# Patient Record
Sex: Male | Born: 1990 | Race: White | Hispanic: No | Marital: Single | Smoking: Current some day smoker
Health system: Southern US, Community
[De-identification: ages and names within clinical notes are randomized; demographics above are authoritative.]

## PROBLEM LIST (undated history)

## (undated) DIAGNOSIS — F1910 Other psychoactive substance abuse, uncomplicated: Secondary | ICD-10-CM

---
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
## 2022-01-08 ENCOUNTER — Emergency Department (HOSPITAL_COMMUNITY): Payer: Medicaid Other

## 2022-01-08 ENCOUNTER — Inpatient Hospital Stay (HOSPITAL_COMMUNITY)
Admission: EM | Admit: 2022-01-08 | Discharge: 2022-01-23 | DRG: 853 | Disposition: A | Discharge status: Home Health | Payer: Medicaid Other | Attending: Internal Medicine | Admitting: Internal Medicine

## 2022-01-08 ENCOUNTER — Inpatient Hospital Stay (HOSPITAL_COMMUNITY): Payer: Medicaid Other | Admitting: Certified Registered Nurse Anesthetist

## 2022-01-08 ENCOUNTER — Inpatient Hospital Stay (HOSPITAL_COMMUNITY): Payer: Medicaid Other

## 2022-01-08 ENCOUNTER — Other Ambulatory Visit: Payer: Self-pay

## 2022-01-08 ENCOUNTER — Encounter (HOSPITAL_COMMUNITY): Payer: Self-pay

## 2022-01-08 ENCOUNTER — Encounter (HOSPITAL_COMMUNITY)
Admission: EM | Disposition: A | Discharge status: Home Health | Payer: Self-pay | Source: Home / Self Care | Attending: Internal Medicine

## 2022-01-08 DIAGNOSIS — I2489 Other forms of acute ischemic heart disease: Secondary | ICD-10-CM | POA: Diagnosis present

## 2022-01-08 DIAGNOSIS — R6510 Systemic inflammatory response syndrome (SIRS) of non-infectious origin without acute organ dysfunction: Secondary | ICD-10-CM | POA: Diagnosis present

## 2022-01-08 DIAGNOSIS — M79A11 Nontraumatic compartment syndrome of right upper extremity: Secondary | ICD-10-CM | POA: Diagnosis not present

## 2022-01-08 DIAGNOSIS — M6282 Rhabdomyolysis: Secondary | ICD-10-CM | POA: Diagnosis not present

## 2022-01-08 DIAGNOSIS — R778 Other specified abnormalities of plasma proteins: Secondary | ICD-10-CM | POA: Diagnosis present

## 2022-01-08 DIAGNOSIS — E875 Hyperkalemia: Secondary | ICD-10-CM | POA: Diagnosis not present

## 2022-01-08 DIAGNOSIS — R7401 Elevation of levels of liver transaminase levels: Secondary | ICD-10-CM | POA: Diagnosis not present

## 2022-01-08 DIAGNOSIS — G928 Other toxic encephalopathy: Secondary | ICD-10-CM | POA: Diagnosis present

## 2022-01-08 DIAGNOSIS — G9341 Metabolic encephalopathy: Secondary | ICD-10-CM | POA: Diagnosis present

## 2022-01-08 DIAGNOSIS — E8809 Other disorders of plasma-protein metabolism, not elsewhere classified: Secondary | ICD-10-CM | POA: Diagnosis present

## 2022-01-08 DIAGNOSIS — N179 Acute kidney failure, unspecified: Secondary | ICD-10-CM | POA: Diagnosis not present

## 2022-01-08 DIAGNOSIS — F1910 Other psychoactive substance abuse, uncomplicated: Secondary | ICD-10-CM | POA: Diagnosis not present

## 2022-01-08 DIAGNOSIS — A419 Sepsis, unspecified organism: Principal | ICD-10-CM | POA: Diagnosis present

## 2022-01-08 DIAGNOSIS — N5089 Other specified disorders of the male genital organs: Secondary | ICD-10-CM | POA: Diagnosis present

## 2022-01-08 DIAGNOSIS — E872 Acidosis, unspecified: Secondary | ICD-10-CM | POA: Diagnosis not present

## 2022-01-08 DIAGNOSIS — L03119 Cellulitis of unspecified part of limb: Secondary | ICD-10-CM

## 2022-01-08 DIAGNOSIS — Y838 Other surgical procedures as the cause of abnormal reaction of the patient, or of later complication, without mention of misadventure at the time of the procedure: Secondary | ICD-10-CM | POA: Diagnosis not present

## 2022-01-08 DIAGNOSIS — J690 Pneumonitis due to inhalation of food and vomit: Secondary | ICD-10-CM | POA: Diagnosis present

## 2022-01-08 DIAGNOSIS — T40411A Poisoning by fentanyl or fentanyl analogs, accidental (unintentional), initial encounter: Secondary | ICD-10-CM | POA: Diagnosis present

## 2022-01-08 DIAGNOSIS — E8770 Fluid overload, unspecified: Secondary | ICD-10-CM | POA: Diagnosis present

## 2022-01-08 DIAGNOSIS — F17210 Nicotine dependence, cigarettes, uncomplicated: Secondary | ICD-10-CM

## 2022-01-08 DIAGNOSIS — N289 Disorder of kidney and ureter, unspecified: Secondary | ICD-10-CM

## 2022-01-08 DIAGNOSIS — E876 Hypokalemia: Secondary | ICD-10-CM | POA: Diagnosis present

## 2022-01-08 DIAGNOSIS — T79A11A Traumatic compartment syndrome of right upper extremity, initial encounter: Secondary | ICD-10-CM

## 2022-01-08 DIAGNOSIS — E871 Hypo-osmolality and hyponatremia: Secondary | ICD-10-CM | POA: Diagnosis present

## 2022-01-08 DIAGNOSIS — J40 Bronchitis, not specified as acute or chronic: Secondary | ICD-10-CM | POA: Diagnosis present

## 2022-01-08 DIAGNOSIS — N170 Acute kidney failure with tubular necrosis: Secondary | ICD-10-CM | POA: Diagnosis present

## 2022-01-08 DIAGNOSIS — D649 Anemia, unspecified: Secondary | ICD-10-CM | POA: Diagnosis present

## 2022-01-08 DIAGNOSIS — Y92009 Unspecified place in unspecified non-institutional (private) residence as the place of occurrence of the external cause: Secondary | ICD-10-CM

## 2022-01-08 DIAGNOSIS — R030 Elevated blood-pressure reading, without diagnosis of hypertension: Secondary | ICD-10-CM | POA: Diagnosis not present

## 2022-01-08 DIAGNOSIS — B1920 Unspecified viral hepatitis C without hepatic coma: Secondary | ICD-10-CM | POA: Diagnosis present

## 2022-01-08 DIAGNOSIS — I9752 Accidental puncture and laceration of a circulatory system organ or structure during other procedure: Secondary | ICD-10-CM | POA: Diagnosis not present

## 2022-01-08 DIAGNOSIS — R509 Fever, unspecified: Secondary | ICD-10-CM | POA: Diagnosis not present

## 2022-01-08 DIAGNOSIS — R7989 Other specified abnormal findings of blood chemistry: Secondary | ICD-10-CM | POA: Diagnosis present

## 2022-01-08 DIAGNOSIS — M79604 Pain in right leg: Secondary | ICD-10-CM | POA: Diagnosis not present

## 2022-01-08 DIAGNOSIS — Z1152 Encounter for screening for COVID-19: Secondary | ICD-10-CM

## 2022-01-08 DIAGNOSIS — L03115 Cellulitis of right lower limb: Secondary | ICD-10-CM | POA: Diagnosis present

## 2022-01-08 DIAGNOSIS — I248 Other forms of acute ischemic heart disease: Secondary | ICD-10-CM | POA: Diagnosis not present

## 2022-01-08 DIAGNOSIS — S41101A Unspecified open wound of right upper arm, initial encounter: Secondary | ICD-10-CM | POA: Diagnosis not present

## 2022-01-08 DIAGNOSIS — T79A0XA Compartment syndrome, unspecified, initial encounter: Secondary | ICD-10-CM | POA: Diagnosis present

## 2022-01-08 DIAGNOSIS — F1990 Other psychoactive substance use, unspecified, uncomplicated: Secondary | ICD-10-CM

## 2022-01-08 DIAGNOSIS — R29898 Other symptoms and signs involving the musculoskeletal system: Secondary | ICD-10-CM | POA: Diagnosis not present

## 2022-01-08 DIAGNOSIS — S60428A Blister (nonthermal) of other finger, initial encounter: Secondary | ICD-10-CM | POA: Diagnosis present

## 2022-01-08 HISTORY — DX: Other psychoactive substance abuse, uncomplicated: F19.10

## 2022-01-08 HISTORY — PX: FASCIECTOMY: SHX6525

## 2022-01-08 LAB — CBC WITH DIFFERENTIAL/PLATELET
Abs Immature Granulocytes: 0.62 10*3/uL — ABNORMAL HIGH (ref 0.00–0.07)
Basophils Absolute: 0.1 10*3/uL (ref 0.0–0.1)
Basophils Relative: 0 %
Eosinophils Absolute: 0 10*3/uL (ref 0.0–0.5)
Eosinophils Relative: 0 %
HCT: 54.9 % — ABNORMAL HIGH (ref 39.0–52.0)
Hemoglobin: 19 g/dL — ABNORMAL HIGH (ref 13.0–17.0)
Immature Granulocytes: 2 %
Lymphocytes Relative: 2 %
Lymphs Abs: 0.6 10*3/uL — ABNORMAL LOW (ref 0.7–4.0)
MCH: 30.9 pg (ref 26.0–34.0)
MCHC: 34.6 g/dL (ref 30.0–36.0)
MCV: 89.4 fL (ref 80.0–100.0)
Monocytes Absolute: 1.3 10*3/uL — ABNORMAL HIGH (ref 0.1–1.0)
Monocytes Relative: 5 %
Neutro Abs: 23.6 10*3/uL — ABNORMAL HIGH (ref 1.7–7.7)
Neutrophils Relative %: 91 %
Platelets: 290 10*3/uL (ref 150–400)
RBC: 6.14 MIL/uL — ABNORMAL HIGH (ref 4.22–5.81)
RDW: 13.1 % (ref 11.5–15.5)
WBC Morphology: INCREASED
WBC: 26.2 10*3/uL — ABNORMAL HIGH (ref 4.0–10.5)
nRBC: 0 % (ref 0.0–0.2)

## 2022-01-08 LAB — COMPREHENSIVE METABOLIC PANEL
ALT: 318 U/L — ABNORMAL HIGH (ref 0–44)
AST: 1191 U/L — ABNORMAL HIGH (ref 15–41)
Albumin: 3.4 g/dL — ABNORMAL LOW (ref 3.5–5.0)
Alkaline Phosphatase: 153 U/L — ABNORMAL HIGH (ref 38–126)
Anion gap: 12 (ref 5–15)
BUN: 42 mg/dL — ABNORMAL HIGH (ref 6–20)
CO2: 16 mmol/L — ABNORMAL LOW (ref 22–32)
Calcium: 7.9 mg/dL — ABNORMAL LOW (ref 8.9–10.3)
Chloride: 106 mmol/L (ref 98–111)
Creatinine, Ser: 1.83 mg/dL — ABNORMAL HIGH (ref 0.61–1.24)
GFR, Estimated: 51 mL/min — ABNORMAL LOW (ref >60)
Glucose, Bld: 147 mg/dL — ABNORMAL HIGH (ref 70–99)
Potassium: 6.1 mmol/L — ABNORMAL HIGH (ref 3.5–5.1)
Sodium: 134 mmol/L — ABNORMAL LOW (ref 135–145)
Total Bilirubin: 0.7 mg/dL (ref 0.3–1.2)
Total Protein: 7.8 g/dL (ref 6.5–8.1)

## 2022-01-08 LAB — POCT I-STAT 7, (LYTES, BLD GAS, ICA,H+H)
Acid-base deficit: 6 mmol/L — ABNORMAL HIGH (ref 0.0–2.0)
Bicarbonate: 20.1 mmol/L (ref 20.0–28.0)
Calcium, Ion: 1.06 mmol/L — ABNORMAL LOW (ref 1.15–1.40)
HCT: 43 % (ref 39.0–52.0)
Hemoglobin: 14.6 g/dL (ref 13.0–17.0)
O2 Saturation: 99 %
Potassium: 5.1 mmol/L (ref 3.5–5.1)
Sodium: 135 mmol/L (ref 135–145)
TCO2: 21 mmol/L — ABNORMAL LOW (ref 22–32)
pCO2 arterial: 41.6 mmHg (ref 32–48)
pH, Arterial: 7.292 — ABNORMAL LOW (ref 7.35–7.45)
pO2, Arterial: 158 mmHg — ABNORMAL HIGH (ref 83–108)

## 2022-01-08 LAB — URINALYSIS, ROUTINE W REFLEX MICROSCOPIC
Bilirubin Urine: NEGATIVE
Glucose, UA: NEGATIVE mg/dL
Ketones, ur: NEGATIVE mg/dL
Leukocytes,Ua: NEGATIVE
Nitrite: NEGATIVE
Protein, ur: 100 mg/dL — AB
Specific Gravity, Urine: 1.018 (ref 1.005–1.030)
pH: 6 (ref 5.0–8.0)

## 2022-01-08 LAB — GLUCOSE, CAPILLARY: Glucose-Capillary: 144 mg/dL — ABNORMAL HIGH (ref 70–99)

## 2022-01-08 LAB — APTT: aPTT: 27 seconds (ref 24–36)

## 2022-01-08 LAB — RAPID URINE DRUG SCREEN, HOSP PERFORMED
Amphetamines: NOT DETECTED
Barbiturates: NOT DETECTED
Benzodiazepines: NOT DETECTED
Cocaine: NOT DETECTED
Opiates: NOT DETECTED
Tetrahydrocannabinol: NOT DETECTED

## 2022-01-08 LAB — ETHANOL: Alcohol, Ethyl (B): <10 mg/dL (ref <10)

## 2022-01-08 LAB — BLOOD GAS, VENOUS
Acid-base deficit: 6.8 mmol/L — ABNORMAL HIGH (ref 0.0–2.0)
Bicarbonate: 16.9 mmol/L — ABNORMAL LOW (ref 20.0–28.0)
O2 Saturation: 94.8 %
Patient temperature: 37
pCO2, Ven: 30 mmHg — ABNORMAL LOW (ref 44–60)
pH, Ven: 7.36 (ref 7.25–7.43)
pO2, Ven: 68 mmHg — ABNORMAL HIGH (ref 32–45)

## 2022-01-08 LAB — LACTIC ACID, PLASMA
Lactic Acid, Venous: 4.1 mmol/L (ref 0.5–1.9)
Lactic Acid, Venous: 2.9 mmol/L (ref 0.5–1.9)

## 2022-01-08 LAB — RESP PANEL BY RT-PCR (FLU A&B, COVID) ARPGX2
Influenza A by PCR: NEGATIVE
Influenza B by PCR: NEGATIVE
SARS Coronavirus 2 by RT PCR: NEGATIVE

## 2022-01-08 LAB — TROPONIN I (HIGH SENSITIVITY)
Troponin I (High Sensitivity): 6918 ng/L (ref <18)
Troponin I (High Sensitivity): 9935 ng/L (ref <18)

## 2022-01-08 LAB — CBG MONITORING, ED: Glucose-Capillary: 119 mg/dL — ABNORMAL HIGH (ref 70–99)

## 2022-01-08 LAB — PROTIME-INR
INR: 1.2 (ref 0.8–1.2)
Prothrombin Time: 15.4 seconds — ABNORMAL HIGH (ref 11.4–15.2)

## 2022-01-08 LAB — CK: Total CK: >50000 U/L — ABNORMAL HIGH (ref 49–397)

## 2022-01-08 LAB — MAGNESIUM: Magnesium: 2 mg/dL (ref 1.7–2.4)

## 2022-01-08 LAB — LIPASE, BLOOD: Lipase: 35 U/L (ref 11–51)

## 2022-01-08 SURGERY — FASCIECTOMY, PALM
Anesthesia: Monitor Anesthesia Care | Site: Arm Lower | Laterality: Right

## 2022-01-08 SURGERY — Surgical Case
Anesthesia: *Unknown

## 2022-01-08 MED ORDER — LACTATED RINGERS IV SOLN: INTRAVENOUS | Status: DC

## 2022-01-08 MED ORDER — SODIUM CHLORIDE (PF) 0.9 % IJ SOLN
INTRAMUSCULAR | Status: AC
  Filled 2022-01-08: qty 50

## 2022-01-08 MED ORDER — SODIUM CHLORIDE 0.9 % IV SOLN
2.0000 g | Freq: Once | INTRAVENOUS | Status: AC
  Administered 2022-01-08: 2 g via INTRAVENOUS
  Filled 2022-01-08: qty 12.5

## 2022-01-08 MED ORDER — DEXTROSE 50 % IV SOLN
INTRAVENOUS | Status: AC
  Filled 2022-01-08: qty 50

## 2022-01-08 MED ORDER — INSULIN ASPART 100 UNIT/ML IJ SOLN
5.0000 [IU] | Freq: Once | INTRAMUSCULAR | Status: AC
  Administered 2022-01-08: 5 [IU] via INTRAVENOUS
  Filled 2022-01-08: qty 0.05

## 2022-01-08 MED ORDER — SODIUM ZIRCONIUM CYCLOSILICATE 10 G PO PACK
10.0000 g | PACK | Freq: Once | ORAL | Status: AC
  Administered 2022-01-08: 10 g via ORAL
  Filled 2022-01-08: qty 1

## 2022-01-08 MED ORDER — PHENYLEPHRINE HCL-NACL 20-0.9 MG/250ML-% IV SOLN
INTRAVENOUS | Status: AC
  Filled 2022-01-08: qty 250

## 2022-01-08 MED ORDER — OXYCODONE HCL 5 MG PO TABS: 5.0000 mg | ORAL_TABLET | Freq: Once | ORAL | Status: DC | PRN

## 2022-01-08 MED ORDER — LACTATED RINGERS IV BOLUS
1000.0000 mL | Freq: Once | INTRAVENOUS | Status: AC
  Administered 2022-01-08: 1000 mL via INTRAVENOUS

## 2022-01-08 MED ORDER — SODIUM CHLORIDE 0.9 % IV SOLN: Freq: Once | INTRAVENOUS | Status: AC

## 2022-01-08 MED ORDER — PROPOFOL 500 MG/50ML IV EMUL
INTRAVENOUS | Status: DC | PRN
  Administered 2022-01-08: 50 ug/kg/min via INTRAVENOUS

## 2022-01-08 MED ORDER — METRONIDAZOLE 500 MG/100ML IV SOLN
500.0000 mg | Freq: Once | INTRAVENOUS | Status: AC
  Administered 2022-01-08: 500 mg via INTRAVENOUS
  Filled 2022-01-08: qty 100

## 2022-01-08 MED ORDER — ACETAMINOPHEN 325 MG PO TABS
650.0000 mg | ORAL_TABLET | Freq: Four times a day (QID) | ORAL | Status: DC | PRN
  Administered 2022-01-10 – 2022-01-21 (×10): 650 mg via ORAL
  Filled 2022-01-08 (×11): qty 2

## 2022-01-08 MED ORDER — DEXMEDETOMIDINE HCL IN NACL 200 MCG/50ML IV SOLN
0.4000 ug/kg/h | INTRAVENOUS | Status: DC
  Administered 2022-01-08: .6 ug/kg/h via INTRAVENOUS
  Filled 2022-01-08: qty 50

## 2022-01-08 MED ORDER — INSULIN ASPART 100 UNIT/ML IJ SOLN
INTRAMUSCULAR | Status: AC
  Filled 2022-01-08: qty 1

## 2022-01-08 MED ORDER — HYDROMORPHONE HCL 1 MG/ML IJ SOLN: 0.2500 mg | INTRAMUSCULAR | Status: DC | PRN

## 2022-01-08 MED ORDER — OXYCODONE HCL 5 MG/5ML PO SOLN: 5.0000 mg | Freq: Once | ORAL | Status: DC | PRN

## 2022-01-08 MED ORDER — LACTATED RINGERS IV BOLUS (SEPSIS)
2500.0000 mL | Freq: Once | INTRAVENOUS | Status: AC
  Administered 2022-01-08: 2500 mL via INTRAVENOUS

## 2022-01-08 MED ORDER — NALOXONE HCL 0.4 MG/ML IJ SOLN: 0.4000 mg | INTRAMUSCULAR | Status: DC | PRN

## 2022-01-08 MED ORDER — ONDANSETRON HCL 4 MG/2ML IJ SOLN: 4.0000 mg | Freq: Four times a day (QID) | INTRAMUSCULAR | Status: DC | PRN

## 2022-01-08 MED ORDER — LIDOCAINE-EPINEPHRINE (PF) 1.5 %-1:200000 IJ SOLN
INTRAMUSCULAR | Status: DC | PRN
  Administered 2022-01-08: 20 mL via PERINEURAL

## 2022-01-08 MED ORDER — SODIUM CHLORIDE 0.9 % IR SOLN
Status: DC | PRN
  Administered 2022-01-08: 1000 mL

## 2022-01-08 MED ORDER — ACETAMINOPHEN 650 MG RE SUPP: 650.0000 mg | Freq: Four times a day (QID) | RECTAL | Status: DC | PRN

## 2022-01-08 MED ORDER — KETAMINE HCL 10 MG/ML IJ SOLN
INTRAMUSCULAR | Status: DC | PRN
  Administered 2022-01-08: 30 mg via INTRAVENOUS

## 2022-01-08 MED ORDER — PROPOFOL 500 MG/50ML IV EMUL
INTRAVENOUS | Status: AC
  Filled 2022-01-08: qty 50

## 2022-01-08 MED ORDER — IOHEXOL 350 MG/ML SOLN
75.0000 mL | Freq: Once | INTRAVENOUS | Status: AC | PRN
  Administered 2022-01-08: 75 mL via INTRAVENOUS

## 2022-01-08 MED ORDER — HYDROMORPHONE HCL 1 MG/ML IJ SOLN
0.5000 mg | INTRAMUSCULAR | Status: DC | PRN
  Administered 2022-01-09 (×2): 0.5 mg via INTRAVENOUS
  Filled 2022-01-08 (×3): qty 1

## 2022-01-08 MED ORDER — FENTANYL CITRATE PF 50 MCG/ML IJ SOSY
PREFILLED_SYRINGE | INTRAMUSCULAR | Status: AC
  Filled 2022-01-08: qty 2

## 2022-01-08 MED ORDER — PROPOFOL 10 MG/ML IV BOLUS
INTRAVENOUS | Status: AC
  Filled 2022-01-08: qty 20

## 2022-01-08 MED ORDER — DEXTROSE 50 % IV SOLN
2.0000 | Freq: Once | INTRAVENOUS | Status: AC
  Administered 2022-01-08: 100 mL via INTRAVENOUS
  Filled 2022-01-08: qty 100

## 2022-01-08 MED ORDER — VANCOMYCIN HCL IN DEXTROSE 1-5 GM/200ML-% IV SOLN
1000.0000 mg | Freq: Once | INTRAVENOUS | Status: AC
  Administered 2022-01-08: 1000 mg via INTRAVENOUS
  Filled 2022-01-08: qty 200

## 2022-01-08 MED ORDER — DEXMEDETOMIDINE HCL IN NACL 80 MCG/20ML IV SOLN
INTRAVENOUS | Status: DC | PRN
  Administered 2022-01-08 (×4): 8 ug via BUCCAL

## 2022-01-08 MED ORDER — AMISULPRIDE (ANTIEMETIC) 5 MG/2ML IV SOLN: 10.0000 mg | Freq: Once | INTRAVENOUS | Status: DC | PRN

## 2022-01-08 MED ORDER — KETAMINE HCL 10 MG/ML IJ SOLN
INTRAMUSCULAR | Status: AC
  Filled 2022-01-08: qty 1

## 2022-01-08 MED ORDER — SODIUM CHLORIDE 0.9 % IV BOLUS
1000.0000 mL | Freq: Once | INTRAVENOUS | Status: AC
  Administered 2022-01-08: 1000 mL via INTRAVENOUS

## 2022-01-08 MED ORDER — ONDANSETRON HCL 4 MG/2ML IJ SOLN: 4.0000 mg | Freq: Once | INTRAMUSCULAR | Status: DC | PRN

## 2022-01-08 MED ORDER — FENTANYL CITRATE (PF) 100 MCG/2ML IJ SOLN
INTRAMUSCULAR | Status: DC | PRN
  Administered 2022-01-08 (×2): 50 ug via INTRAVENOUS

## 2022-01-08 SURGICAL SUPPLY — 38 items
BLADE SURG 15 STRL LF DISP TIS (BLADE) ×1 IMPLANT
BLADE SURG 15 STRL SS (BLADE) ×1
BNDG ELASTIC 4X5.8 VLCR STR LF (GAUZE/BANDAGES/DRESSINGS) IMPLANT
BNDG ESMARK 4X9 LF (GAUZE/BANDAGES/DRESSINGS) ×1 IMPLANT
BNDG GAUZE DERMACEA FLUFF 4 (GAUZE/BANDAGES/DRESSINGS) ×1 IMPLANT
BNDG PLASTER X FAST 3X3 WHT LF (CAST SUPPLIES) IMPLANT
CHLORAPREP W/TINT 26 (MISCELLANEOUS) ×1 IMPLANT
CORD BIPOLAR FORCEPS 12FT (ELECTRODE) ×1 IMPLANT
COVER BACK TABLE 60X90IN (DRAPES) ×1 IMPLANT
COVER MAYO STAND STRL (DRAPES) ×1 IMPLANT
CUFF TOURN SGL QUICK 18X4 (TOURNIQUET CUFF) IMPLANT
DRAPE EXTREMITY T 121X128X90 (DISPOSABLE) ×1 IMPLANT
DRAPE SURG 17X23 STRL (DRAPES) ×1 IMPLANT
DRSG ADAPTIC 3X8 NADH LF (GAUZE/BANDAGES/DRESSINGS) IMPLANT
DRSG EMULSION OIL 3X16 NADH (GAUZE/BANDAGES/DRESSINGS) IMPLANT
DRSG VAC GRANUFOAM LG (GAUZE/BANDAGES/DRESSINGS) IMPLANT
GAUZE SPONGE 4X4 12PLY STRL (GAUZE/BANDAGES/DRESSINGS) IMPLANT
GLOVE BIO SURGEON STRL SZ7 (GLOVE) ×1 IMPLANT
GLOVE BIOGEL PI IND STRL 7.0 (GLOVE) ×1 IMPLANT
GOWN STRL REUS W/ TWL XL LVL3 (GOWN DISPOSABLE) ×1 IMPLANT
GOWN STRL REUS W/TWL XL LVL3 (GOWN DISPOSABLE) ×3
NDL HYPO 25X1 1.5 SAFETY (NEEDLE) IMPLANT
NEEDLE HYPO 25X1 1.5 SAFETY (NEEDLE) IMPLANT
PACK BASIN DAY SURGERY FS (CUSTOM PROCEDURE TRAY) ×1 IMPLANT
PAD CAST 3X4 CTTN HI CHSV (CAST SUPPLIES) IMPLANT
PAD CAST 4YDX4 CTTN HI CHSV (CAST SUPPLIES) IMPLANT
PADDING CAST COTTON 3X4 STRL (CAST SUPPLIES)
PADDING CAST COTTON 4X4 STRL (CAST SUPPLIES) ×1
SLEEVE SCD COMPRESS KNEE MED (STOCKING) IMPLANT
SPLINT FIBERGLASS 3X35 (CAST SUPPLIES) ×1 IMPLANT
SUT ETHILON 2 0 PSLX (SUTURE) IMPLANT
SUT ETHILON 4 0 PS 2 18 (SUTURE) ×1 IMPLANT
SUT MNCRL AB 3-0 PS2 18 (SUTURE) IMPLANT
SUT VICRYL 4-0 PS2 18IN ABS (SUTURE) IMPLANT
SYR BULB EAR ULCER 3OZ GRN STR (SYRINGE) ×1 IMPLANT
SYR CONTROL 10ML LL (SYRINGE) IMPLANT
TOWEL GREEN STERILE FF (TOWEL DISPOSABLE) ×2 IMPLANT
UNDERPAD 30X36 HEAVY ABSORB (UNDERPADS AND DIAPERS) ×1 IMPLANT

## 2022-01-08 NOTE — Anesthesia Postprocedure Evaluation (Signed)
Anesthesia Post Note  Patient: Filbert Craze  Procedure(s) Performed: FASCIECTOMY OF FOREARM AND HAND (Right: Arm Lower)     Patient location during evaluation: PACU Anesthesia Type: MAC and Regional Level of consciousness: awake and alert Pain management: pain level controlled Vital Signs Assessment: post-procedure vital signs reviewed and stable Respiratory status: spontaneous breathing, nonlabored ventilation and respiratory function stable Cardiovascular status: blood pressure returned to baseline and stable Postop Assessment: no apparent nausea or vomiting Anesthetic complications: no   No notable events documented.  Last Vitals:  Vitals:   01/08/22 2145 01/08/22 2345  BP: (!) 161/95 115/78  Pulse: (!) 108 82  Resp: (!) 24 17  Temp:  36.8 C  SpO2: 97% 99%    Last Pain:  Vitals:   01/08/22 1656  TempSrc: Oral  PainSc:                  Pervis Hocking

## 2022-01-08 NOTE — Transfer of Care (Signed)
Immediate Anesthesia Transfer of Care Note  Patient: Chris Parker  Procedure(s) Performed: FASCIECTOMY OF FOREARM AND HAND (Right: Arm Lower)  Patient Location: PACU  Anesthesia Type:MAC combined with regional for post-op pain  Level of Consciousness: awake, drowsy and patient cooperative  Airway & Oxygen Therapy: Patient Spontanous Breathing and Patient connected to face mask oxygen  Post-op Assessment: Report given to RN and Post -op Vital signs reviewed and stable  Post vital signs: Reviewed and stable  Last Vitals:  Vitals Value Taken Time  BP 115/78 01/08/22 2341  Temp    Pulse 85 01/08/22 2343  Resp 19 01/08/22 2343  SpO2 99 % 01/08/22 2343  Vitals shown include unvalidated device data.  Last Pain:  Vitals:   01/08/22 1656  TempSrc: Oral  PainSc:          Complications: No notable events documented.

## 2022-01-08 NOTE — Anesthesia Procedure Notes (Signed)
Anesthesia Regional Block: Supraclavicular block   Pre-Anesthetic Checklist: , timeout performed,  Correct Patient, Correct Site, Correct Laterality,  Correct Procedure, Correct Position, site marked,  Risks and benefits discussed,  Surgical consent,  Pre-op evaluation,  At surgeon's request and post-op pain management  Laterality: Right  Prep: Maximum Sterile Barrier Precautions used, chloraprep       Needles:  Injection technique: Single-shot  Needle Type: Echogenic Stimulator Needle     Needle Length: 9cm  Needle Gauge: 22     Additional Needles:   Procedures:,,,, ultrasound used (permanent image in chart),,    Narrative:  Start time: 01/08/2022 9:51 PM End time: 01/08/2022 9:56 PM Injection made incrementally with aspirations every 5 mL.  Performed by: Personally  Anesthesiologist: Pervis Hocking, DO  Additional Notes: Monitors applied. No increased pain on injection. No increased resistance to injection. Injection made in 5cc increments. Good needle visualization. Patient tolerated procedure well.

## 2022-01-08 NOTE — Anesthesia Procedure Notes (Signed)
Arterial Line Insertion Start/End9/22/2023 10:00 PM, 01/08/2022 10:05 PM Performed by: Pervis Hocking, DO, anesthesiologist  Patient location: OR. Preanesthetic checklist: patient identified, IV checked, site marked, risks and benefits discussed, surgical consent, monitors and equipment checked, pre-op evaluation, timeout performed and anesthesia consent Lidocaine 1% used for infiltration Left, radial was placed Catheter size: 20 G Hand hygiene performed  and maximum sterile barriers used   Attempts: 1 Procedure performed without using ultrasound guided technique. Following insertion, dressing applied. Post procedure assessment: normal and unchanged  Patient tolerated the procedure well with no immediate complications.

## 2022-01-08 NOTE — Sepsis Progress Note (Signed)
Notified bedside nurse of need to draw repeat lactic acid. 

## 2022-01-08 NOTE — Interval H&P Note (Signed)
History and Physical Interval Note:  01/08/2022 9:58 PM  Chris Parker  has presented today for surgery, with the diagnosis of COMPARTMENT SYNDROME  RIGHT ARM.  The various methods of treatment have been discussed with the patient and family. After consideration of risks, benefits and other options for treatment, the patient has consented to  Procedure(s) with comments: FASCIECTOMY (Right) - RIGHT HAND AND FOREARM as a surgical intervention.  The patient's history has been reviewed, patient examined, no change in status, stable for surgery.  I have reviewed the patient's chart and labs.  Questions were answered to the patient's satisfaction.     Amauri Keefe Tyden Kann
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
## 2022-01-08 NOTE — H&P (View-Only) (Signed)
 HAND SURGERY CONSULTATION  REQUESTING PHYSICIAN: Schlossman, Erin, MD   Chief Complaint: Unable to obtain from patient  HPI: Chris Parker is a 31 y.o. male who presents with pain, swelling, and erythema involving the right forearm and hand.  Patient was reportedly found down by his family and brought to the [facility] ER by EMS.  By report, it sounds like he may have overdosed and was lying on the right side of his body for an unknown period of time. His mother saw him at 11PM on Thursday evening and found him down around 2 PM this afternoon. He is significantly altered in the ER and does not follow commands or provide any history.  Hand dominance: Unknown Occupation: Unknown  History reviewed. No pertinent past medical history. History reviewed. No pertinent surgical history. Social History   Socioeconomic History   Marital status: Single    Spouse name: Not on file   Number of children: Not on file   Years of education: Not on file   Highest education level: Not on file  Occupational History   Not on file  Tobacco Use   Smoking status: Not on file   Smokeless tobacco: Not on file  Substance and Sexual Activity   Alcohol use: Not on file   Drug use: Not on file   Sexual activity: Not on file  Other Topics Concern   Not on file  Social History Narrative   Not on file   Social Determinants of Health   Financial Resource Strain: Not on file  Food Insecurity: Not on file  Transportation Needs: Not on file  Physical Activity: Not on file  Stress: Not on file  Social Connections: Not on file   History reviewed. No pertinent family history. - negative except otherwise stated in the family history section No Known Allergies Prior to Admission medications   Not on File   DG Hand Complete Left  Result Date: 01/08/2022 CLINICAL DATA:  Hand swelling EXAM: LEFT HAND - COMPLETE 3+ VIEW COMPARISON:  None Available. FINDINGS: There is no evidence of fracture or dislocation. There is  no evidence of arthropathy or other focal bone abnormality. Soft tissues are unremarkable. IMPRESSION: Negative. Electronically Signed   By: Nicholas  Plundo D.O.   On: 01/08/2022 18:28   DG Hand Complete Right  Result Date: 01/08/2022 CLINICAL DATA:  Right hand swelling with cutaneous blisters EXAM: RIGHT HAND - COMPLETE 3+ VIEW COMPARISON:  None Available. FINDINGS: There is no evidence of fracture or dislocation. No erosion or periosteal elevation. There is no evidence of arthropathy or other focal bone abnormality. Generalized soft tissue swelling. No soft tissue gas. IMPRESSION: 1. No acute osseous abnormality. 2. Generalized soft tissue swelling. Electronically Signed   By: Nicholas  Plundo D.O.   On: 01/08/2022 18:28   DG Chest Port 1 View  Result Date: 01/08/2022 CLINICAL DATA:  Possible sepsis.  Overdose EXAM: PORTABLE CHEST 1 VIEW COMPARISON:  None Available. FINDINGS: The heart size and mediastinal contours are within normal limits. Diffuse interstitial and alveolar opacities throughout the right lung. Relatively mild interstitial prominence within the left lung. No pleural effusion or pneumothorax. The visualized skeletal structures are unremarkable. IMPRESSION: Diffuse airspace opacities throughout the right lung with mild interstitial prominence within the left lung. Findings may represent multifocal pneumonia or asymmetric edema. Electronically Signed   By: Nicholas  Plundo D.O.   On: 01/08/2022 18:27   - Positive ROS: All other systems have been reviewed and were otherwise negative with the exception   of those mentioned in the HPI and as above.  Physical Exam: General: Agitated, lying on stretcher, not following commands or providing history Cardiovascular: BUE warm and well perfused, tachycardic Respiratory: Normal WOB on RA Skin: Warm and dry Neurologic: Unable to obtain   Right Upper Extremity Erythema involving ulnar aspect of forearm Blistering around dorsal and volar wrist,  patient was reportedly wearing a watch when he came in  Volar forearm is very tense Fingers held in claw position extended at MCP and flexed at PIP and DIP joints Will not actively move his fingers or wrist Unable to obtain sensory exam Hand warm and well perfused w/ BCR and bounding radial pulse    Assessment: 31 yo M w/ presumed right forearm and hand compartment after unknown event.  Might have been down for prolonged period secondary to overdose.   Plan: Will plan for emergent fasciotomies of the right forearm and hand as soon as possible.  Patient currently being worked up for potentially life threatening conditions.  Will discuss case with OR and send for patient as soon as he is stable.   Thank you for the consult and the opportunity to see Mr. Chris Parker, M.D. [facility] 7:29 PM

## 2022-01-08 NOTE — Sepsis Progress Note (Signed)
Confirmed via secure chat with bedside RN that blood culture was drawn before the antibiotic was given.
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
## 2022-01-08 NOTE — Progress Notes (Signed)
A consult was received from an ED physician for vancomycin and cefepime per pharmacy dosing.  The patient's profile has been reviewed for ht/wt/allergies/indication/available labs.   A one time order has been placed for vancomycin 1g and cefepime 2g.  Further antibiotics/pharmacy consults should be ordered by admitting physician if indicated.                       Thank you, Peggyann Juba, PharmD, BCPS 01/08/2022  5:17 PM
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
## 2022-01-08 NOTE — Brief Op Note (Signed)
01/08/2022  11:32 PM  PATIENT:  Chris Parker  31 y.o. male  PRE-OPERATIVE DIAGNOSIS:  COMPARTMENT SYNDROME  RIGHT ARM  POST-OPERATIVE DIAGNOSIS:  COMPARTMENT SYNDROME  RIGHT ARM  PROCEDURE:  Procedure(s) with comments: FASCIECTOMY OF FOREARM AND HAND (Right) - RIGHT HAND AND FOREARM  SURGEON:  Surgeon(s) and Role:    * Sherilyn Cooter, MD - Primary  PHYSICIAN ASSISTANT:   ASSISTANTS: none   ANESTHESIA:   regional and MAC  EBL:  10 mL   BLOOD ADMINISTERED:none  DRAINS:  Wound Vac, R forearm    LOCAL MEDICATIONS USED:  None   SPECIMEN:  No Specimen  DISPOSITION OF SPECIMEN:  N/A  COUNTS:  YES  TOURNIQUET:   Total Tourniquet Time Documented: Upper Arm (Right) - 56 minutes Total: Upper Arm (Right) - 56 minutes   DICTATION: .Viviann Spare Dictation  PLAN OF CARE: Discharge to home after PACU  PATIENT DISPOSITION:  PACU - hemodynamically stable.   Delay start of Pharmacological VTE agent (>24hrs) due to surgical blood loss or risk of bleeding: not applicable  Continue WVAC at 125 mmHg NWB RUE Will likely return to OR on Sunday or Monday for repeat I&D w/ possible closure versus repeat [facility]  Sherilyn Cooter, M.D. [facility]

## 2022-01-08 NOTE — Consult Note (Signed)
HAND SURGERY CONSULTATION  REQUESTING PHYSICIAN: Gareth Morgan, MD   Chief Complaint: Unable to obtain from patient  HPI: Chris Parker is a 31 y.o. male who presents with pain, swelling, and erythema involving the right forearm and hand.  Patient was reportedly found down by his family and brought to the [facility] ER by EMS.  By report, it sounds like he may have overdosed and was lying on the right side of his body for an unknown period of time. His mother saw him at 11PM on Thursday evening and found him down around 2 PM this afternoon. He is significantly altered in the ER and does not follow commands or provide any history.  Hand dominance: Unknown Occupation: Unknown  History reviewed. No pertinent past medical history. History reviewed. No pertinent surgical history. Social History   Socioeconomic History   Marital status: Single    Spouse name: Not on file   Number of children: Not on file   Years of education: Not on file   Highest education level: Not on file  Occupational History   Not on file  Tobacco Use   Smoking status: Not on file   Smokeless tobacco: Not on file  Substance and Sexual Activity   Alcohol use: Not on file   Drug use: Not on file   Sexual activity: Not on file  Other Topics Concern   Not on file  Social History Narrative   Not on file   Social Determinants of Health   Financial Resource Strain: Not on file  Food Insecurity: Not on file  Transportation Needs: Not on file  Physical Activity: Not on file  Stress: Not on file  Social Connections: Not on file   History reviewed. No pertinent family history. - negative except otherwise stated in the family history section No Known Allergies Prior to Admission medications   Not on File   DG Hand Complete Left  Result Date: 01/08/2022 CLINICAL DATA:  Hand swelling EXAM: LEFT HAND - COMPLETE 3+ VIEW COMPARISON:  None Available. FINDINGS: There is no evidence of fracture or dislocation. There is  no evidence of arthropathy or other focal bone abnormality. Soft tissues are unremarkable. IMPRESSION: Negative. Electronically Signed   By: Davina Poke D.O.   On: 01/08/2022 18:28   DG Hand Complete Right  Result Date: 01/08/2022 CLINICAL DATA:  Right hand swelling with cutaneous blisters EXAM: RIGHT HAND - COMPLETE 3+ VIEW COMPARISON:  None Available. FINDINGS: There is no evidence of fracture or dislocation. No erosion or periosteal elevation. There is no evidence of arthropathy or other focal bone abnormality. Generalized soft tissue swelling. No soft tissue gas. IMPRESSION: 1. No acute osseous abnormality. 2. Generalized soft tissue swelling. Electronically Signed   By: Davina Poke D.O.   On: 01/08/2022 18:28   DG Chest Port 1 View  Result Date: 01/08/2022 CLINICAL DATA:  Possible sepsis.  Overdose EXAM: PORTABLE CHEST 1 VIEW COMPARISON:  None Available. FINDINGS: The heart size and mediastinal contours are within normal limits. Diffuse interstitial and alveolar opacities throughout the right lung. Relatively mild interstitial prominence within the left lung. No pleural effusion or pneumothorax. The visualized skeletal structures are unremarkable. IMPRESSION: Diffuse airspace opacities throughout the right lung with mild interstitial prominence within the left lung. Findings may represent multifocal pneumonia or asymmetric edema. Electronically Signed   By: Davina Poke D.O.   On: 01/08/2022 18:27   - Positive ROS: All other systems have been reviewed and were otherwise negative with the exception  of those mentioned in the HPI and as above.  Physical Exam: General: Agitated, lying on stretcher, not following commands or providing history Cardiovascular: BUE warm and well perfused, tachycardic Respiratory: Normal WOB on RA Skin: Warm and dry Neurologic: Unable to obtain   Right Upper Extremity Erythema involving ulnar aspect of forearm Blistering around dorsal and volar wrist,  patient was reportedly wearing a watch when he came in  Volar forearm is very tense Fingers held in claw position extended at MCP and flexed at PIP and DIP joints Will not actively move his fingers or wrist Unable to obtain sensory exam Hand warm and well perfused w/ BCR and bounding radial pulse    Assessment: 31 yo M w/ presumed right forearm and hand compartment after unknown event.  Might have been down for prolonged period secondary to overdose.   Plan: Will plan for emergent fasciotomies of the right forearm and hand as soon as possible.  Patient currently being worked up for potentially life threatening conditions.  Will discuss case with OR and send for patient as soon as he is stable.   Thank you for the consult and the opportunity to see Mr. Chris Parker, M.D. [facility] 7:29 PM

## 2022-01-08 NOTE — ED Notes (Signed)
Family at bedside. 

## 2022-01-08 NOTE — H&P (View-Only) (Signed)
HAND SURGERY CONSULTATION  REQUESTING PHYSICIAN: Gareth Morgan, MD   Chief Complaint: Unable to obtain from patient  HPI: Chris Parker is a 31 y.o. male who presents with pain, swelling, and erythema involving the right forearm and hand.  Patient was reportedly found agitated and altered by his family and brought to the [facility] ER by EMS.  By report, it sounds like he may have overdosed and was lying on the right side of his body for an unknown period of time.  He is significantly altered in the ER and does not follow commands or provide any history.  Hand dominance: Unknown Occupation: Unknown  History reviewed. No pertinent past medical history. History reviewed. No pertinent surgical history. Social History   Socioeconomic History   Marital status: Single    Spouse name: Not on file   Number of children: Not on file   Years of education: Not on file   Highest education level: Not on file  Occupational History   Not on file  Tobacco Use   Smoking status: Not on file   Smokeless tobacco: Not on file  Substance and Sexual Activity   Alcohol use: Not on file   Drug use: Not on file   Sexual activity: Not on file  Other Topics Concern   Not on file  Social History Narrative   Not on file   Social Determinants of Health   Financial Resource Strain: Not on file  Food Insecurity: Not on file  Transportation Needs: Not on file  Physical Activity: Not on file  Stress: Not on file  Social Connections: Not on file   History reviewed. No pertinent family history. - negative except otherwise stated in the family history section No Known Allergies Prior to Admission medications   Not on File   DG Hand Complete Left  Result Date: 01/08/2022 CLINICAL DATA:  Hand swelling EXAM: LEFT HAND - COMPLETE 3+ VIEW COMPARISON:  None Available. FINDINGS: There is no evidence of fracture or dislocation. There is no evidence of arthropathy or other focal bone abnormality. Soft tissues are  unremarkable. IMPRESSION: Negative. Electronically Signed   By: Davina Poke D.O.   On: 01/08/2022 18:28   DG Hand Complete Right  Result Date: 01/08/2022 CLINICAL DATA:  Right hand swelling with cutaneous blisters EXAM: RIGHT HAND - COMPLETE 3+ VIEW COMPARISON:  None Available. FINDINGS: There is no evidence of fracture or dislocation. No erosion or periosteal elevation. There is no evidence of arthropathy or other focal bone abnormality. Generalized soft tissue swelling. No soft tissue gas. IMPRESSION: 1. No acute osseous abnormality. 2. Generalized soft tissue swelling. Electronically Signed   By: Davina Poke D.O.   On: 01/08/2022 18:28   DG Chest Port 1 View  Result Date: 01/08/2022 CLINICAL DATA:  Possible sepsis.  Overdose EXAM: PORTABLE CHEST 1 VIEW COMPARISON:  None Available. FINDINGS: The heart size and mediastinal contours are within normal limits. Diffuse interstitial and alveolar opacities throughout the right lung. Relatively mild interstitial prominence within the left lung. No pleural effusion or pneumothorax. The visualized skeletal structures are unremarkable. IMPRESSION: Diffuse airspace opacities throughout the right lung with mild interstitial prominence within the left lung. Findings may represent multifocal pneumonia or asymmetric edema. Electronically Signed   By: Davina Poke D.O.   On: 01/08/2022 18:27   - Positive ROS: All other systems have been reviewed and were otherwise negative with the exception of those mentioned in the HPI and as above.  Physical Exam: General: Agitated, lying  on stretcher, not following commands or providing history Cardiovascular: BUE warm and well perfused, tachycardic Respiratory: Normal WOB on RA Skin: Warm and dry Neurologic: Unable to obtain   Right Upper Extremity Erythema involving ulnar aspect of forearm Blistering around dorsal and volar wrist, patient was reportedly wearing a watch when he came in  Volar forearm is  very tense Fingers held in claw position extended at MCP and flexed at PIP and DIP joints Will not actively move his fingers or wrist Unable to obtain sensory exam Hand warm and well perfused w/ BCR and bounding radial pulse    Assessment: 31 yo M w/ presumed right forearm and hand compartment after unknown event.  Might have been down for prolonged period secondary to overdose.   Plan: Will plan for emergent fasciotomies of the right forearm and hand as soon as possible.  Patient currently being worked up for potentially life threatening conditions.  Will discuss case with OR and send for patient as soon as he is stable.   Thank you for the consult and the opportunity to see Chris Parker, M.D. [facility] 7:29 PM

## 2022-01-08 NOTE — H&P (Signed)
History and Physical    PLEASE NOTE THAT DRAGON DICTATION SOFTWARE WAS USED IN THE CONSTRUCTION OF THIS NOTE.   Dat Chris VHQ:[id number] DOB: 12/19/1990 DOA: 01/08/2022  PCP: Patient, No Pcp Per *** Patient coming from: home ***  I have personally briefly reviewed patient's old medical records in [facility]  Chief Complaint: ***  HPI: Chris Parker is a 31 y.o. male with medical history significant for *** who is admitted to [facility] on 01/08/2022 with *** after presenting from home*** to [facility] ED complaining of ***.    ***    ***SOB: Denies any associated orthopnea, PND, or new onset peripheral edema. No recent chest pain, diaphoresis, palpitations, N/Parker, pre-syncope, or syncope. Not associated with any recent cough, wheezing, hemoptysis, new lower extremity erythema, or calf tenderness. Denies any recent trauma, travel, surgical procedures, or periods of prolonged diminished ambulatory status. No recent melena or hematochezia.   Denies any associated subjective fever, chills, rigors, or generalized myalgias. No recent headache, neck stiffness, rhinitis, rhinorrhea, sore throat, abdominal pain, diarrhea, or rash. No known recent COVID-19 exposures. Denies dysuria, gross hematuria, or change in urinary urgency/frequency.  ***   ***misc/infectious: Denies any subjective fever, chills, rigors, or generalized myalgias. Denies any recent headache, neck stiffness, rhinitis, rhinorrhea, sore throat, sob, wheezing, cough, nausea, vomiting, abdominal pain, diarrhea, or rash. No recent traveling or known COVID-19 exposures. Denies dysuria, gross hematuria, or change in urinary urgency/frequency.  Denies any recent chest pain, diaphoresis, or palpitations. ***    ED Course:  Vital signs in the ED were notable for the following: ***  Labs were notable for the following: ***  Imaging and additional notable ED work-up: ***  While in the ED, the following were administered:  ***  Subsequently, the patient was admitted  ***  ***red   Review of Systems: As per HPI otherwise 10 point review of systems negative.   History reviewed. No pertinent past medical history.  History reviewed. No pertinent surgical history.  Social History:  has no history on file for tobacco use, alcohol use, and drug use.   No Known Allergies  History reviewed. No pertinent family history.  Family history reviewed and not pertinent ***   Prior to Admission medications   Not on File     Objective    Physical Exam: Vitals:   01/08/22 1656 01/08/22 1715 01/08/22 1836 01/08/22 1842  BP: (!) 135/93 (!) 152/101  138/88  Pulse:  (!) 133  (!) 122  Resp: 20 (!) 23  17  Temp: 100.1 F (37.8 C)     TempSrc: Oral     SpO2: 100% 100%  100%  Weight:   76.2 kg   Height:   5\' 9"  (1.753 m)     General: appears to be stated age; alert, oriented Skin: warm, dry, no rash Head:  AT/[place] Mouth:  Oral mucosa membranes appear moist, normal dentition Neck: supple; trachea midline Heart:  RRR; did not appreciate any M/R/G Lungs: CTAB, did not appreciate any wheezes, rales, or rhonchi Abdomen: + BS; soft, ND, NT Vascular: 2+ pedal pulses b/l; 2+ radial pulses b/l Extremities: no peripheral edema, no muscle wasting Neuro: strength and sensation intact in upper and lower extremities b/l    *** Neuro: 5/5 strength of the proximal and distal flexors and extensors of the upper and lower extremities bilaterally; sensation intact in upper and lower extremities b/l; cranial nerves II through XII grossly intact; no pronator drift; no evidence suggestive of slurred speech,  dysarthria, or facial droop; Normal muscle tone. No tremors. *** Neuro: In the setting of the patient's current mental status and associated inability to follow instructions, unable to perform full neurologic exam at this time.  As such, assessment of strength, sensation, and cranial nerves is limited at this time.  Patient noted to spontaneously move all 4 extremities. No tremors.  ***    Labs on Admission: I have personally reviewed following labs and imaging studies  CBC: Recent Labs  Lab 01/08/22 1739  WBC 26.2*  NEUTROABS 23.6*  HGB 19.0*  HCT 54.9*  MCV 89.4  PLT 290   Basic Metabolic Panel: Recent Labs  Lab 01/08/22 1739  NA 134*  K 6.1*  CL 106  CO2 16*  GLUCOSE 147*  BUN 42*  CREATININE 1.83*  CALCIUM 7.9*   GFR: Estimated Creatinine Clearance: 60.1 mL/min (A) (by C-G formula based on SCr of 1.83 mg/dL (H)). Liver Function Tests: Recent Labs  Lab 01/08/22 1739  AST 1,191*  ALT 318*  ALKPHOS 153*  BILITOT 0.7  PROT 7.8  ALBUMIN 3.4*   Recent Labs  Lab 01/08/22 1739  LIPASE 35   No results for input(s): "AMMONIA" in the last 168 hours. Coagulation Profile: Recent Labs  Lab 01/08/22 1739  INR 1.2   Cardiac Enzymes: Recent Labs  Lab 01/08/22 1739  CKTOTAL >50,000*   BNP (last 3 results) No results for input(s): "PROBNP" in the last 8760 hours. HbA1C: No results for input(s): "HGBA1C" in the last 72 hours. CBG: Recent Labs  Lab 01/08/22 1959  GLUCAP 119*   Lipid Profile: No results for input(s): "CHOL", "HDL", "LDLCALC", "TRIG", "CHOLHDL", "LDLDIRECT" in the last 72 hours. Thyroid Function Tests: No results for input(s): "TSH", "T4TOTAL", "FREET4", "T3FREE", "THYROIDAB" in the last 72 hours. Anemia Panel: No results for input(s): "VITAMINB12", "FOLATE", "FERRITIN", "TIBC", "IRON", "RETICCTPCT" in the last 72 hours. Urine analysis:    Component Value Date/Time   COLORURINE AMBER (A) 01/08/2022 1709   APPEARANCEUR HAZY (A) 01/08/2022 1709   LABSPEC 1.018 01/08/2022 1709   PHURINE 6.0 01/08/2022 1709   GLUCOSEU NEGATIVE 01/08/2022 1709   HGBUR LARGE (A) 01/08/2022 1709   BILIRUBINUR NEGATIVE 01/08/2022 1709   KETONESUR NEGATIVE 01/08/2022 1709   PROTEINUR 100 (A) 01/08/2022 1709   NITRITE NEGATIVE 01/08/2022 1709   LEUKOCYTESUR NEGATIVE  01/08/2022 1709    Radiological Exams on Admission: DG Hand Complete Left  Result Date: 01/08/2022 CLINICAL DATA:  Hand swelling EXAM: LEFT HAND - COMPLETE 3+ VIEW COMPARISON:  None Available. FINDINGS: There is no evidence of fracture or dislocation. There is no evidence of arthropathy or other focal bone abnormality. Soft tissues are unremarkable. IMPRESSION: Negative. Electronically Signed   By: Duanne Guess D.O.   On: 01/08/2022 18:28   DG Hand Complete Right  Result Date: 01/08/2022 CLINICAL DATA:  Right hand swelling with cutaneous blisters EXAM: RIGHT HAND - COMPLETE 3+ VIEW COMPARISON:  None Available. FINDINGS: There is no evidence of fracture or dislocation. No erosion or periosteal elevation. There is no evidence of arthropathy or other focal bone abnormality. Generalized soft tissue swelling. No soft tissue gas. IMPRESSION: 1. No acute osseous abnormality. 2. Generalized soft tissue swelling. Electronically Signed   By: Duanne Guess D.O.   On: 01/08/2022 18:28   DG Chest Port 1 View  Result Date: 01/08/2022 CLINICAL DATA:  Possible sepsis.  Overdose EXAM: PORTABLE CHEST 1 VIEW COMPARISON:  None Available. FINDINGS: The heart size and mediastinal contours are within normal limits. Diffuse interstitial  and alveolar opacities throughout the right lung. Relatively mild interstitial prominence within the left lung. No pleural effusion or pneumothorax. The visualized skeletal structures are unremarkable. IMPRESSION: Diffuse airspace opacities throughout the right lung with mild interstitial prominence within the left lung. Findings may represent multifocal pneumonia or asymmetric edema. Electronically Signed   By: Davina Poke D.O.   On: 01/08/2022 18:27     EKG: Independently reviewed, with result as described above. ***   Assessment/Plan    Principal Problem:    Rhabdomyolysis  ***      ***          ***           ***          ***          ***          ***          ***          ***     ***  DVT prophylaxis: SCD's ***  Code Status: Full code*** Family Communication: none*** Disposition Plan: Per Rounding Team Consults called: none***;  Admission status: ***   PLEASE NOTE THAT DRAGON DICTATION SOFTWARE WAS USED IN THE CONSTRUCTION OF THIS NOTE.   [place] DO [facility] [place]   01/08/2022, 8:41 PM   ***

## 2022-01-08 NOTE — Sepsis Progress Note (Signed)
eLink is following this Code Sepsis. °

## 2022-01-08 NOTE — Anesthesia Preprocedure Evaluation (Addendum)
Anesthesia Evaluation  Patient identified by MRN, date of birth, ID band Patient awake    Reviewed: Allergy & Precautions, NPO status , Patient's Chart, lab work & pertinent test resultsPreop documentation limited or incomplete due to emergent nature of procedure.  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   Endo/Other  negative endocrine ROS  Renal/GU Renal diseaseCr 1.83  negative genitourinary   Musculoskeletal negative musculoskeletal ROS (+)   Abdominal   Peds negative pediatric ROS (+)  Hematology Hb 19   Anesthesia Other Findings   Reproductive/Obstetrics negative OB ROS                           Anesthesia Physical Anesthesia Plan  ASA: 4 and emergent  Anesthesia Plan: MAC and Regional   Post-op Pain Management: Regional block* and Precedex   Induction:   PONV Risk Score and Plan: 2 and Propofol infusion and TIVA  Airway Management Planned: Natural Airway and Simple Face Mask  Additional Equipment: None  Intra-op Plan:   Post-operative Plan:   Informed Consent: I have reviewed the patients History and Physical, chart, labs and discussed the procedure including the risks, benefits and alternatives for the proposed anesthesia with the patient or authorized representative who has indicated his/her  understanding and acceptance.     Dental advisory given  Plan Discussed with: CRNA  Anesthesia Plan Comments: (Called by surgeon regarding emergency case after having already discussed with cardiology and ED about patient being very high risk for arrhythmias and other complications under anesthesia. Will attempt to sedate pt for supraclavicular nerve block to allow Dr. Tempie Donning to perform fasciotomies; ideally would like to avoid GA.  GA/RSI with rocuronium as backup plan  )       Anesthesia Quick Evaluation

## 2022-01-09 ENCOUNTER — Inpatient Hospital Stay (HOSPITAL_COMMUNITY): Payer: Medicaid Other

## 2022-01-09 ENCOUNTER — Inpatient Hospital Stay (HOSPITAL_COMMUNITY): Payer: Medicaid Other | Admitting: Certified Registered Nurse Anesthetist

## 2022-01-09 ENCOUNTER — Encounter (HOSPITAL_COMMUNITY): Payer: Self-pay | Admitting: Orthopedic Surgery

## 2022-01-09 ENCOUNTER — Encounter (HOSPITAL_COMMUNITY)
Admission: EM | Disposition: A | Discharge status: Home Health | Payer: Self-pay | Source: Home / Self Care | Attending: Internal Medicine

## 2022-01-09 DIAGNOSIS — T79A11A Traumatic compartment syndrome of right upper extremity, initial encounter: Secondary | ICD-10-CM

## 2022-01-09 DIAGNOSIS — I248 Other forms of acute ischemic heart disease: Secondary | ICD-10-CM | POA: Diagnosis not present

## 2022-01-09 DIAGNOSIS — R6510 Systemic inflammatory response syndrome (SIRS) of non-infectious origin without acute organ dysfunction: Secondary | ICD-10-CM | POA: Diagnosis present

## 2022-01-09 DIAGNOSIS — F17210 Nicotine dependence, cigarettes, uncomplicated: Secondary | ICD-10-CM

## 2022-01-09 DIAGNOSIS — R778 Other specified abnormalities of plasma proteins: Secondary | ICD-10-CM | POA: Diagnosis not present

## 2022-01-09 DIAGNOSIS — N179 Acute kidney failure, unspecified: Secondary | ICD-10-CM | POA: Diagnosis present

## 2022-01-09 DIAGNOSIS — N289 Disorder of kidney and ureter, unspecified: Secondary | ICD-10-CM

## 2022-01-09 DIAGNOSIS — E872 Acidosis, unspecified: Secondary | ICD-10-CM | POA: Diagnosis present

## 2022-01-09 DIAGNOSIS — F1910 Other psychoactive substance abuse, uncomplicated: Secondary | ICD-10-CM | POA: Diagnosis present

## 2022-01-09 DIAGNOSIS — M6282 Rhabdomyolysis: Secondary | ICD-10-CM | POA: Diagnosis not present

## 2022-01-09 DIAGNOSIS — R7401 Elevation of levels of liver transaminase levels: Secondary | ICD-10-CM | POA: Diagnosis present

## 2022-01-09 DIAGNOSIS — T79A0XA Compartment syndrome, unspecified, initial encounter: Secondary | ICD-10-CM | POA: Diagnosis present

## 2022-01-09 DIAGNOSIS — E875 Hyperkalemia: Secondary | ICD-10-CM | POA: Diagnosis present

## 2022-01-09 HISTORY — PX: FASCIOTOMY: SHX132

## 2022-01-09 LAB — COMPREHENSIVE METABOLIC PANEL
ALT: 293 U/L — ABNORMAL HIGH (ref 0–44)
AST: 1085 U/L — ABNORMAL HIGH (ref 15–41)
Albumin: 2.4 g/dL — ABNORMAL LOW (ref 3.5–5.0)
Alkaline Phosphatase: 97 U/L (ref 38–126)
Anion gap: 5 (ref 5–15)
BUN: 42 mg/dL — ABNORMAL HIGH (ref 6–20)
CO2: 18 mmol/L — ABNORMAL LOW (ref 22–32)
Calcium: 6.6 mg/dL — ABNORMAL LOW (ref 8.9–10.3)
Chloride: 109 mmol/L (ref 98–111)
Creatinine, Ser: 1.6 mg/dL — ABNORMAL HIGH (ref 0.61–1.24)
GFR, Estimated: 60 mL/min — ABNORMAL LOW (ref >60)
Glucose, Bld: 150 mg/dL — ABNORMAL HIGH (ref 70–99)
Potassium: 5.7 mmol/L — ABNORMAL HIGH (ref 3.5–5.1)
Sodium: 132 mmol/L — ABNORMAL LOW (ref 135–145)
Total Bilirubin: 0.7 mg/dL (ref 0.3–1.2)
Total Protein: 5.4 g/dL — ABNORMAL LOW (ref 6.5–8.1)

## 2022-01-09 LAB — CBC WITH DIFFERENTIAL/PLATELET
Abs Immature Granulocytes: 0.14 10*3/uL — ABNORMAL HIGH (ref 0.00–0.07)
Basophils Absolute: 0 10*3/uL (ref 0.0–0.1)
Basophils Relative: 0 %
Eosinophils Absolute: 0.1 10*3/uL (ref 0.0–0.5)
Eosinophils Relative: 0 %
HCT: 44.7 % (ref 39.0–52.0)
Hemoglobin: 15.1 g/dL (ref 13.0–17.0)
Immature Granulocytes: 1 %
Lymphocytes Relative: 5 %
Lymphs Abs: 0.9 10*3/uL (ref 0.7–4.0)
MCH: 30.5 pg (ref 26.0–34.0)
MCHC: 33.8 g/dL (ref 30.0–36.0)
MCV: 90.3 fL (ref 80.0–100.0)
Monocytes Absolute: 1.1 10*3/uL — ABNORMAL HIGH (ref 0.1–1.0)
Monocytes Relative: 5 %
Neutro Abs: 17.6 10*3/uL — ABNORMAL HIGH (ref 1.7–7.7)
Neutrophils Relative %: 89 %
Platelets: 204 10*3/uL (ref 150–400)
RBC: 4.95 MIL/uL (ref 4.22–5.81)
RDW: 13.2 % (ref 11.5–15.5)
WBC: 19.8 10*3/uL — ABNORMAL HIGH (ref 4.0–10.5)
nRBC: 0 % (ref 0.0–0.2)

## 2022-01-09 LAB — ECHOCARDIOGRAM COMPLETE
AR max vel: 2.74 cm2
AV Area VTI: 2.51 cm2
AV Area mean vel: 2.55 cm2
AV Mean grad: 5 mmHg
AV Peak grad: 8.9 mmHg
Ao pk vel: 1.49 m/s
Area-P 1/2: 4.21 cm2
Calc EF: 56.5 %
Height: 69 in
S' Lateral: 3.4 cm
Single Plane A2C EF: 57.2 %
Single Plane A4C EF: 55.8 %
Weight: 2768.98 oz

## 2022-01-09 LAB — LACTIC ACID, PLASMA: Lactic Acid, Venous: 1.3 mmol/L (ref 0.5–1.9)

## 2022-01-09 LAB — BASIC METABOLIC PANEL
Anion gap: 6 (ref 5–15)
Anion gap: 7 (ref 5–15)
BUN: 47 mg/dL — ABNORMAL HIGH (ref 6–20)
BUN: 44 mg/dL — ABNORMAL HIGH (ref 6–20)
CO2: 19 mmol/L — ABNORMAL LOW (ref 22–32)
CO2: 19 mmol/L — ABNORMAL LOW (ref 22–32)
Calcium: 7 mg/dL — ABNORMAL LOW (ref 8.9–10.3)
Calcium: 6.6 mg/dL — ABNORMAL LOW (ref 8.9–10.3)
Chloride: 106 mmol/L (ref 98–111)
Chloride: 104 mmol/L (ref 98–111)
Creatinine, Ser: 1.8 mg/dL — ABNORMAL HIGH (ref 0.61–1.24)
Creatinine, Ser: 1.88 mg/dL — ABNORMAL HIGH (ref 0.61–1.24)
GFR, Estimated: 52 mL/min — ABNORMAL LOW (ref >60)
GFR, Estimated: 49 mL/min — ABNORMAL LOW (ref >60)
Glucose, Bld: 132 mg/dL — ABNORMAL HIGH (ref 70–99)
Glucose, Bld: 199 mg/dL — ABNORMAL HIGH (ref 70–99)
Potassium: 4.9 mmol/L (ref 3.5–5.1)
Potassium: 4.6 mmol/L (ref 3.5–5.1)
Sodium: 131 mmol/L — ABNORMAL LOW (ref 135–145)
Sodium: 130 mmol/L — ABNORMAL LOW (ref 135–145)

## 2022-01-09 LAB — PROCALCITONIN: Procalcitonin: 49.43 ng/mL

## 2022-01-09 LAB — CK
Total CK: >50000 U/L — ABNORMAL HIGH (ref 49–397)
Total CK: 20938 U/L — ABNORMAL HIGH (ref 49–397)

## 2022-01-09 LAB — CREATININE, URINE, RANDOM: Creatinine, Urine: 64 mg/dL

## 2022-01-09 LAB — ACETAMINOPHEN LEVEL: Acetaminophen (Tylenol), Serum: <10 ug/mL — ABNORMAL LOW (ref 10–30)

## 2022-01-09 LAB — PROTIME-INR
INR: 1.5 — ABNORMAL HIGH (ref 0.8–1.2)
Prothrombin Time: 17.6 seconds — ABNORMAL HIGH (ref 11.4–15.2)

## 2022-01-09 LAB — HIV ANTIBODY (ROUTINE TESTING W REFLEX): HIV Screen 4th Generation wRfx: NONREACTIVE

## 2022-01-09 LAB — SALICYLATE LEVEL: Salicylate Lvl: <7 mg/dL — ABNORMAL LOW (ref 7.0–30.0)

## 2022-01-09 LAB — MRSA NEXT GEN BY PCR, NASAL: MRSA by PCR Next Gen: NOT DETECTED

## 2022-01-09 LAB — SEDIMENTATION RATE: Sed Rate: 6 mm/hr (ref 0–16)

## 2022-01-09 LAB — SODIUM, URINE, RANDOM: Sodium, Ur: 33 mmol/L

## 2022-01-09 LAB — PHOSPHORUS: Phosphorus: 2.7 mg/dL (ref 2.5–4.6)

## 2022-01-09 LAB — C-REACTIVE PROTEIN: CRP: 9.6 mg/dL — ABNORMAL HIGH (ref <1.0)

## 2022-01-09 LAB — BRAIN NATRIURETIC PEPTIDE: B Natriuretic Peptide: 168.2 pg/mL — ABNORMAL HIGH (ref 0.0–100.0)

## 2022-01-09 LAB — MAGNESIUM: Magnesium: 2.1 mg/dL (ref 1.7–2.4)

## 2022-01-09 LAB — TROPONIN I (HIGH SENSITIVITY): Troponin I (High Sensitivity): 5231 ng/L (ref <18)

## 2022-01-09 SURGERY — FASCIOTOMY, UPPER EXTREMITY
Anesthesia: General | Laterality: Right

## 2022-01-09 MED ORDER — PROMETHAZINE HCL 25 MG/ML IJ SOLN: 6.2500 mg | INTRAMUSCULAR | Status: DC | PRN

## 2022-01-09 MED ORDER — ONDANSETRON HCL 4 MG/2ML IJ SOLN
INTRAMUSCULAR | Status: DC | PRN
  Administered 2022-01-09: 4 mg via INTRAVENOUS

## 2022-01-09 MED ORDER — OXYCODONE HCL 5 MG PO TABS
5.0000 mg | ORAL_TABLET | Freq: Four times a day (QID) | ORAL | Status: DC | PRN
  Administered 2022-01-09 – 2022-01-16 (×16): 5 mg via ORAL
  Filled 2022-01-09 (×17): qty 1

## 2022-01-09 MED ORDER — VANCOMYCIN HCL 1500 MG/300ML IV SOLN
1500.0000 mg | INTRAVENOUS | Status: DC
  Administered 2022-01-09: 1500 mg via INTRAVENOUS
  Filled 2022-01-09 (×2): qty 300

## 2022-01-09 MED ORDER — SODIUM ZIRCONIUM CYCLOSILICATE 10 G PO PACK
10.0000 g | PACK | Freq: Two times a day (BID) | ORAL | Status: DC
  Filled 2022-01-09: qty 1

## 2022-01-09 MED ORDER — SODIUM CHLORIDE 0.9 % IV SOLN
2.0000 g | Freq: Three times a day (TID) | INTRAVENOUS | Status: DC
  Administered 2022-01-09 – 2022-01-11 (×8): 2 g via INTRAVENOUS
  Filled 2022-01-09 (×8): qty 12.5

## 2022-01-09 MED ORDER — LIDOCAINE HCL (PF) 1 % IJ SOLN
INTRAMUSCULAR | Status: AC
  Filled 2022-01-09: qty 30

## 2022-01-09 MED ORDER — ORAL CARE MOUTH RINSE: 15.0000 mL | OROMUCOSAL | Status: DC | PRN

## 2022-01-09 MED ORDER — PROPOFOL 10 MG/ML IV BOLUS
INTRAVENOUS | Status: AC
  Filled 2022-01-09: qty 20

## 2022-01-09 MED ORDER — LIDOCAINE 2% (20 MG/ML) 5 ML SYRINGE
INTRAMUSCULAR | Status: DC | PRN
  Administered 2022-01-09: 80 mg via INTRAVENOUS

## 2022-01-09 MED ORDER — OXYCODONE HCL 5 MG/5ML PO SOLN: 5.0000 mg | Freq: Once | ORAL | Status: DC | PRN

## 2022-01-09 MED ORDER — OXYCODONE HCL 5 MG PO TABS
5.0000 mg | ORAL_TABLET | Freq: Once | ORAL | Status: AC
  Administered 2022-01-09: 5 mg via ORAL
  Filled 2022-01-09: qty 1

## 2022-01-09 MED ORDER — HYDROMORPHONE HCL 1 MG/ML IJ SOLN: 0.5000 mg | Freq: Once | INTRAMUSCULAR | Status: DC

## 2022-01-09 MED ORDER — FUROSEMIDE 10 MG/ML IJ SOLN
80.0000 mg | Freq: Two times a day (BID) | INTRAMUSCULAR | Status: DC
  Administered 2022-01-09 (×2): 80 mg via INTRAVENOUS
  Filled 2022-01-09 (×3): qty 8

## 2022-01-09 MED ORDER — MIDAZOLAM HCL 2 MG/2ML IJ SOLN
INTRAMUSCULAR | Status: AC
  Filled 2022-01-09: qty 2

## 2022-01-09 MED ORDER — PROPOFOL 10 MG/ML IV BOLUS
INTRAVENOUS | Status: DC | PRN
  Administered 2022-01-09: 200 mg via INTRAVENOUS

## 2022-01-09 MED ORDER — HYDROMORPHONE HCL 1 MG/ML IJ SOLN
INTRAMUSCULAR | Status: DC | PRN
  Administered 2022-01-09 (×2): 1 mg via INTRAVENOUS

## 2022-01-09 MED ORDER — 0.9 % SODIUM CHLORIDE (POUR BTL) OPTIME
TOPICAL | Status: DC | PRN
  Administered 2022-01-09: 1000 mL

## 2022-01-09 MED ORDER — HYDROMORPHONE HCL 1 MG/ML IJ SOLN
1.0000 mg | INTRAMUSCULAR | Status: DC | PRN
  Administered 2022-01-09 – 2022-01-17 (×65): 1 mg via INTRAVENOUS
  Filled 2022-01-09 (×66): qty 1

## 2022-01-09 MED ORDER — HYDRALAZINE HCL 20 MG/ML IJ SOLN: 5.0000 mg | INTRAMUSCULAR | Status: DC | PRN

## 2022-01-09 MED ORDER — OXYCODONE HCL 5 MG PO TABS: 5.0000 mg | ORAL_TABLET | Freq: Once | ORAL | Status: DC | PRN

## 2022-01-09 MED ORDER — DEXAMETHASONE SODIUM PHOSPHATE 4 MG/ML IJ SOLN
INTRAMUSCULAR | Status: DC | PRN
  Administered 2022-01-09: 10 mg via INTRAVENOUS

## 2022-01-09 MED ORDER — FENTANYL CITRATE PF 50 MCG/ML IJ SOSY
25.0000 ug | PREFILLED_SYRINGE | Freq: Once | INTRAMUSCULAR | Status: AC
  Administered 2022-01-09: 25 ug via INTRAVENOUS
  Filled 2022-01-09: qty 1

## 2022-01-09 MED ORDER — BUPIVACAINE HCL 0.25 % IJ SOLN
INTRAMUSCULAR | Status: AC
  Filled 2022-01-09: qty 1

## 2022-01-09 MED ORDER — CHLORHEXIDINE GLUCONATE CLOTH 2 % EX PADS
6.0000 | MEDICATED_PAD | Freq: Every day | CUTANEOUS | Status: DC
  Administered 2022-01-09 – 2022-01-16 (×6): 6 via TOPICAL

## 2022-01-09 MED ORDER — SODIUM CHLORIDE 0.9 % IV SOLN: INTRAVENOUS | Status: DC

## 2022-01-09 MED ORDER — HYDROMORPHONE HCL 1 MG/ML IJ SOLN
0.5000 mg | Freq: Once | INTRAMUSCULAR | Status: AC
  Administered 2022-01-09: 0.5 mg via INTRAVENOUS

## 2022-01-09 MED ORDER — FENTANYL CITRATE (PF) 100 MCG/2ML IJ SOLN
INTRAMUSCULAR | Status: DC | PRN
  Administered 2022-01-09 (×2): 50 ug via INTRAVENOUS

## 2022-01-09 MED ORDER — ONDANSETRON HCL 4 MG/2ML IJ SOLN
INTRAMUSCULAR | Status: AC
  Filled 2022-01-09: qty 2

## 2022-01-09 MED ORDER — HYDROMORPHONE HCL 1 MG/ML IJ SOLN
0.5000 mg | Freq: Once | INTRAMUSCULAR | Status: AC
  Administered 2022-01-09: 0.5 mg via INTRAVENOUS
  Filled 2022-01-09: qty 1

## 2022-01-09 MED ORDER — HYDROMORPHONE HCL 2 MG/ML IJ SOLN
INTRAMUSCULAR | Status: AC
  Filled 2022-01-09: qty 1

## 2022-01-09 MED ORDER — FENTANYL CITRATE (PF) 100 MCG/2ML IJ SOLN
INTRAMUSCULAR | Status: AC
  Filled 2022-01-09: qty 2

## 2022-01-09 MED ORDER — HYDROMORPHONE HCL 1 MG/ML IJ SOLN: 0.2500 mg | INTRAMUSCULAR | Status: DC | PRN

## 2022-01-09 MED ORDER — MIDAZOLAM HCL 2 MG/2ML IJ SOLN
INTRAMUSCULAR | Status: DC | PRN
  Administered 2022-01-09: 2 mg via INTRAVENOUS

## 2022-01-09 MED ORDER — DEXAMETHASONE SODIUM PHOSPHATE 10 MG/ML IJ SOLN
INTRAMUSCULAR | Status: AC
  Filled 2022-01-09: qty 1

## 2022-01-09 SURGICAL SUPPLY — 44 items
BLADE SURG 15 STRL LF DISP TIS (BLADE) ×1 IMPLANT
BLADE SURG 15 STRL SS (BLADE) ×1
BNDG ELASTIC 3X5.8 VLCR STR LF (GAUZE/BANDAGES/DRESSINGS) ×1 IMPLANT
BNDG ELASTIC 4X5.8 VLCR STR LF (GAUZE/BANDAGES/DRESSINGS) IMPLANT
BNDG ESMARK 4X9 LF (GAUZE/BANDAGES/DRESSINGS) ×1 IMPLANT
BNDG GAUZE DERMACEA FLUFF 4 (GAUZE/BANDAGES/DRESSINGS) ×1 IMPLANT
BNDG PLASTER X FAST 3X3 WHT LF (CAST SUPPLIES) IMPLANT
CANISTER WOUNDNEG PRESSURE 500 (CANNISTER) IMPLANT
CHLORAPREP W/TINT 26 (MISCELLANEOUS) ×1 IMPLANT
CORD BIPOLAR FORCEPS 12FT (ELECTRODE) ×1 IMPLANT
COVER BACK TABLE 60X90IN (DRAPES) ×1 IMPLANT
COVER MAYO STAND STRL (DRAPES) ×1 IMPLANT
CUFF TOURN SGL QUICK 18X4 (TOURNIQUET CUFF) IMPLANT
CUFF TOURN SGL QUICK 24 (TOURNIQUET CUFF)
CUFF TRNQT CYL 24X4X16.5-23 (TOURNIQUET CUFF) IMPLANT
DRAPE EXTREMITY T 121X128X90 (DISPOSABLE) ×1 IMPLANT
DRAPE SURG 17X23 STRL (DRAPES) ×1 IMPLANT
DRSG EMULSION OIL 3X16 NADH (GAUZE/BANDAGES/DRESSINGS) IMPLANT
DRSG VAC GRANUFOAM LG (GAUZE/BANDAGES/DRESSINGS) IMPLANT
GAUZE XEROFORM 1X8 LF (GAUZE/BANDAGES/DRESSINGS) ×1 IMPLANT
GLOVE BIO SURGEON STRL SZ7 (GLOVE) ×1 IMPLANT
GLOVE BIOGEL PI IND STRL 7.0 (GLOVE) ×1 IMPLANT
GOWN STRL REUS W/ TWL LRG LVL3 (GOWN DISPOSABLE) ×1 IMPLANT
GOWN STRL REUS W/ TWL XL LVL3 (GOWN DISPOSABLE) ×1 IMPLANT
GOWN STRL REUS W/TWL LRG LVL3 (GOWN DISPOSABLE) ×1
GOWN STRL REUS W/TWL XL LVL3 (GOWN DISPOSABLE) ×1
KIT BASIN OR (CUSTOM PROCEDURE TRAY) ×1 IMPLANT
NDL HYPO 25X1 1.5 SAFETY (NEEDLE) IMPLANT
NEEDLE HYPO 25X1 1.5 SAFETY (NEEDLE) IMPLANT
NS IRRIG 1000ML POUR BTL (IV SOLUTION) ×1 IMPLANT
PAD CAST 3X4 CTTN HI CHSV (CAST SUPPLIES) IMPLANT
PADDING CAST ABS COTTON 4X4 ST (CAST SUPPLIES) IMPLANT
PADDING CAST COTTON 3X4 STRL (CAST SUPPLIES)
SLEEVE SCD COMPRESS KNEE MED (STOCKING) IMPLANT
SPLINT FIBERGLASS 3X35 (CAST SUPPLIES) ×1 IMPLANT
STAPLER VISISTAT 35W (STAPLE) IMPLANT
SUT ETHILON 3 0 PS 1 (SUTURE) IMPLANT
SUT ETHILON 4 0 PS 2 18 (SUTURE) ×1 IMPLANT
SUT MNCRL AB 3-0 PS2 18 (SUTURE) IMPLANT
SUT VICRYL 4-0 PS2 18IN ABS (SUTURE) IMPLANT
SYR BULB EAR ULCER 3OZ GRN STR (SYRINGE) ×1 IMPLANT
SYR CONTROL 10ML LL (SYRINGE) IMPLANT
TOWEL GREEN STERILE FF (TOWEL DISPOSABLE) ×2 IMPLANT
UNDERPAD 30X36 HEAVY ABSORB (UNDERPADS AND DIAPERS) ×1 IMPLANT

## 2022-01-09 NOTE — Transfer of Care (Signed)
Immediate Anesthesia Transfer of Care Note  Patient: Chris Parker  Procedure(s) Performed: RIGHT FOREARM FASCIOTOMY (Right)  Patient Location: PACU  Anesthesia Type:General  Level of Consciousness: drowsy  Airway & Oxygen Therapy: Patient Spontanous Breathing and Patient connected to face mask  Post-op Assessment: Report given to RN and Post -op Vital signs reviewed and stable  Post vital signs: Reviewed and stable  Last Vitals:  Vitals Value Taken Time  BP 142/100 01/09/22 1502  Temp    Pulse 109 01/09/22 1504  Resp 12 01/09/22 1506  SpO2 100 % 01/09/22 1504  Vitals shown include unvalidated device data.  Last Pain:  Vitals:   01/09/22 1300  TempSrc:   PainSc: 10-Worst pain ever      Patients Stated Pain Goal: 3 (91/69/45 0388)  Complications: No notable events documented.

## 2022-01-09 NOTE — H&P (View-Only) (Signed)
   Subjective:  No acute events since surgery.  Resting in bed with arm elevated.  Pt much more alert and conversant.  Reports minimal sensation in RUE.   Objective:   VITALS:   Vitals:   01/09/22 0800 01/09/22 0900 01/09/22 1000 01/09/22 1100  BP: (!) 155/91 (!) 161/90 (!) 160/93 (!) 160/98  Pulse: 86 85 90 93  Resp: 18 15 18 15  Temp: 98.9 F (37.2 C)     TempSrc: Oral     SpO2: 96% 97% 96% 99%  Weight:      Height:        Gen: Resting in bed.  Pain in forearm moderately controlled.  Pulm: Normal WOB on RA CV: RUE warm and well perfused, normal rate RUE: Dressing clean and dry.  Dressing seems tight this morning after continued swelling in arm.  Ace wrap and cast padding removed and replaced with very loose ace wrap.  Much more swollen and tense dorsally than intraoperatively last night.  Minimal sensation in ulnar nerve distribution and no sensation in median distribution.  Finger in much more natural resting posture this morning versus preoperative claw posture.  No active motor function in hand or wrist.  Fingers warm and well perfused w/ BCR.     Lab Results  Component Value Date   WBC 19.8 (H) 01/09/2022   HGB 15.1 01/09/2022   HCT 44.7 01/09/2022   MCV 90.3 01/09/2022   PLT 204 01/09/2022     Assessment/Plan:  30 yo RHD M POD 1 s/p right forearm fasciotomy and hand fasciotomy for compartment syndrome after being found down for unknown period of time.  Family found him down on his right side.  Was previously found earlier in the day with a tourniquet and syringe.   - WVAC to right forearm as unable to close primarily due to significant volar compartment swelling. Intact w/ good seal.  - Dressings seemed tighter this morning with continued swelling of the arm.  Cast padding and ace wrap removed and replaced with very loose ace wrap. -- Had thorough discussion with patient and family regarding the diagnosis of compartment syndrome and ultimate prognosis for his hand  function. - Unfortunately, he was down for an unknown period of time so it is unclear how long the compartment syndrome persisted.  The volar forearm muscle all appeared beefy red and viable intraoperatively last night but there were some patchy areas of more pale appearing muscle particularly within the deep compartment. Intraoperatively, after complete release of the volar forearm, the dorsal compartment was very soft and compressible. The dorsal compartment seems much more tense and tight this morning than last night in the OR.  - Will take to OR asap for release of the dorsal compartment for impending or developing dorsal forearm compartment syndrome.   Chris Bowlby Quinley Nesler, MD 01/09/2022, 12:04 PM [phone]  

## 2022-01-09 NOTE — Anesthesia Postprocedure Evaluation (Signed)
Anesthesia Post Note  Patient: Chris Parker  Procedure(s) Performed: RIGHT FOREARM FASCIOTOMY; APPLICATION OF WOUND VAC (Right)     Patient location during evaluation: PACU Anesthesia Type: General Level of consciousness: awake and alert Pain management: pain level controlled Vital Signs Assessment: post-procedure vital signs reviewed and stable Respiratory status: spontaneous breathing, nonlabored ventilation, respiratory function stable and patient connected to nasal cannula oxygen Cardiovascular status: blood pressure returned to baseline and stable Postop Assessment: no apparent nausea or vomiting Anesthetic complications: no   No notable events documented.  Last Vitals:  Vitals:   01/09/22 1530 01/09/22 1600  BP: (!) 161/92 (!) 159/106  Pulse: 93 99  Resp: (!) 21 18  Temp:    SpO2: 94% 98%    Last Pain:  Vitals:   01/09/22 1528  TempSrc:   PainSc: 0-No pain                 Audry Pili

## 2022-01-09 NOTE — Consult Note (Signed)
Renal Service Consult Note [facility]  Keith Felten 01/09/2022 Maree Krabbe, MD Requesting Physician: Dr. Jonathon Bellows  Reason for Consult: Renal failure, acute rhabdomyolysis HPI: The patient is a 31 y.o. year-old w/ hx of IV drug abuse who was was brought to ED per EMS after family found pt acting erratic w/ swelling of the R hand and face. In ED pt couldn't move his R leg and R hand was swollen and painful. Pt had been last seen at 11pm the night before, then family found him lying on the floor at 2 pm yest afternoon, also a tourniquet and needle was in his arm.  Pt was seen by orthopedics who recommended emergent fasciotomies of the R forearm and hand for compartment syndrome. While in ED pt rec'd 2 amps D50, novolog 5u IV, lokelma 10 gm po, IV cefepime and vanc, IV flagyl. LR bolus 3.5L, NS x 1L f/b NS IVFs at 100 cc/hr. CPK was > 50,000.  K+ was 6.1, Bun 42, creat 1.83.  Troponin was 6900, cardiology said was more likely type II supply/demand and said hold off on IV heparin drip. Overnight UOP was 360 and today UOP 2075 so far. Creat 1.8 > 1.6 at MN last night and 1.8 this am. K down to 4.9. We are asked to see for renal failure.   Pt seen in room, family at bedside. No hx renal failure, no nsaid abuse. No sig hx beside above.   ROS - denies CP, no joint pain, no HA, no blurry vision, no rash, no diarrhea, no nausea/ vomiting   Past Medical History  Past Medical History:  Diagnosis Date   IV drug abuse [facility])    Past Surgical History  Past Surgical History:  Procedure Laterality Date   FASCIECTOMY Right 01/08/2022   Procedure: FASCIECTOMY OF FOREARM AND HAND;  Surgeon: Marlyne Beards, MD;  Location: [facility] ORS;  Service: Orthopedics;  Laterality: Right;  RIGHT HAND AND FOREARM   Family History History reviewed. No pertinent family history. Social History  reports that he has been smoking cigarettes. He does not have any smokeless tobacco history on file. He reports that he  does not currently use alcohol. He reports current drug use. Allergies No Known Allergies Home medications Prior to Admission medications   Not on File     Vitals:   01/09/22 1000 01/09/22 1100 01/09/22 1200 01/09/22 1300  BP: (!) 160/93 (!) 160/98 (!) 163/99 (!) 158/104  Pulse: 90 93 99 93  Resp: 18 15 20 12   Temp:   98.7 F (37.1 C)   TempSrc:   Oral   SpO2: 96% 99% 97% 99%  Weight:      Height:       Exam Gen alert, no distress, nasal O2 No rash, cyanosis or gangrene Sclera anicteric, throat clear  No jvd or bruits Chest clear bilat to bases, no rales/ wheezing RRR no MRG Abd soft ntnd no mass or ascites +bs GU normal male MS no joint effusions or deformity Ext R arm wrapped, bilat LE's w/o edema, no other edema Neuro is alert, Ox 3 , nf   Home meds include - none    UA 9/22 - negative   UNa 33,  UCr 64    +IV contrast 75 cc w/ CT scan 9 pm last night     BP 160/ 95,  HR 95-100, RR 12-18  , temp 101.9 max > 98.7 this am     2L [place] 97%  CXR 9/22 6pm - bilat R > L IS infiltrates, edema vs other     CT abd 9/22 - Urinary tract- kidneys appear normal, without renal calculi, focal lesion, or hydronephrosis. Bladder is unremarkable.  Assessment/ Plan: AKI - no old labs. Creat here 1.8 yest upon admission, down to 1.6 late last night, and back up to 1.8 this am, in setting of acute rhabdomyolysis related to drug OD and compression muscle injury. CT abd shows normal kidneys w/o obstruction. UA is normal, urine lytes c/w ATN. Pt was admitted and given 4.5 L bolus in ED + LR at 150 cc/hr overnight. He went to OR then for compartment syndrome. He is making urine in good amounts today, creat stable at 1.8. Will lower IVFs from 200 > 100 cc/hr for now while awaiting repeat CXR (see below). Get bid K+, rhabdomyolysis-related hyperkalemia can be difficult to control. Low threshold to placing foley. Will follow.   Acute rhabdomyolysis - due to drug OD and compression injury. CPK >  50K. Check daily. Usually will resolve in about 3-5 days.  Hyperkalemia - would cont lokelma to keep K+ under 5.2, have ordered bid 10 gm for now, hold if K < 4.8.  Check K bid for next 48 hrs. Use renal diet if/ when eating.  Abnormal CXR - 6 pm film from ED was at same time of largest bolus, CXR looks like edema but also could just be aspiration pneumonitis given his presentation. Will repeat CXR today. Lowered IVF's to 100 cc/hr for now. Gave lasix 80mg  x 1 IV.    Rob Lydell Moga  MD 01/09/2022, 2:32 PM Recent Labs  Lab 01/08/22 1739 01/08/22 2208 01/09/22 0038 01/09/22 0058 01/09/22 0654  HGB 19.0* 14.6 15.1  --   --   ALBUMIN 3.4*  --   --  2.4*  --   CALCIUM 7.9*  --   --  6.6* 7.0*  PHOS  --   --   --  2.7  --   CREATININE 1.83*  --   --  1.60* 1.80*  K 6.1* 5.1  --  5.7* 4.9   Inpatient medications:  [MAR Hold] Chlorhexidine Gluconate Cloth  6 each Topical Daily   [MAR Hold] furosemide  80 mg Intravenous BID   [MAR Hold] sodium zirconium cyclosilicate  10 g Oral BID    sodium chloride 100 mL/hr at 01/09/22 1334   [MAR Hold] ceFEPime (MAXIPIME) IV Stopped (01/09/22 0956)   [MAR Hold] dexmedetomidine (PRECEDEX) IV infusion Stopped (01/08/22 2322)   [MAR Hold] vancomycin     0.9 % irrigation (POUR BTL), [MAR Hold] acetaminophen **OR** [MAR Hold] acetaminophen, [MAR Hold] hydrALAZINE, [MAR Hold]  HYDROmorphone (DILAUDID) injection, [MAR Hold] naLOXone (NARCAN)  injection, [MAR Hold] ondansetron (ZOFRAN) IV, [MAR Hold] mouth rinse, [MAR Hold] oxyCODONE

## 2022-01-09 NOTE — Progress Notes (Signed)
  Echocardiogram 2D Echocardiogram has been performed.  [name] 01/09/2022, 12:44 PM

## 2022-01-09 NOTE — Op Note (Signed)
Date of Surgery: 01/09/2022  INDICATIONS: Patient is a 31 y.o.-year-old male with right forearm and hand compartment syndrome after being found down for unknown amoutn of time.  He underwent volar forearm and hand fasciotomy last night.   The dorsal compartment was very soft and easily compressible intraoperatively last night after volar compartment release. Over the course of the morning, he has developed worsening pain and tension in the dorsal forearm compartment concerning for impending dorsal forearm compartment syndrome.  He had an Ace wrap on his arm that was placed in the operating room last night.  Given the increased swelling of the forearm and hand overnight, the Ace wrap was somewhat tight.  This tight Ace wrap was removed and replaced with a very loose Ace wrap hoping that this would improve his symptoms.  They did not.  After discussion with the family, we have decided to proceed with dorsal compartment release and reapplication of wound vac to try to maximize functional recovery and prevent potentially reversible muscle damage.  Risks, benefits, and alternatives to surgery were again discussed with the patient in the preoperative area. The patient wishes to proceed with surgery.  Informed consent was signed after our discussion.   PREOPERATIVE DIAGNOSIS:  Right dorsal forearm compartment syndrome  POSTOPERATIVE DIAGNOSIS: Same.  PROCEDURE:  Right dorsal forearm compartment release   SURGEON: Audria Nine, M.D.  ASSIST:   ANESTHESIA:  general  IV FLUIDS AND URINE: See anesthesia.  ESTIMATED BLOOD LOSS: 10 mL.  IMPLANTS: * No implants in log *   DRAINS: [place] volar forearm  COMPLICATIONS: None  DESCRIPTION OF PROCEDURE: The patient was met in the preoperative holding area where the surgical site was marked and the consent form was verified.  The patient was then taken to the operating room and transferred to the operating table.  All bony prominences were well padded.   A tourniquet was applied to the right upper arm.  General endotracheal anesthesia was induced.  The operative extremity was prepped and draped in the usual and sterile fashion.  A formal time-out was performed to confirm that this was the correct patient, surgery, side, and site.   Following formal timeout, the volar forearm wound was reexplored.  2 of the proximal sutures were removed.  The muscles were quite swollen.  They were darker in color compared to intraoperatively last night.  There is minimal contraction with Bovie stimulation of the superficial or deep compartments.  There was no evidence of unreleased fascia.  I then turned my attention to the dorsal aspect of the forearm.  The dorsal forearm was somewhat softer than it felt in the patient's room.  A longitudinal incision was made at the dorsal aspect of the midforearm.  The skin was incised.  The fascia was identified.  A fascial release was performed using a curved Metzenbaum scissors from the proximal forearm to proximal to the extensor retinaculum.  There was mildly bulging muscle from the dorsal compartment.  The muscle was beefy red and viable appearing.  The dorsal wound was then thoroughly irrigated with copious sterile saline.  It was easily reapproximated with several sutures with no tension on the wound.  The left forearm was then thoroughly irrigated.  The wound VAC was reapplied.  The VAC sponge was connected to the vacuum unit and a good seal was obtained.  Soft dressings were applied over the carpal tunnel, thenar, hyperthenar, and dorsal interossei incisions.  The fingers remained warm, pink, and well-perfused at the end of  the procedure.  The arm was dressed with a very loose Ace wrap.  Patient was reversed of anesthesia and extubated uneventfully.  He was transferred to the postoperative bed.  All counts were correct at the end the procedure.  The patient was into the PACU in stable condition.  POSTOPERATIVE PLAN: He will be  returned to his room on the floor.  He is still undergoing resuscitation and further management of his rhabdomyolysis with concomitant electrolyte derangement.  We will plan for repeat I&D on Monday with likely application of the wound VAC.  Audria Nine, MD 2:55 PM

## 2022-01-09 NOTE — Progress Notes (Signed)
Patient back from surgery and urinating small amounts. Post void residual showed 600 cc. Foley catheter placed per Dr. Sue Lush recommendations to place foley if the patient has any more issues with retention. Foley placed with close to 1000 cc's immediately returned.

## 2022-01-09 NOTE — Anesthesia Preprocedure Evaluation (Addendum)
Anesthesia Evaluation  Patient identified by MRN, date of birth, ID band Patient awake    Reviewed: Allergy & Precautions, NPO status , Patient's Chart, lab work & pertinent test results  Airway Mallampati: III  TM Distance: >3 FB Neck ROM: Full    Dental  (+) Dental Advisory Given, Poor Dentition   Pulmonary Current Smoker and Patient abstained from smoking.,    Pulmonary exam normal        Cardiovascular negative cardio ROS Normal cardiovascular exam     Neuro/Psych negative neurological ROS  negative psych ROS   GI/Hepatic negative GI ROS, (+)     substance abuse  alcohol use and IV drug use,  AST 1085 ALT 293    Endo/Other   Na 131 K 4.9 Ca 7   Renal/GU Renal InsufficiencyRenal disease     Musculoskeletal negative musculoskeletal ROS (+) narcotic dependent  Abdominal   Peds  Hematology Hb 19   Anesthesia Other Findings   Reproductive/Obstetrics                            Anesthesia Physical  Anesthesia Plan  ASA: 3  Anesthesia Plan: General   Post-op Pain Management: Minimal or no pain anticipated   Induction: Intravenous  PONV Risk Score and Plan: 1 and Ondansetron, Dexamethasone, Midazolam and Treatment may vary due to age or medical condition  Airway Management Planned: LMA  Additional Equipment: None  Intra-op Plan:   Post-operative Plan: Extubation in OR  Informed Consent: I have reviewed the patients History and Physical, chart, labs and discussed the procedure including the risks, benefits and alternatives for the proposed anesthesia with the patient or authorized representative who has indicated his/her understanding and acceptance.     Dental advisory given  Plan Discussed with: CRNA, Anesthesiologist and Surgeon  Anesthesia Plan Comments: (Patient still remains with reduced sensation in right arm, will not re-block at this time)        Anesthesia Quick Evaluation

## 2022-01-09 NOTE — Progress Notes (Signed)
Lab called critical troponin value. Finding is consistent and lower than previous readings.

## 2022-01-09 NOTE — Progress Notes (Addendum)
   Subjective:  No acute events since surgery.  Resting in bed with arm elevated.  Pt much more alert and conversant.  Reports minimal sensation in RUE.   Objective:   VITALS:   Vitals:   01/09/22 0800 01/09/22 0900 01/09/22 1000 01/09/22 1100  BP: (!) 155/91 (!) 161/90 (!) 160/93 (!) 160/98  Pulse: 86 85 90 93  Resp: 18 15 18 15   Temp: 98.9 F (37.2 C)     TempSrc: Oral     SpO2: 96% 97% 96% 99%  Weight:      Height:        Gen: Resting in bed.  Pain in forearm moderately controlled.  Pulm: Normal WOB on RA CV: RUE warm and well perfused, normal rate RUE: Dressing clean and dry.  Dressing seems tight this morning after continued swelling in arm.  Ace wrap and cast padding removed and replaced with very loose ace wrap.  Much more swollen and tense dorsally than intraoperatively last night.  Minimal sensation in ulnar nerve distribution and no sensation in median distribution.  Finger in much more natural resting posture this morning versus preoperative claw posture.  No active motor function in hand or wrist.  Fingers warm and well perfused w/ BCR.     Lab Results  Component Value Date   WBC 19.8 (H) 01/09/2022   HGB 15.1 01/09/2022   HCT 44.7 01/09/2022   MCV 90.3 01/09/2022   PLT 204 01/09/2022     Assessment/Plan:  31 yo RHD M POD 1 s/p right forearm fasciotomy and hand fasciotomy for compartment syndrome after being found down for unknown period of time.  Family found him down on his right side.  Was previously found earlier in the day with a tourniquet and syringe.   [facility] to right forearm as unable to close primarily due to significant volar compartment swelling. Intact w/ good seal.  - Dressings seemed tighter this morning with continued swelling of the arm.  Cast padding and ace wrap removed and replaced with very loose ace wrap. -- Had thorough discussion with patient and family regarding the diagnosis of compartment syndrome and ultimate prognosis for his hand  function. - Unfortunately, he was down for an unknown period of time so it is unclear how long the compartment syndrome persisted.  The volar forearm muscle all appeared beefy red and viable intraoperatively last night but there were some patchy areas of more pale appearing muscle particularly within the deep compartment. Intraoperatively, after complete release of the volar forearm, the dorsal compartment was very soft and compressible. The dorsal compartment seems much more tense and tight this morning than last night in the OR.  - Will take to OR asap for release of the dorsal compartment for impending or developing dorsal forearm compartment syndrome.   Sherilyn Cooter, MD 01/09/2022, 12:04 PM [phone]

## 2022-01-09 NOTE — Progress Notes (Signed)
PROGRESS NOTE Chris Parker  FHL:[id number] DOB: 1990/09/14 DOA: 01/08/2022 PCP: Patient, No Pcp Per   Brief Narrative/Hospital Course: 31-yom w/ history of IVDA, found down on the ground on his right side for unclear duration of time,reportedly with needle sticking out of right arm. In ED- in severe rhabdomyolysis w/ compartment syndrome  Rt UE  s/p OR by Dr. Frazier Butt- emergent fasciotomy RUE, placed on aggressive IVF,has SIRS criteria,but without overt underlying infectious source, cxr-right-sided airspace opacities ?asymmetric edema given that the patient was found on his right sidem procal and CTA ordere  placed onemperic broad-spectrum IV antibiotics with IV Vanco and cefepime. Also with hyperkalemia, s/p IV insulin and Lokelma, AKI creat 1.8, metabolci acidosis, transaminitis, w/ elevated troponin discussed w/ Dr. Rennis Golden, who felt that this elevation was less likely represent acute MI, but more likely on the basis of type II supply/demand mismatch- no heparin added but troponin trended and echocardiogram. ABG w/ 7.29/41/158/21. CT abd.pelvis, CT T L spine and Head done as below:1.Inflammatory fat stranding along the anterior and lateral aspect of the lower right chest wall, consistent with the patient's history of recent trauma. Mild patchy right middle lobe and anterior right lower lobe airspace disease, which may represent ill-defined areas of pulmonary contusion. An underlying infectious versus inflammatory process cannot be excluded. Patchy opacities on rt lung-consistent w/ pneumonia No osseus findings on L/T spine.  Subjective: Seen and examined. He is alert awake oriented x3, complains of pain on right arm, with decreased sensation, Unable to move right arm, states he has no sensation on right upper arm as well Reports he has sensation on right lower extremity but diminished strength/barely able to move Admitted doing fentanyl after a long time on Friday  9/23: Tmax overnight 101.9  Labs  k5.7, ast.alt high but trending down. Creat 1.6 wbc 26> 19   Assessment and Plan: Principal Problem:   Rhabdomyolysis Active Problems:   Compartment syndrome (HCC)   SIRS (systemic inflammatory response syndrome) (HCC)   Elevated troponin   Hyperkalemia   AKI (acute kidney injury) (HCC)   Lactic acidosis   Transaminitis   IV drug abuse (HCC)   Severe Rhabdomyolysis secondary to Prolonged laying on right side Compartment syndrome RUE 2/2 laying on rt side/drug use: Status post emergent fasciectomy of forearm and hand Dr. Frazier Butt overnight.  Continue dressing changes/pain control with p.o./IV medication. ortho following.  Continue aggressive IV hydration and trend CK, BMP, monitor urine output.  Rt upper arm and lower extremity weakness also decreased sensation in the right arm Prolonged laying on the right side: CT head no acute findings CT L and T-spine no acute finding.  Most likely from rhabdomyolysis from laying on the right side, if does not improve will need further work-up/neuro evaluation.  Right sided pneumonia likely aspiration pneumonia/Sepsis POA: Pro-Cal elevated 49, wbc I n16k- imaging w/ rt sided pneumonia, also with compartment syndrome right upper extremity. W/ IVDA Hx-blood culture sent- cont w/ current empiric antibiotics.Currently on room air, WBC downtrending, BP high. Recent Labs  Lab 01/08/22 1739 01/08/22 1909 01/09/22 0038 01/09/22 0058  WBC 26.2*  --  19.8*  --   LATICACIDVEN 4.1* 2.9*  --  1.3  PROCALCITON  --   --   --  49.43    Hyponatremia: Continue IV fluid hydration Metabolic acidosis w/ lactic acidosis: Lactic acid resolved.  Monitor bicarb.  AKI due to rhabdomyolysis: Creatinine downtrending, monitor urine output Hyperkalemia: Status post temporizing measures insulin overnight, recheck lab and address accordingly with q12bmp,ck.  We will consult nephrology Recent Labs  Lab 01/08/22 1739 01/08/22 2208 01/09/22 0058 01/09/22 0654  K 6.1*  5.1 5.7* 4.9  CALCIUM 7.9*  --  6.6* 7.0*  MG 2.0  --  2.1  --   PHOS  --   --  2.7  --    Transaminitis w/ very high AST/ALT/ALP, w/ normal TB: Suspect from rhabdomyolysis.  Check acute hepatitis panel/right upper quadrant ultrasound, trend LFTs as below  Recent Labs  Lab 01/08/22 1739 01/09/22 0058  AST 1,191* 1,085*  ALT 318* 293*  ALKPHOS 153* 97  BILITOT 0.7 0.7  PROT 7.8 5.4*  ALBUMIN 3.4* 2.4*  INR 1.2 1.5*   Elevated troponin due to demand ischemia from rhabdomyolysis/compartment syndrome, trops [id number]: Dr. Debara Pickett was discussed- echo pending,no chest pain  IVDA admits using fentanyl, urine drug screen negative  High blood pressure no prior history of hypertension likely elevated due to pain.  Continue to address pain, prn antihypertensives if needed  I discussed specifically not to leave [place], as it may lead to incomplete treatment causing significant disability and even death, patient verbalized, nursing at the bedside.  DVT prophylaxis: SCDs Start: 01/08/22 2020 Code Status:   Code Status: Full Code Family Communication: plan of care discussed with patient at bedside. Patient status is: Inpatient because of severe rhabdomyolysis, compartment syndrome Level of care: Stepdown   Dispo: The patient is from: home            Anticipated disposition: TBD  Mobility Assessment (last 72 hours)     Mobility Assessment   No documentation.            Objective: Vitals last 24 hrs: Vitals:   01/09/22 0554 01/09/22 0600 01/09/22 0700 01/09/22 0800  BP:  (!) 167/98 (!) 189/92 (!) 155/91  Pulse: (!) 105 92 (!) 105 86  Resp: 17 14 18 18   Temp:    98.9 F (37.2 C)  TempSrc:    Oral  SpO2: 98% 97% 99% 96%  Weight:      Height:       Weight change:   Physical Examination: General exam: alert awake oriented x3,older than stated age, weak appearing. HEENT:Oral mucosa moist, Ear/Nose WNL grossly, dentition normal. Respiratory system:  bilaterally clear BS, no use of accessory muscle Cardiovascular system: S1 & S2 +, No JVD. Gastrointestinal system: Abdomen soft,NT,ND, BS+ Nervous System:Alert, awake, unable to move right upper extremity, mild movement in right lower extremity, able to feel sensation right lower extremity, left upper extremity and lower extremity intact sensation and motor Extremities: LE edema NEG,distal peripheral pulses palpable.RUE splint and dressing in place   Skin: No rashes,no icterus. MSK: Normal muscle bulk,tone, power  Medications reviewed:  Scheduled Meds:  Chlorhexidine Gluconate Cloth  6 each Topical Daily   Continuous Infusions:  sodium chloride 200 mL/hr at 01/09/22 0800   ceFEPime (MAXIPIME) IV Stopped (01/09/22 [id number])   dexmedetomidine (PRECEDEX) IV infusion Stopped (01/08/22 2322)   vancomycin        Diet Order             Diet NPO time specified Except for: Sips with Meds  Diet effective now                  Intake/Output Summary (Last 24 hours) at 01/09/2022 1013 Last data filed at 01/09/2022 0800 Gross per 24 hour  Intake 2046.64 ml  Output 1110 ml  Net 936.64 ml   Net IO Since Admission: 936.64 mL [01/09/22  1013]  Wt Readings from Last 3 Encounters:  01/09/22 78.5 kg   Unresulted Labs (From admission, onward)     Start     Ordered   01/10/22 0500  Comprehensive metabolic panel  Daily at 5am,   R     Question:  Specimen collection method  Answer:  Unit=Unit collect   01/09/22 0808   01/10/22 0500  CBC  Daily at 5am,   R     Question:  Specimen collection method  Answer:  Unit=Unit collect   01/09/22 0808   01/10/22 0500  Phosphorus  Tomorrow morning,   R       Question:  Specimen collection method  Answer:  Unit=Unit collect   01/09/22 0808   01/10/22 0500  Magnesium  Tomorrow morning,   R       Question:  Specimen collection method  Answer:  Unit=Unit collect   01/09/22 0808   01/10/22 0500  CK  Daily at 5am,   R     Question:  Specimen collection method   Answer:  Unit=Unit collect   01/09/22 1011   01/09/22 1900  Basic metabolic panel  Once-Timed,   TIMED       Question:  Specimen collection method  Answer:  Unit=Unit collect   01/09/22 1011   01/09/22 1900  CK  Once-Timed,   TIMED       Question:  Specimen collection method  Answer:  Unit=Unit collect   01/09/22 1011   01/09/22 0808  Hepatitis panel, acute  Add-on,   AD       Question:  Specimen collection method  Answer:  Unit=Unit collect   01/09/22 0808   01/09/22 0807  Magnesium  Add-on,   AD       Question:  Specimen collection method  Answer:  Unit=Unit collect   01/09/22 0808   01/09/22 0807  Phosphorus  Add-on,   AD       Question:  Specimen collection method  Answer:  Unit=Unit collect   01/09/22 0808   01/09/22 0500  CBC with Differential/Platelet  Tomorrow morning,   R        01/08/22 2021   01/08/22 2021  HIV Antibody (routine testing w rflx)  (HIV Antibody (Routine testing w reflex) panel)  Add-on,   AD        01/08/22 2020   01/08/22 1709  Urine Culture  (Septic presentation on arrival (screening labs, nursing and treatment orders for obvious sepsis))  ONCE - URGENT,   URGENT       Question:  Indication  Answer:  Sepsis   01/08/22 1713          Data Reviewed: I have personally reviewed following labs and imaging studies CBC: Recent Labs  Lab 01/08/22 1739 01/08/22 2208 01/09/22 0038  WBC 26.2*  --  19.8*  NEUTROABS 23.6*  --  17.6*  HGB 19.0* 14.6 15.1  HCT 54.9* 43.0 44.7  MCV 89.4  --  90.3  PLT 290  --  204   Basic Metabolic Panel: Recent Labs  Lab 01/08/22 1739 01/08/22 2208 01/09/22 0058 01/09/22 0654  NA 134* 135 132* 131*  K 6.1* 5.1 5.7* 4.9  CL 106  --  109 106  CO2 16*  --  18* 19*  GLUCOSE 147*  --  150* 132*  BUN 42*  --  42* 47*  CREATININE 1.83*  --  1.60* 1.80*  CALCIUM 7.9*  --  6.6* 7.0*  MG 2.0  --  2.1  --   PHOS  --   --  2.7  --    GFR: Estimated Creatinine Clearance: 61.1 mL/min (A) (by C-G formula based on SCr of 1.8  mg/dL (H)). Liver Function Tests: Recent Labs  Lab 01/08/22 1739 01/09/22 0058  AST 1,191* 1,085*  ALT 318* 293*  ALKPHOS 153* 97  BILITOT 0.7 0.7  PROT 7.8 5.4*  ALBUMIN 3.4* 2.4*   Recent Labs  Lab 01/08/22 1739  LIPASE 35   No results for input(s): "AMMONIA" in the last 168 hours. Coagulation Profile: Recent Labs  Lab 01/08/22 1739 01/09/22 0058  INR 1.2 1.5*   BNP (last 3 results) No results for input(s): "PROBNP" in the last 8760 hours. HbA1C: No results for input(s): "HGBA1C" in the last 72 hours. CBG: Recent Labs  Lab 01/08/22 1959 01/08/22 2347  GLUCAP 119* 144*   Lipid Profile: No results for input(s): "CHOL", "HDL", "LDLCALC", "TRIG", "CHOLHDL", "LDLDIRECT" in the last 72 hours. Thyroid Function Tests: No results for input(s): "TSH", "T4TOTAL", "FREET4", "T3FREE", "THYROIDAB" in the last 72 hours. Sepsis Labs: Recent Labs  Lab 01/08/22 1739 01/08/22 1909 01/09/22 0058  PROCALCITON  --   --  49.43  LATICACIDVEN 4.1* 2.9* 1.3    Recent Results (from the past 240 hour(s))  Blood Culture (routine x 2)     Status: None (Preliminary result)   Collection Time: 01/08/22  5:39 PM   Specimen: BLOOD  Result Value Ref Range Status   Specimen Description   Final    BLOOD RIGHT ANTECUBITAL Performed at [facility], [place] [address], [place], [place] [address]    Special Requests   Final    BOTTLES DRAWN AEROBIC AND ANAEROBIC Blood Culture adequate volume Performed at [facility] [address] [place], [place] [address]    Culture   Final    NO GROWTH < 12 HOURS Performed at [facility] [address] [place], [place] [address]    Report Status PENDING  Incomplete  Resp Panel by RT-PCR (Flu A&B, Covid) Anterior Nasal Swab     Status: None   Collection Time: 01/08/22  5:42 PM   Specimen: Anterior Nasal Swab  Result Value Ref Range Status   SARS Coronavirus 2 by RT PCR NEGATIVE NEGATIVE Final     Comment: (NOTE) SARS-CoV-2 target nucleic acids are NOT DETECTED.  The SARS-CoV-2 RNA is generally detectable in upper respiratory specimens during the acute phase of infection. The lowest concentration of SARS-CoV-2 viral copies this assay can detect is 138 copies/mL. A negative result does not preclude SARS-Cov-2 infection and should not be used as the sole basis for treatment or other patient management decisions. A negative result may occur with  improper specimen collection/handling, submission of specimen other than nasopharyngeal swab, presence of viral mutation(s) within the areas targeted by this assay, and inadequate number of viral copies(<138 copies/mL). A negative result must be combined with clinical observations, patient history, and epidemiological information. The expected result is Negative.  Fact Sheet for Patients:  EntrepreneurPulse.com.au  Fact Sheet for Healthcare Providers:  IncredibleEmployment.be  This test is no t yet approved or cleared by the [place] FDA and  has been authorized for detection and/or diagnosis of SARS-CoV-2 by FDA under an Emergency Use Authorization (EUA). This EUA will remain  in effect (meaning this test can be used) for the duration of the COVID-19 declaration under Section 564(b)(1) of the Act, 21 U.S.C.section 360bbb-3(b)(1), unless the authorization is terminated  or revoked sooner.       Influenza A by PCR NEGATIVE NEGATIVE Final   Influenza B by PCR NEGATIVE NEGATIVE Final    Comment: (NOTE) The Xpert Xpress SARS-CoV-2/FLU/RSV plus assay is intended as an aid in the diagnosis of influenza from Nasopharyngeal swab specimens and should not be used as a sole basis for treatment. Nasal washings and aspirates are unacceptable for Xpert Xpress SARS-CoV-2/FLU/RSV testing.  Fact Sheet for Patients: EntrepreneurPulse.com.au  Fact Sheet for Healthcare  Providers: IncredibleEmployment.be  This test is not yet approved or cleared by the [place] FDA and has been authorized for detection and/or diagnosis of SARS-CoV-2 by FDA under an Emergency Use Authorization (EUA). This EUA will remain in effect (meaning this test can be used) for the duration of the COVID-19 declaration under Section 564(b)(1) of the Act, 21 U.S.C. section 360bbb-3(b)(1), unless the authorization is terminated or revoked.  Performed at [facility], [place] [address], [place], [place] [address]   Blood Culture (routine x 2)     Status: None (Preliminary result)   Collection Time: 01/08/22  6:00 PM   Specimen: BLOOD  Result Value Ref Range Status   Specimen Description   Final    BLOOD LEFT ANTECUBITAL Performed at [facility] [address], [place], [place] [address]    Special Requests   Final    BOTTLES DRAWN AEROBIC AND ANAEROBIC Blood Culture adequate volume Performed at [facility] [address] [place], [place] [address]    Culture   Final    NO GROWTH < 12 HOURS Performed at [facility] [address] [place], [place] [address]    Report Status PENDING  Incomplete  MRSA Next Gen by PCR, Nasal     Status: None   Collection Time: 01/09/22  2:53 AM   Specimen: Nasal Mucosa; Nasal Swab  Result Value Ref Range Status   MRSA by PCR Next Gen NOT DETECTED NOT DETECTED Final    Comment: (NOTE) The GeneXpert MRSA Assay (FDA approved for NASAL specimens only), is one component of a comprehensive MRSA colonization surveillance program. It is not intended to diagnose MRSA infection nor to guide or monitor treatment for MRSA infections. Test performance is not FDA approved in patients less than 95 years old. Performed at [facility], [place] [address], [place], [place] [address]     Antimicrobials: Anti-infectives (From admission, onward)    Start      Dose/Rate Route Frequency Ordered Stop   01/09/22 1600  vancomycin (VANCOREADY) IVPB 1500 mg/300 mL        1,500 mg 150 mL/hr over 120 Minutes Intravenous Every 24 hours 01/09/22 0043     01/09/22 0200  ceFEPIme (MAXIPIME) 2 g in sodium chloride 0.9 % 100 mL IVPB        2 g 200 mL/hr over 30 Minutes Intravenous Every 8 hours 01/09/22 0043     01/08/22 1715  ceFEPIme (MAXIPIME) 2 g in sodium chloride 0.9 % 100 mL IVPB        2 g 200 mL/hr over 30 Minutes Intravenous  Once 01/08/22 1713 01/08/22 1819   01/08/22 1715  metroNIDAZOLE (FLAGYL) IVPB 500 mg        500 mg 100 mL/hr over 60 Minutes Intravenous  Once 01/08/22 1713 01/08/22 1833   01/08/22 1715  vancomycin (VANCOCIN) IVPB 1000 mg/200 mL premix        1,000 mg 200 mL/hr over 60 Minutes  Intravenous  Once 01/08/22 1713 01/08/22 2019      Culture/Microbiology    Component Value Date/Time   SDES  01/08/2022 1800    BLOOD LEFT ANTECUBITAL Performed at [facility], [place] [address], [place], [place] [address]    SPECREQUEST  01/08/2022 1800    BOTTLES DRAWN AEROBIC AND ANAEROBIC Blood Culture adequate volume Performed at [facility] [address] [place], [place] [address]    CULT  01/08/2022 1800    NO GROWTH < 12 HOURS Performed at [facility] [address], [place], [place] [address]    REPTSTATUS PENDING 01/08/2022 1800    Other culture-see note  Radiology Studies: CT L-SPINE NO CHARGE  Result Date: 01/08/2022 CLINICAL DATA:  Altered mental status, trauma EXAM: CT LUMBAR SPINE WITHOUT CONTRAST TECHNIQUE: Multidetector CT imaging of the lumbar spine was performed without intravenous contrast administration. Multiplanar CT image reconstructions were also generated. RADIATION DOSE REDUCTION: This exam was performed according to the departmental dose-optimization program which includes automated exposure control, adjustment of the mA and/or kV according to patient size and/or  use of iterative reconstruction technique. COMPARISON:  None Available. FINDINGS: Segmentation: 5 lumbar type vertebrae. Alignment: Straightening of the lumbar spine. Vertebrae: No acute fracture or focal pathologic process. Small Schmorl node in the superior endplate of L2 vertebral body. Paraspinal and other soft tissues: Negative. Disc levels: Disc heights are maintained. Significant disc bulge, spinal canal or neural foraminal stenosis. Mild multilevel facet joint arthropathy. IMPRESSION: 1.  No acute fracture or traumatic subluxation. 2.  Mild multiple facet joint arthropathy Electronically Signed   By: Keane Police D.O.   On: 01/08/2022 22:07   CT T-SPINE NO CHARGE  Result Date: 01/08/2022 CLINICAL DATA:  Initial evaluation for you attic behavior, fentanyl use, swelling to right hand and face. EXAM: CT THORACIC SPINE WITHOUT CONTRAST TECHNIQUE: Multidetector CT images of the thoracic were obtained using the standard protocol without intravenous contrast. RADIATION DOSE REDUCTION: This exam was performed according to the departmental dose-optimization program which includes automated exposure control, adjustment of the mA and/or kV according to patient size and/or use of iterative reconstruction technique. COMPARISON:  None Available. FINDINGS: Alignment: Physiologic with preservation of the normal thoracic kyphosis. No listhesis. Vertebrae: Vertebral body height maintained without acute or chronic fracture. Visualized ribs intact. No worrisome osseous lesions. No evidence for osteomyelitis discitis or septic arthritis by CT. Paraspinal and other soft tissues: Paraspinous soft tissues within normal limits. Patchy opacities within the visualized right lung, consistent with pneumonia. Disc levels: Unremarkable. IMPRESSION: 1. No acute osseous abnormality within the thoracic spine. 2. Patchy opacities within the visualized right lung, consistent with pneumonia. Electronically Signed   By: Jeannine Boga  M.D.   On: 01/08/2022 22:03   CT ABDOMEN PELVIS W CONTRAST  Result Date: 01/08/2022 CLINICAL DATA:  Status post trauma. EXAM: CT ABDOMEN AND PELVIS WITH CONTRAST TECHNIQUE: Multidetector CT imaging of the abdomen and pelvis was performed using the standard protocol following bolus administration of intravenous contrast. RADIATION DOSE REDUCTION: This exam was performed according to the departmental dose-optimization program which includes automated exposure control, adjustment of the mA and/or kV according to patient size and/or use of iterative reconstruction technique. CONTRAST:  30mL OMNIPAQUE IOHEXOL 350 MG/ML SOLN COMPARISON:  None Available. FINDINGS: Lower chest: Mild patchy right middle lobe and anterior right lower lobe airspace disease is noted. Hepatobiliary: No focal liver abnormality is seen. No gallstones, gallbladder wall thickening, or biliary dilatation. Pancreas: Unremarkable. No  pancreatic ductal dilatation or surrounding inflammatory changes. Spleen: Normal in size without focal abnormality. Adrenals/Urinary Tract: Adrenal glands are unremarkable. Kidneys are normal, without renal calculi, focal lesion, or hydronephrosis. Bladder is unremarkable. Stomach/Bowel: Stomach is within normal limits. Appendix appears normal. No evidence of bowel wall thickening, distention, or inflammatory changes. Vascular/Lymphatic: No significant vascular findings are present. No enlarged abdominal or pelvic lymph nodes. Reproductive: Prostate is unremarkable. Other: No abdominal wall hernia.  No abdominopelvic ascites. Musculoskeletal: Mild to moderate severity subcutaneous and para muscular inflammatory fat stranding is seen along the anterior and lateral aspect of the lower right chest wall. No acute osseous abnormalities are identified. IMPRESSION: 1. Inflammatory fat stranding along the anterior and lateral aspect of the lower right chest wall, consistent with the patient's history of recent trauma. 2. Mild  patchy right middle lobe and anterior right lower lobe airspace disease, which may represent ill-defined areas of pulmonary contusion. An underlying infectious versus inflammatory process cannot be excluded. Electronically Signed   By: Virgina Norfolk M.D.   On: 01/08/2022 21:46   CT Angio Chest PE W and/or Wo Contrast  Result Date: 01/08/2022 CLINICAL DATA:  Sepsis. IV drug use. Right upper extremity and facial swelling. Altered mental status. High probability for pulmonary embolism. EXAM: CT ANGIOGRAPHY CHEST WITH CONTRAST TECHNIQUE: Multidetector CT imaging of the chest was performed using the standard protocol during bolus administration of intravenous contrast. Multiplanar CT image reconstructions and MIPs were obtained to evaluate the vascular anatomy. RADIATION DOSE REDUCTION: This exam was performed according to the departmental dose-optimization program which includes automated exposure control, adjustment of the mA and/or kV according to patient size and/or use of iterative reconstruction technique. CONTRAST:  40mL OMNIPAQUE IOHEXOL 350 MG/ML SOLN COMPARISON:  None Available. FINDINGS: Cardiovascular: Satisfactory opacification of pulmonary arteries noted, and no pulmonary emboli identified. No evidence of thoracic aortic dissection or aneurysm. Mediastinum/Nodes: No masses or pathologically enlarged lymph nodes identified. Lungs/Pleura: Right lung airspace opacity is seen, greatest in the right upper lobe, consistent with pneumonia. No evidence of mass or pleural effusion. Upper abdomen: No acute findings. Musculoskeletal: No suspicious bone lesions identified. Swelling and edema is seen involving the right lateral chest wall muscles and subcutaneous tissues. Review of the MIP images confirms the above findings. IMPRESSION: No evidence of pulmonary embolism. Right lung airspace opacity, greatest in right upper lobe, consistent with pneumonia. Swelling and edema involving right lateral chest wall  muscles and subcutaneous tissues. Electronically Signed   By: Marlaine Hind M.D.   On: 01/08/2022 21:41   CT Cervical Spine Wo Contrast  Result Date: 01/08/2022 CLINICAL DATA:  Patient acting erratic with swollen right hand and right face EXAM: CT HEAD WITHOUT CONTRAST CT CERVICAL SPINE WITHOUT CONTRAST TECHNIQUE: Multidetector CT imaging of the head and cervical spine was performed following the standard protocol without intravenous contrast. Multiplanar CT image reconstructions of the cervical spine were also generated. RADIATION DOSE REDUCTION: This exam was performed according to the departmental dose-optimization program which includes automated exposure control, adjustment of the mA and/or kV according to patient size and/or use of iterative reconstruction technique. COMPARISON:  None Available. FINDINGS: CT HEAD FINDINGS Exam is mildly compromised by motion artifact. Brain: No intracranial hemorrhage, mass effect, or evidence of acute infarct. No hydrocephalus. No extra-axial fluid collection. Vascular: No hyperdense vessel or unexpected calcification. Skull: No fracture or focal lesion. Sinuses/Orbits: No acute finding. Paranasal sinuses and mastoid air cells are well aerated. Other: None. CT CERVICAL SPINE FINDINGS Exam is mildly compromised by  motion artifact. Alignment: Normal. Skull base and vertebrae: No definite acute fracture. Mild anterior wedging of C6 is presumed chronic though technically age indeterminate. Irregularity through the C4 vertebral body corresponds to area of motion and is artifactual. No primary bone lesion or focal pathologic process. Soft tissues and spinal canal: No prevertebral fluid or swelling. No visible canal hematoma. Disc levels: No significant spondylosis or disc space height loss. No high-grade spinal canal or neural foraminal narrowing. Upper chest: Negative. Other: None. IMPRESSION: 1. No acute intracranial abnormality or calvarial fracture. 2. No definite acute  fracture in the cervical spine. Age indeterminate wedging of C6 is presumed chronic. Electronically Signed   By: Placido Sou M.D.   On: 01/08/2022 21:39   CT HEAD WO CONTRAST (5MM)  Result Date: 01/08/2022 CLINICAL DATA:  Patient acting erratic with swollen right hand and right face EXAM: CT HEAD WITHOUT CONTRAST CT CERVICAL SPINE WITHOUT CONTRAST TECHNIQUE: Multidetector CT imaging of the head and cervical spine was performed following the standard protocol without intravenous contrast. Multiplanar CT image reconstructions of the cervical spine were also generated. RADIATION DOSE REDUCTION: This exam was performed according to the departmental dose-optimization program which includes automated exposure control, adjustment of the mA and/or kV according to patient size and/or use of iterative reconstruction technique. COMPARISON:  None Available. FINDINGS: CT HEAD FINDINGS Exam is mildly compromised by motion artifact. Brain: No intracranial hemorrhage, mass effect, or evidence of acute infarct. No hydrocephalus. No extra-axial fluid collection. Vascular: No hyperdense vessel or unexpected calcification. Skull: No fracture or focal lesion. Sinuses/Orbits: No acute finding. Paranasal sinuses and mastoid air cells are well aerated. Other: None. CT CERVICAL SPINE FINDINGS Exam is mildly compromised by motion artifact. Alignment: Normal. Skull base and vertebrae: No definite acute fracture. Mild anterior wedging of C6 is presumed chronic though technically age indeterminate. Irregularity through the C4 vertebral body corresponds to area of motion and is artifactual. No primary bone lesion or focal pathologic process. Soft tissues and spinal canal: No prevertebral fluid or swelling. No visible canal hematoma. Disc levels: No significant spondylosis or disc space height loss. No high-grade spinal canal or neural foraminal narrowing. Upper chest: Negative. Other: None. IMPRESSION: 1. No acute intracranial  abnormality or calvarial fracture. 2. No definite acute fracture in the cervical spine. Age indeterminate wedging of C6 is presumed chronic. Electronically Signed   By: Placido Sou M.D.   On: 01/08/2022 21:39   DG Hand Complete Left  Result Date: 01/08/2022 CLINICAL DATA:  Hand swelling EXAM: LEFT HAND - COMPLETE 3+ VIEW COMPARISON:  None Available. FINDINGS: There is no evidence of fracture or dislocation. There is no evidence of arthropathy or other focal bone abnormality. Soft tissues are unremarkable. IMPRESSION: Negative. Electronically Signed   By: Davina Poke D.O.   On: 01/08/2022 18:28   DG Hand Complete Right  Result Date: 01/08/2022 CLINICAL DATA:  Right hand swelling with cutaneous blisters EXAM: RIGHT HAND - COMPLETE 3+ VIEW COMPARISON:  None Available. FINDINGS: There is no evidence of fracture or dislocation. No erosion or periosteal elevation. There is no evidence of arthropathy or other focal bone abnormality. Generalized soft tissue swelling. No soft tissue gas. IMPRESSION: 1. No acute osseous abnormality. 2. Generalized soft tissue swelling. Electronically Signed   By: Davina Poke D.O.   On: 01/08/2022 18:28   DG Chest Port 1 View  Result Date: 01/08/2022 CLINICAL DATA:  Possible sepsis.  Overdose EXAM: PORTABLE CHEST 1 VIEW COMPARISON:  None Available. FINDINGS: The heart size  and mediastinal contours are within normal limits. Diffuse interstitial and alveolar opacities throughout the right lung. Relatively mild interstitial prominence within the left lung. No pleural effusion or pneumothorax. The visualized skeletal structures are unremarkable. IMPRESSION: Diffuse airspace opacities throughout the right lung with mild interstitial prominence within the left lung. Findings may represent multifocal pneumonia or asymmetric edema. Electronically Signed   By: Davina Poke D.O.   On: 01/08/2022 18:27     LOS: 1 day   Antonieta Pert, MD [facility]  01/09/2022,  10:13 AM

## 2022-01-09 NOTE — Interval H&P Note (Signed)
History and Physical Interval Note:  01/09/2022 1:26 PM  Chris Parker  has presented today for surgery, with the diagnosis of COMPARTMENT SYNDROME.  The various methods of treatment have been discussed with the patient and family. After consideration of risks, benefits and other options for treatment, the patient has consented to  Procedure(s): RIGHT FOREARM FASCIOTOMY (Right) as a surgical intervention.  The patient's history has been reviewed, patient examined, no change in status, stable for surgery.  I have reviewed the patient's chart and labs.  Questions were answered to the patient's satisfaction.     [name] [name]

## 2022-01-09 NOTE — Anesthesia Procedure Notes (Signed)
Procedure Name: LMA Insertion Date/Time: 01/09/2022 1:40 PM  Performed by: Claudia Desanctis, CRNAPre-anesthesia Checklist: Emergency Drugs available, Patient identified, Suction available and Patient being monitored Patient Re-evaluated:Patient Re-evaluated prior to induction Oxygen Delivery Method: Circle system utilized Preoxygenation: Pre-oxygenation with 100% oxygen Induction Type: IV induction Ventilation: Mask ventilation without difficulty LMA: LMA inserted and LMA with gastric port inserted LMA Size: 4.0 Number of attempts: 1 Placement Confirmation: positive ETCO2 and breath sounds checked- equal and bilateral Tube secured with: Tape Dental Injury: Teeth and Oropharynx as per pre-operative assessment

## 2022-01-09 NOTE — Op Note (Addendum)
Date of Surgery: 01/08/22  INDICATIONS: Patient is a 31 y.o.-year-old male with right forearm and hand compartment syndrome after being found down for an unknown period of time yesterday.  His family saw him Thursday at 11 PM then found him the next day around 2 PM lying on the floor with a tourniquet and needle in his arm.  He was reportedly lying on his right side.  After correction of some of his electrolyte abnormalities, the patient was found to be alert, oriented, and consentable in the preoperative holding area.  We had a thorough discussion about the nature of compartment syndrome, the need for emergent hand and forearm fasciotomies, and the expected recovery.  We reviewed the risks of the surgery including bleeding, infection, damage to neurovascular structures, need for muscle debridement, need for additional surgery, possible loss of function secondary to muscle necrosis or injury. The patient wishes to proceed with surgery.  Informed consent was signed after our discussion.   PREOPERATIVE DIAGNOSIS:  Right forearm compartment syndrome Right hand compartment syndrome  POSTOPERATIVE DIAGNOSIS: Same.  PROCEDURE:  Right forearm fasciotomy (25956) Right hand fasciotomy (38756) Right carpal tunnel release (43329) Application of [place] to right forearm, 9 x 30 cm   SURGEON: Audria Nine, M.D.  ASSIST: None  ANESTHESIA:  Regional, MAC  IV FLUIDS AND URINE: See anesthesia.  ESTIMATED BLOOD LOSS: 10 mL.  IMPLANTS: * No implants in log *   DRAINS: [place] right forearm  COMPLICATIONS: None  DESCRIPTION OF PROCEDURE: The patient was met in the preoperative holding area where the surgical site was marked and the consent form was verified.  The patient was then taken to the operating room and transferred to the operating table.  All bony prominences were well padded.  A tourniquet was applied to the right upper arm.  Monitored sedation was induced.  The operative extremity was  prepped and draped in the usual and sterile fashion.  A formal time-out was performed to confirm that this was the correct patient, surgery, side, and site.   Following formal timeout, the limb was exsanguinated by gravity and the tourniquet inflated 250 mmHg.  I began by making a curvilinear incision from the [facility] fossa to the level of the wrist.  The incision curved radially to allow access to the mobile wad.  The skin and subcutaneous tissue was incised.  Small crossing vessels were coagulated.  The superficial volar forearm fascia was identified and released proximally and along the FCR tendon.  The radial artery was identified and protected.  A second fascial layer was found over the flexor digitorum superficialis muscle.  This was also sharply released.  The brachial radialis was identified after elevation of radial skin flaps.  The fascia overlying the mobile wad was also released completely.  I then retracted the superficial compartment ulnarly and identified the ulnar neurovascular bundle.  The deep volar forearm muscles including the flexor digitorum profundus and FPL muscle bellies were identified and the fascia was released.  The incision was carried out distally into the palm.  The incision curved at oblique angle crossing the wrist flexion crease.  The skin overlying the carpal tunnel was incised as was the subcutaneous tissue.  The longitudinal fibers of the superficial palmar fascia was identified and was incised.  A small amount of thenar musculature was bluntly swept off of the transverse carpal ligament.  The transverse carpal ligament was released completely both in the palm and crossing the wrist flexion crease as it became  confluent with the distal antebrachial forearm fascia.  The median nerve was completely decompressed.  I then made incisions over the thenar and hypothenar eminences.  Skin subcutaneous tissue was divided.  The fascia overlying the thenar and hypothenar muscles was released  with bulging of the underlying muscle tissue.  The hand was turned over a longitudinal incision was made over the index and ring finger metacarpals.  Through these incisions, all dorsal and volar interosseous muscle fascia was released.  I returned to the volar aspect of the wound.  All of the muscle appeared to be beefy red and healthy appearing, however, there was some areas of patchy pale discoloration particularly in the deep volar forearm compartment.  Following release of the volar forearm compartment, the dorsal compartment was completely soft without any tension or swelling.  I then thoroughly irrigated all wounds.  The proximal one third of the volar forearm incision was closed using nylon suture.  The remainder was unable to be closed secondary to swelling.  The carpal tunnel incision was closed.  The hand fasciotomy wounds were loosely reapproximated with nylon sutures.  A wound VAC was applied to the volar forearm with an incisional VAC component along the proximal aspect of the incision.  The wound VAC was connected and found to have adequate seal.  The arm was then dressed with cast padding and an Ace wrap.  Patient was then reversed from sedation and transferred to the postoperative bed.  All counts were correct x2 the end the procedure.  The patient was taken the PACU in stable condition.  POSTOPERATIVE PLAN: We will be admitted to the stepdown unit given his electrolyte derangements and significant rhabdomyolysis.  We will return to the operating room within 48 to 72 hours for repeat debridement and possible closure.  Audria Nine, MD 12:17 PM

## 2022-01-09 NOTE — Progress Notes (Signed)
Patient complaining of inability to urinate. Bladder scan revealed 393 cc's. In and out catheter emptied 800 cc's of tea colored urine, with a lot of dark sediment. Dr. Jonnie Finner at the bedside and recommended a foley catheter if the patient is unable to go again.

## 2022-01-09 NOTE — Progress Notes (Signed)
Pharmacy Antibiotic Note  Chris Parker is a 31 y.o. male admitted on 01/08/2022 with  who presents with pain, swelling, and erythema involving the right forearm and hand.  P was reportedly found agitated and altered by his family and brought to the [facility] ER by EMS.  By report, it sounds like he may have overdosed and was lying on the right side of his body for an unknown period of time. Pt underwent fasciectomy of forearm and hand.  Pharmacy has been consulted for vancomycin and cefepime dosing.  1st doses given in OR  Plan: Vancomycin 1500mg  IV q24h (AUC 488.9, Scr 1.83) Cefepime 2gm IV q8h Follow renal function, cultures and clinical course  Height: 5\' 9"  (175.3 cm) Weight: 78.5 kg (173 lb 1 oz) IBW/kg (Calculated) : 70.7  Temp (24hrs), Avg:98.8 F (37.1 C), Min:97.7 F (36.5 C), Max:100.1 F (37.8 C)  Recent Labs  Lab 01/08/22 1739 01/08/22 1909  WBC 26.2*  --   CREATININE 1.83*  --   LATICACIDVEN 4.1* 2.9*    Estimated Creatinine Clearance: 60.1 mL/min (A) (by C-G formula based on SCr of 1.83 mg/dL (H)).    No Known Allergies  Antimicrobials this admission: 9/22 vanc >> 9/22 cefepime >> 9/22 flagyl x 1  Dose adjustments this admission:   Microbiology results: 9/22 BCx:  9/22 UCx:    9/23 MRSA PCR:   Thank you for allowing pharmacy to be a part of this patient's care.  Dolly Rias RPh 01/09/2022, 12:41 AM

## 2022-01-09 NOTE — Hospital Course (Addendum)
30-yom w/ history of IVDA, found down on the ground on his right side for unclear duration of time,reportedly with needle sticking out of right arm. In ED- in severe rhabdomyolysis w/ compartment syndrome  Rt UE  s/p OR by Dr. Tempie Donning- emergent fasciotomy RUE, placed on aggressive IVF,has SIRS criteria,but without overt underlying infectious source, cxr-right-sided airspace opacities ?asymmetric edema given that the patient was found on his right sidem procal and CTA ordere  placed onemperic broad-spectrum IV antibiotics with IV Vanco and cefepime. Also with hyperkalemia, s/p IV insulin and Lokelma, AKI creat 1.8, metabolci acidosis, transaminitis, w/ elevated troponin discussed w/ Dr. Debara Pickett, who felt that this elevation was less likely represent acute MI, but more likely on the basis of type II supply/demand mismatch- no heparin added but troponin trended and echocardiogram. ABG w/ 7.29/41/158/21. CT abd.pelvis, CT T L spine and Head done as below:1.Inflammatory fat stranding along the anterior and lateral aspect of the lower right chest wall, consistent with the patient's history of recent trauma. Mild patchy right middle lobe and anterior right lower lobe airspace disease, which may represent ill-defined areas of pulmonary contusion. An underlying infectious versus inflammatory process cannot be excluded. Patchy opacities on rt lung-consistent w/ pneumonia No osseus findings on L/T spine.

## 2022-01-10 ENCOUNTER — Encounter (HOSPITAL_COMMUNITY): Payer: Self-pay | Admitting: Orthopedic Surgery

## 2022-01-10 ENCOUNTER — Inpatient Hospital Stay (HOSPITAL_COMMUNITY): Payer: Medicaid Other

## 2022-01-10 DIAGNOSIS — M6282 Rhabdomyolysis: Secondary | ICD-10-CM | POA: Diagnosis not present

## 2022-01-10 LAB — HEPATITIS PANEL, ACUTE
HCV Ab: REACTIVE — AB
Hep A IgM: NONREACTIVE
Hep B C IgM: NONREACTIVE
Hepatitis B Surface Ag: NONREACTIVE

## 2022-01-10 LAB — URINE CULTURE: Culture: NO GROWTH

## 2022-01-10 LAB — COMPREHENSIVE METABOLIC PANEL
ALT: 275 U/L — ABNORMAL HIGH (ref 0–44)
AST: 728 U/L — ABNORMAL HIGH (ref 15–41)
Albumin: 2.3 g/dL — ABNORMAL LOW (ref 3.5–5.0)
Alkaline Phosphatase: 82 U/L (ref 38–126)
Anion gap: 7 (ref 5–15)
BUN: 40 mg/dL — ABNORMAL HIGH (ref 6–20)
CO2: 20 mmol/L — ABNORMAL LOW (ref 22–32)
Calcium: 5.9 mg/dL — CL (ref 8.9–10.3)
Chloride: 107 mmol/L (ref 98–111)
Creatinine, Ser: 1.85 mg/dL — ABNORMAL HIGH (ref 0.61–1.24)
GFR, Estimated: 50 mL/min — ABNORMAL LOW (ref >60)
Glucose, Bld: 177 mg/dL — ABNORMAL HIGH (ref 70–99)
Potassium: 4.1 mmol/L (ref 3.5–5.1)
Sodium: 134 mmol/L — ABNORMAL LOW (ref 135–145)
Total Bilirubin: 0.7 mg/dL (ref 0.3–1.2)
Total Protein: 5.3 g/dL — ABNORMAL LOW (ref 6.5–8.1)

## 2022-01-10 LAB — CBC
HCT: 33 % — ABNORMAL LOW (ref 39.0–52.0)
Hemoglobin: 11.3 g/dL — ABNORMAL LOW (ref 13.0–17.0)
MCH: 31.2 pg (ref 26.0–34.0)
MCHC: 34.2 g/dL (ref 30.0–36.0)
MCV: 91.2 fL (ref 80.0–100.0)
Platelets: 166 10*3/uL (ref 150–400)
RBC: 3.62 MIL/uL — ABNORMAL LOW (ref 4.22–5.81)
RDW: 12.7 % (ref 11.5–15.5)
WBC: 16.5 10*3/uL — ABNORMAL HIGH (ref 4.0–10.5)
nRBC: 0 % (ref 0.0–0.2)

## 2022-01-10 LAB — POTASSIUM: Potassium: 3.6 mmol/L (ref 3.5–5.1)

## 2022-01-10 LAB — MAGNESIUM: Magnesium: 1.7 mg/dL (ref 1.7–2.4)

## 2022-01-10 LAB — PHOSPHORUS: Phosphorus: 3.5 mg/dL (ref 2.5–4.6)

## 2022-01-10 LAB — CK: Total CK: 1412 U/L — ABNORMAL HIGH (ref 49–397)

## 2022-01-10 LAB — TROPONIN I (HIGH SENSITIVITY): Troponin I (High Sensitivity): 1223 ng/L (ref <18)

## 2022-01-10 MED ORDER — LORAZEPAM 0.5 MG PO TABS
0.5000 mg | ORAL_TABLET | Freq: Once | ORAL | Status: AC | PRN
  Administered 2022-01-22: 0.5 mg via ORAL
  Filled 2022-01-10: qty 1

## 2022-01-10 MED ORDER — CALCIUM CARBONATE ANTACID 500 MG PO CHEW
1.0000 | CHEWABLE_TABLET | Freq: Three times a day (TID) | ORAL | Status: DC
  Administered 2022-01-10 – 2022-01-23 (×39): 200 mg via ORAL
  Filled 2022-01-10 (×41): qty 1

## 2022-01-10 MED ORDER — FUROSEMIDE 10 MG/ML IJ SOLN
60.0000 mg | Freq: Two times a day (BID) | INTRAMUSCULAR | Status: DC
  Administered 2022-01-10 – 2022-01-13 (×6): 60 mg via INTRAVENOUS
  Filled 2022-01-10 (×5): qty 6

## 2022-01-10 MED ORDER — FUROSEMIDE 10 MG/ML IJ SOLN: 60.0000 mg | Freq: Two times a day (BID) | INTRAMUSCULAR | Status: DC

## 2022-01-10 NOTE — Progress Notes (Addendum)
Patient went to MRI at approximately 1300. While lying on MRI table, patient began to get restless and stated that right leg was cramping and could not tolerate MRI. Patient asked to go back to patient room. Patient transported back to patient room. MD Lupita Leash and MD [place] notified. RN will continue to reassess patient's readiness to try MRI again.

## 2022-01-10 NOTE — Progress Notes (Signed)
   Subjective:  Pt resting comfortably.  Right arm and hand pain improved from yesterday.  Still with very limited sensation in right hand.    Objective:   VITALS:   Vitals:   01/10/22 0700 01/10/22 0800 01/10/22 0851 01/10/22 0900  BP: (!) 153/90 (!) 136/97  (!) 154/85  Pulse: 77 73 72 72  Resp: 15 15 (!) 22 17  Temp:   98.3 F (36.8 C)   TempSrc:   Oral   SpO2: 100% 97% 100% 97%  Weight:      Height:        Gen: NAD, resting comfortably Pulm: Normal WOB on RA CV: RUE warm and well perfused w/ BCR in all fingers RUE: Dressing clean and dry, WVAC to volar forearm intact w/ good seal, hand swelling seems improved from yesterday, able to weakly flex and extend elbow, still no active movement of fingers, sensation still significantly diminished throughout all exposed fingers, hand warm and well perfused w/ BCR in all fingers    Lab Results  Component Value Date   WBC 16.5 (H) 01/10/2022   HGB 11.3 (L) 01/10/2022   HCT 33.0 (L) 01/10/2022   MCV 91.2 01/10/2022   PLT 166 01/10/2022     Assessment/Plan:  31 yo RHD M POD 2 s/p right volar forearm and hand fasciotomies and POD 1 s/p right dorsal forearm fasciotomy for hand and forearm compartment syndrome after being found down for unknown length of time.   Continue [place] Please keep RUE elevated to improve pain and swelling Will plan for repeat I&D of right upper extremity tomorrow; timing pending OR availability Please make NPO at midnight    Sherilyn Cooter, MD 01/10/2022, 10:10 AM [phone]

## 2022-01-10 NOTE — Progress Notes (Signed)
PROGRESS NOTE Chris Parker  [id number] DOB: 16-Jun-1990 DOA: 01/08/2022 PCP: Patient, No Pcp Per   Brief Narrative/Hospital Course: 30-yom w/ history of IVDA, found down on the ground on his right side for unclear duration of time,reportedly with needle sticking out of right arm. In ED- in severe rhabdomyolysis w/ compartment syndrome  Rt UE  s/p OR by Dr. Tempie Donning- emergent fasciotomy RUE, placed on aggressive IVF,has SIRS criteria,but without overt underlying infectious source, cxr-right-sided airspace opacities ?asymmetric edema given that the patient was found on his right sidem procal and CTA ordere  placed onemperic broad-spectrum IV antibiotics with IV Vanco and cefepime. Also with hyperkalemia, s/p IV insulin and Lokelma, AKI creat 1.8, metabolci acidosis, transaminitis, w/ elevated troponin discussed w/ Dr. Debara Pickett, who felt that this elevation was less likely represent acute MI, but more likely on the basis of type II supply/demand mismatch- no heparin added but troponin trended and echocardiogram. ABG w/ 7.29/41/158/21. CT abd.pelvis, CT T L spine and Head done as below:1.Inflammatory fat stranding along the anterior and lateral aspect of the lower right chest wall, consistent with the patient's history of recent trauma. Mild patchy right middle lobe and anterior right lower lobe airspace disease, which may represent ill-defined areas of pulmonary contusion. An underlying infectious versus inflammatory process cannot be excluded. Patchy opacities on rt lung-consistent w/ pneumonia No osseus findings on L/T spine 9/23: Due to ongoing pain underwent right dorsal forearm compartment release. Foley inserted due to retention.  Subjective: Seen and examined.  He is alert awake. Moving his right upper extremity and less painful today. Still unable to move right lower extremity, had some diminished sensation mid thigh to below right lower extremity is tender to touch Overnight Tmax 99.1 BP stable  in 150s, heart rate 70s to 80s, left this morning downtrending WBC 16.5, CK 1412, troponin 1223 Creatinine holding at 1.8, calcium 5.9, AST ALT downtrending as well   Assessment and Plan: Principal Problem:   Rhabdomyolysis Active Problems:   Compartment syndrome (HCC)   SIRS (systemic inflammatory response syndrome) (HCC)   Elevated troponin   Hyperkalemia   AKI (acute kidney injury) ([place])   Lactic acidosis   Transaminitis   IV drug abuse (HCC)   Severe Rhabdomyolysis secondary to Prolonged laying on right side Compartment syndrome RUE 2/2 laying on rt side/drug use: Status post emergent fasciectomy of forearm and hand 9/22 and 9/23 (Dr. Tempie Donning).  Continue dressing changes, continue pain control.  Orthopedics following, CK is nicely resolving, having urine output, cont ivf/lasix as per nephro  Rt upper arm and lower extremity weakness also decreased sensation in the right arm Prolonged laying on the right side: CT head no acute findings CT L and T-spine no acute finding.  Most likely from rhabdomyolysis from laying on the right side, but due to drug use/unresponsive on floor discussed with neurology Dr Pat Patrick advised MRI of the brain.   Right sided pneumonia likely aspiration pneumonia/Sepsis POA: Pro-Cal elevated 49, wbc In 26k-imaging w/ rt sided pneumonia.  Follow-up blood culture continue empiric antibiotics.  WBC downtrending temperature curve is stable Recent Labs  Lab 01/08/22 1739 01/08/22 1909 01/09/22 0038 01/09/22 0058 01/10/22 0313  WBC 26.2*  --  19.8*  --  16.5*  LATICACIDVEN 4.1* 2.9*  --  1.3  --   PROCALCITON  --   --   --  49.43  --     Hypocalcemia 5.9, corrected calcium 7.3 due to low element.  Add oral calcium supplement Hyponatremia: Sodium improved  Metabolic acidosis w/ lactic acidosis: Lactic acid resolved.  Bicarb improving at 20 AKI due to rhabdomyolysis: Creatinine holding at 1.8, urine output 747 5 mL.  Nephrology following continue IV  hydration Lasix per nephro Hyperkalemia: Status post temporizing measures insulin.on [facility] per nephrology, potassium stable  Recent Labs  Lab 01/08/22 1739 01/08/22 2208 01/09/22 0058 01/09/22 0654 01/09/22 2138 01/10/22 0313  K 6.1* 5.1 5.7* 4.9 4.6 4.1  CALCIUM 7.9*  --  6.6* 7.0* 6.6* 5.9*  MG 2.0  --  2.1  --   --  1.7  PHOS  --   --  2.7  --   --  3.5   Transaminitis w/ very high AST/ALT/ALP, w/ normal TB: Suspect from rhabdomyolysis.  Pending acute hepatitis panel. right upper quadrant ultrasound-unremarkable liver. lfts downtrending, continue to trend Recent Labs  Lab 01/08/22 1739 01/09/22 0058 01/10/22 0313  AST 1,191* 1,085* 728*  ALT 318* 293* 275*  ALKPHOS 153* 97 82  BILITOT 0.7 0.7 0.7  PROT 7.8 5.4* 5.3*  ALBUMIN 3.4* 2.4* 2.3*  INR 1.2 1.5*  --    Elevated troponin due to demand ischemia from rhabdomyolysis/compartment syndrome, trops [id number]: Dr. Debara Pickett was discussed-no further recommendation, echo with EF 60 to 65%, no RWMA  IVDA admits using fentanyl, urine drug screen negative. Fu blood culture.  High blood pressure no prior history of hypertension likely elevated due to pain.  Continue to address underlying pain. prn antihypertensives if needed  I discussed specifically not to leave [place], as it may lead to incomplete treatment causing significant disability and even death, patient verbalized, nursing at the bedside.  DVT prophylaxis: SCDs Start: 01/08/22 2020 Code Status:   Code Status: Full Code Family Communication: plan of care discussed with patient at bedside.  Patient status is: Inpatient because of severe rhabdomyolysis, compartment syndrome Level of care: Stepdown  Dispo: The patient is from: home            Anticipated disposition: TBD  Mobility Assessment (last 72 hours)     Mobility Assessment   No documentation.         Objective: Vitals last 24 hrs: Vitals:   01/10/22 0300 01/10/22 0400  01/10/22 0500 01/10/22 0600  BP: (!) 152/102 (!) 160/89 (!) 159/91 (!) 151/94  Pulse: 88 81 91 71  Resp: 16 14 (!) 22 14  Temp: 98.7 F (37.1 C)     TempSrc: Oral     SpO2: 97% 96% 100% 98%  Weight:   79 kg   Height:       Weight change: 2.796 kg  Physical Examination: General exam: AAOX3, older than stated age, weak appearing. HEENT:Oral mucosa moist, Ear/Nose WNL grossly, dentition normal. Respiratory system: bilaterally clear, no use of accessory muscle Cardiovascular system: S1 & S2 +, No JVD,. Gastrointestinal system: Abdomen soft,NT,ND,BS+ Nervous System:Alert, awake, unable to move right lower extremity,diminished sensation mid thigh to below  Extremities: LE ankle edema NEG, right lower extremity is tender to touch. Rue W/ dressing/splint in place  Skin: No rashes,no icterus. MSK: Normal muscle bulk,tone, power   Medications reviewed:  Scheduled Meds:  Chlorhexidine Gluconate Cloth  6 each Topical Daily   furosemide  80 mg Intravenous BID   sodium zirconium cyclosilicate  10 g Oral BID   Continuous Infusions:  sodium chloride 100 mL/hr at 01/10/22 0500   ceFEPime (MAXIPIME) IV Stopped (01/10/22 0246)   dexmedetomidine (PRECEDEX) IV infusion Stopped (01/08/22 2322)   vancomycin Stopped (01/09/22 1822)  Diet Order             Diet NPO time specified Except for: Sips with Meds  Diet effective now                  Intake/Output Summary (Last 24 hours) at 01/10/2022 0720 Last data filed at 01/10/2022 0600 Gross per 24 hour  Intake 2853.78 ml  Output 7475 ml  Net -4621.22 ml   Net IO Since Admission: -3,781.22 mL [01/10/22 0720]  Wt Readings from Last 3 Encounters:  01/10/22 79 kg   Unresulted Labs (From admission, onward)     Start     Ordered   01/10/22 1600  Potassium  Once-Timed,   TIMED       Comments: Call if K+ > 5.5   Question:  Specimen collection method  Answer:  Unit=Unit collect   01/09/22 1508   01/10/22 0500  Comprehensive  metabolic panel  Daily at 5am,   R     Question:  Specimen collection method  Answer:  Unit=Unit collect   01/09/22 0808   01/10/22 0500  CBC  Daily at 5am,   R     Question:  Specimen collection method  Answer:  Unit=Unit collect   01/09/22 0808   01/10/22 0500  CK  Daily at 5am,   R     Question:  Specimen collection method  Answer:  Unit=Unit collect   01/09/22 1011   01/09/22 0808  Hepatitis panel, acute  Add-on,   AD       Question:  Specimen collection method  Answer:  Unit=Unit collect   01/09/22 0808   01/09/22 0500  CBC with Differential/Platelet  Tomorrow morning,   R        01/08/22 2021   01/08/22 1709  Urine Culture  (Septic presentation on arrival (screening labs, nursing and treatment orders for obvious sepsis))  ONCE - URGENT,   URGENT       Question:  Indication  Answer:  Sepsis   01/08/22 1713          Data Reviewed: I have personally reviewed following labs and imaging studies CBC: Recent Labs  Lab 01/08/22 1739 01/08/22 2208 01/09/22 0038 01/10/22 0313  WBC 26.2*  --  19.8* 16.5*  NEUTROABS 23.6*  --  17.6*  --   HGB 19.0* 14.6 15.1 11.3*  HCT 54.9* 43.0 44.7 33.0*  MCV 89.4  --  90.3 91.2  PLT 290  --  204 253   Basic Metabolic Panel: Recent Labs  Lab 01/08/22 1739 01/08/22 2208 01/09/22 0058 01/09/22 0654 01/09/22 2138 01/10/22 0313  NA 134* 135 132* 131* 130* 134*  K 6.1* 5.1 5.7* 4.9 4.6 4.1  CL 106  --  109 106 104 107  CO2 16*  --  18* 19* 19* 20*  GLUCOSE 147*  --  150* 132* 199* 177*  BUN 42*  --  42* 47* 44* 40*  CREATININE 1.83*  --  1.60* 1.80* 1.88* 1.85*  CALCIUM 7.9*  --  6.6* 7.0* 6.6* 5.9*  MG 2.0  --  2.1  --   --  1.7  PHOS  --   --  2.7  --   --  3.5   GFR: Estimated Creatinine Clearance: 59.4 mL/min (A) (by C-G formula based on SCr of 1.85 mg/dL (H)). Liver Function Tests: Recent Labs  Lab 01/08/22 1739 01/09/22 0058 01/10/22 0313  AST 1,191* 1,085* 728*  ALT 318* 293* 275*  ALKPHOS 153* 97  82  BILITOT 0.7 0.7  0.7  PROT 7.8 5.4* 5.3*  ALBUMIN 3.4* 2.4* 2.3*   Recent Labs  Lab 01/08/22 1739  LIPASE 35   No results for input(s): "AMMONIA" in the last 168 hours. Coagulation Profile: Recent Labs  Lab 01/08/22 1739 01/09/22 0058  INR 1.2 1.5*   BNP (last 3 results) No results for input(s): "PROBNP" in the last 8760 hours. HbA1C: No results for input(s): "HGBA1C" in the last 72 hours. CBG: Recent Labs  Lab 01/08/22 1959 01/08/22 2347  GLUCAP 119* 144*   Lipid Profile: No results for input(s): "CHOL", "HDL", "LDLCALC", "TRIG", "CHOLHDL", "LDLDIRECT" in the last 72 hours. Thyroid Function Tests: No results for input(s): "TSH", "T4TOTAL", "FREET4", "T3FREE", "THYROIDAB" in the last 72 hours. Sepsis Labs: Recent Labs  Lab 01/08/22 1739 01/08/22 1909 01/09/22 0058  PROCALCITON  --   --  49.43  LATICACIDVEN 4.1* 2.9* 1.3    Recent Results (from the past 240 hour(s))  Blood Culture (routine x 2)     Status: None (Preliminary result)   Collection Time: 01/08/22  5:39 PM   Specimen: BLOOD  Result Value Ref Range Status   Specimen Description   Final    BLOOD RIGHT ANTECUBITAL Performed at [facility], [place] [address], [place], [place] [address]    Special Requests   Final    BOTTLES DRAWN AEROBIC AND ANAEROBIC Blood Culture adequate volume Performed at [facility] [address], [place], [place] [address]    Culture   Final    NO GROWTH < 12 HOURS Performed at [facility] [address], [place], [place] [address]    Report Status PENDING  Incomplete  Resp Panel by RT-PCR (Flu A&B, Covid) Anterior Nasal Swab     Status: None   Collection Time: 01/08/22  5:42 PM   Specimen: Anterior Nasal Swab  Result Value Ref Range Status   SARS Coronavirus 2 by RT PCR NEGATIVE NEGATIVE Final    Comment: (NOTE) SARS-CoV-2 target nucleic acids are NOT DETECTED.  The SARS-CoV-2 RNA is generally detectable in upper respiratory specimens  during the acute phase of infection. The lowest concentration of SARS-CoV-2 viral copies this assay can detect is 138 copies/mL. A negative result does not preclude SARS-Cov-2 infection and should not be used as the sole basis for treatment or other patient management decisions. A negative result may occur with  improper specimen collection/handling, submission of specimen other than nasopharyngeal swab, presence of viral mutation(s) within the areas targeted by this assay, and inadequate number of viral copies(<138 copies/mL). A negative result must be combined with clinical observations, patient history, and epidemiological information. The expected result is Negative.  Fact Sheet for Patients:  EntrepreneurPulse.com.au  Fact Sheet for Healthcare Providers:  IncredibleEmployment.be  This test is no t yet approved or cleared by the [place] FDA and  has been authorized for detection and/or diagnosis of SARS-CoV-2 by FDA under an Emergency Use Authorization (EUA). This EUA will remain  in effect (meaning this test can be used) for the duration of the COVID-19 declaration under Section 564(b)(1) of the Act, 21 U.S.C.section 360bbb-3(b)(1), unless the authorization is terminated  or revoked sooner.       Influenza A by PCR NEGATIVE NEGATIVE Final   Influenza B by PCR NEGATIVE NEGATIVE Final    Comment: (NOTE) The Xpert Xpress SARS-CoV-2/FLU/RSV plus assay is intended as an aid in the diagnosis of influenza from Nasopharyngeal swab specimens and should not  be used as a sole basis for treatment. Nasal washings and aspirates are unacceptable for Xpert Xpress SARS-CoV-2/FLU/RSV testing.  Fact Sheet for Patients: BloggerCourse.com  Fact Sheet for Healthcare Providers: SeriousBroker.it  This test is not yet approved or cleared by the [place] FDA and has been authorized for detection  and/or diagnosis of SARS-CoV-2 by FDA under an Emergency Use Authorization (EUA). This EUA will remain in effect (meaning this test can be used) for the duration of the COVID-19 declaration under Section 564(b)(1) of the Act, 21 U.S.C. section 360bbb-3(b)(1), unless the authorization is terminated or revoked.  Performed at [facility], [address], [place], [place] [address]   Blood Culture (routine x 2)     Status: None (Preliminary result)   Collection Time: 01/08/22  6:00 PM   Specimen: BLOOD  Result Value Ref Range Status   Specimen Description   Final    BLOOD LEFT ANTECUBITAL Performed at [facility], [address] [place], [place] [address]    Special Requests   Final    BOTTLES DRAWN AEROBIC AND ANAEROBIC Blood Culture adequate volume Performed at [facility], [address] [place], [place] [address]    Culture   Final    NO GROWTH < 12 HOURS Performed at [facility], [address] [place], [place] [address]    Report Status PENDING  Incomplete  MRSA Next Gen by PCR, Nasal     Status: None   Collection Time: 01/09/22  2:53 AM   Specimen: Nasal Mucosa; Nasal Swab  Result Value Ref Range Status   MRSA by PCR Next Gen NOT DETECTED NOT DETECTED Final    Comment: (NOTE) The GeneXpert MRSA Assay (FDA approved for NASAL specimens only), is one component of a comprehensive MRSA colonization surveillance program. It is not intended to diagnose MRSA infection nor to guide or monitor treatment for MRSA infections. Test performance is not FDA approved in patients less than 60 years old. Performed at [facility], [address] [place], [place] [address]     Antimicrobials: Anti-infectives (From admission, onward)    Start     Dose/Rate Route Frequency Ordered Stop   01/09/22 1600  vancomycin (VANCOREADY) IVPB 1500 mg/300 mL        1,500 mg 150 mL/hr over 120 Minutes Intravenous  Every 24 hours 01/09/22 0043     01/09/22 0200  ceFEPIme (MAXIPIME) 2 g in sodium chloride 0.9 % 100 mL IVPB        2 g 200 mL/hr over 30 Minutes Intravenous Every 8 hours 01/09/22 0043     01/08/22 1715  ceFEPIme (MAXIPIME) 2 g in sodium chloride 0.9 % 100 mL IVPB        2 g 200 mL/hr over 30 Minutes Intravenous  Once 01/08/22 1713 01/08/22 1819   01/08/22 1715  metroNIDAZOLE (FLAGYL) IVPB 500 mg        500 mg 100 mL/hr over 60 Minutes Intravenous  Once 01/08/22 1713 01/08/22 1833   01/08/22 1715  vancomycin (VANCOCIN) IVPB 1000 mg/200 mL premix        1,000 mg 200 mL/hr over 60 Minutes Intravenous  Once 01/08/22 1713 01/08/22 2019      Culture/Microbiology    Component Value Date/Time   SDES  01/08/2022 1800    BLOOD LEFT ANTECUBITAL Performed at [facility], [address], [place], [place] [address]    SPECREQUEST  01/08/2022 1800  BOTTLES DRAWN AEROBIC AND ANAEROBIC Blood Culture adequate volume Performed at [facility] [address] [place], [place] [address]    CULT  01/08/2022 1800    NO GROWTH < 12 HOURS Performed at [facility] [address], [place],  [address]    REPTSTATUS PENDING 01/08/2022 1800    Other culture-see note  Radiology Studies: DG CHEST PORT 1 VIEW  Result Date: 01/09/2022 CLINICAL DATA:  Pneumonia EXAM: PORTABLE CHEST 1 VIEW COMPARISON:  01/08/2022 FINDINGS: There is partial clearing of infiltrate in right lung. There is residual alveolar infiltrate in right upper lung fields. Left lung is clear. There is no pleural effusion or pneumothorax. IMPRESSION: There is partial clearing of infiltrates in right lung suggesting resolving pneumonia. Residual alveolar infiltrates are noted in right upper lung field. There is no pleural effusion. Electronically Signed   By: Elmer Picker M.D.   On: 01/09/2022 15:28   ECHOCARDIOGRAM COMPLETE  Result Date: 01/09/2022    ECHOCARDIOGRAM REPORT    Patient Name:   TIGE COSTILOW Date of Exam: 01/09/2022 Medical Rec #:  [id number]   Height:       69.0 in Accession #:    [id number]  Weight:       173.1 lb Date of Birth:  [birth year]  BSA:          1.943 m Patient Age:    30 years    BP:           163/99 mmHg Patient Gender: M           HR:           79 bpm. Exam Location:  Inpatient Procedure: 2D Echo Indications:    elevated troponins  History:        Patient has no prior history of Echocardiogram examinations.  Sonographer:    Harvie Junior Referring Phys: [id number] [place]  1. Left ventricular ejection fraction, by estimation, is 60 to 65%. The left ventricle has normal function. The left ventricle has no regional wall motion abnormalities. Left ventricular diastolic parameters were normal.  2. Right ventricular systolic function is normal. The right ventricular size is normal. There is normal pulmonary artery systolic pressure.  3. The mitral valve is normal in structure. No evidence of mitral valve regurgitation.  4. The aortic valve was not well visualized. Aortic valve regurgitation is not visualized. Comparison(s): No prior Echocardiogram. Conclusion(s)/Recommendation(s): Normal biventricular function without evidence of hemodynamically significant valvular heart disease. FINDINGS  Left Ventricle: Left ventricular ejection fraction, by estimation, is 60 to 65%. The left ventricle has normal function. The left ventricle has no regional wall motion abnormalities. The left ventricular internal cavity size was normal in size. There is  no left ventricular hypertrophy. Left ventricular diastolic parameters were normal. Right Ventricle: The right ventricular size is normal. No increase in right ventricular wall thickness. Right ventricular systolic function is normal. There is normal pulmonary artery systolic pressure. The tricuspid regurgitant velocity is 2.76 m/s, and  with an assumed right atrial pressure of 3 mmHg, the estimated right  ventricular systolic pressure is [id number] mmHg. Left Atrium: Left atrial size was normal in size. Right Atrium: Right atrial size was normal in size. Pericardium: There is no evidence of pericardial effusion. Mitral Valve: The mitral valve is normal in structure. No evidence of mitral valve regurgitation. Tricuspid Valve: The tricuspid valve is normal in structure. Tricuspid valve regurgitation is not demonstrated. Aortic Valve: The aortic valve was not  well visualized. Aortic valve regurgitation is not visualized. Aortic valve mean gradient measures 5.0 mmHg. Aortic valve peak gradient measures 8.9 mmHg. Aortic valve area, by VTI measures 2.51 cm. Pulmonic Valve: The pulmonic valve was not well visualized. Pulmonic valve regurgitation is not visualized. Aorta: The aortic root and ascending aorta are structurally normal, with no evidence of dilitation. IAS/Shunts: The interatrial septum was not well visualized.  LEFT VENTRICLE PLAX 2D LVIDd:         4.80 cm     Diastology LVIDs:         3.40 cm     LV e' medial:    14.70 cm/s LV PW:         0.90 cm     LV E/e' medial:  7.6 LV IVS:        0.70 cm     LV e' lateral:   23.40 cm/s LVOT diam:     2.00 cm     LV E/e' lateral: 4.7 LV SV:         58 LV SV Index:   30 LVOT Area:     3.14 cm  LV Volumes (MOD) LV vol d, MOD A2C: 80.6 ml LV vol d, MOD A4C: 87.5 ml LV vol s, MOD A2C: 34.5 ml LV vol s, MOD A4C: 38.7 ml LV SV MOD A2C:     46.1 ml LV SV MOD A4C:     87.5 ml LV SV MOD BP:      50.1 ml RIGHT VENTRICLE RV Basal diam:  3.80 cm RV Mid diam:    3.50 cm RV S prime:     21.80 cm/s TAPSE (M-mode): 2.3 cm LEFT ATRIUM             Index        RIGHT ATRIUM           Index LA diam:        2.90 cm 1.49 cm/m   RA Area:     12.00 cm LA Vol (A2C):   36.7 ml 18.89 ml/m  RA Volume:   26.00 ml  13.38 ml/m LA Vol (A4C):   38.6 ml 19.87 ml/m LA Biplane Vol: 38.1 ml 19.61 ml/m  AORTIC VALVE                    PULMONIC VALVE AV Area (Vmax):    2.74 cm     PV Vmax:       1.34 m/s AV  Area (Vmean):   2.55 cm     PV Peak grad:  7.2 mmHg AV Area (VTI):     2.51 cm AV Vmax:           149.00 cm/s AV Vmean:          96.900 cm/s AV VTI:            0.230 m AV Peak Grad:      8.9 mmHg AV Mean Grad:      5.0 mmHg LVOT Vmax:         130.00 cm/s LVOT Vmean:        78.500 cm/s LVOT VTI:          0.184 m LVOT/AV VTI ratio: 0.80  AORTA Ao Root diam: 3.30 cm Ao Asc diam:  3.10 cm MITRAL VALVE                TRICUSPID VALVE MV Area (PHT): 4.21 cm     TR Peak grad:  30.5 mmHg MV Decel Time: 180 msec     TR Vmax:        276.00 cm/s MV E velocity: 111.00 cm/s MV A velocity: 62.10 cm/s   SHUNTS MV E/A ratio:  1.79         Systemic VTI:  0.18 m                             Systemic Diam: 2.00 cm Phineas Inches Electronically signed by Phineas Inches Signature Date/Time: 01/09/2022/1:27:33 PM    Final    US Abdomen Limited RUQ (LIVER/GB)  Result Date: 01/09/2022 CLINICAL DATA:  31 year old male with abnormal LFTs. EXAM: ULTRASOUND ABDOMEN LIMITED RIGHT UPPER QUADRANT COMPARISON:  01/08/2022 CT FINDINGS: Gallbladder: A small amount of pericholecystic fluid is noted. There is no evidence of cholelithiasis, gallbladder wall thickening or definite sonographic Murphy sign. Common bile duct: Diameter: 3.5 mm. There is no evidence of intrahepatic or extrahepatic biliary dilatation. Liver: No focal lesion identified. Within normal limits in parenchymal echogenicity. Portal vein is patent on color Doppler imaging with normal direction of blood flow towards the liver. Other: None. IMPRESSION: 1. Small amount of thick nonspecific pericholecystic fluid without cholelithiasis or other evidence of acute cholecystitis. 2. Unremarkable liver.  No evidence of biliary dilatation. Electronically Signed   By: Margarette Canada M.D.   On: 01/09/2022 10:42   CT L-SPINE NO CHARGE  Result Date: 01/08/2022 CLINICAL DATA:  Altered mental status, trauma EXAM: CT LUMBAR SPINE WITHOUT CONTRAST TECHNIQUE: Multidetector CT imaging of the lumbar spine  was performed without intravenous contrast administration. Multiplanar CT image reconstructions were also generated. RADIATION DOSE REDUCTION: This exam was performed according to the departmental dose-optimization program which includes automated exposure control, adjustment of the mA and/or kV according to patient size and/or use of iterative reconstruction technique. COMPARISON:  None Available. FINDINGS: Segmentation: 5 lumbar type vertebrae. Alignment: Straightening of the lumbar spine. Vertebrae: No acute fracture or focal pathologic process. Small Schmorl node in the superior endplate of L2 vertebral body. Paraspinal and other soft tissues: Negative. Disc levels: Disc heights are maintained. Significant disc bulge, spinal canal or neural foraminal stenosis. Mild multilevel facet joint arthropathy. IMPRESSION: 1.  No acute fracture or traumatic subluxation. 2.  Mild multiple facet joint arthropathy Electronically Signed   By: Keane Police D.O.   On: 01/08/2022 22:07   CT T-SPINE NO CHARGE  Result Date: 01/08/2022 CLINICAL DATA:  Initial evaluation for you attic behavior, fentanyl use, swelling to right hand and face. EXAM: CT THORACIC SPINE WITHOUT CONTRAST TECHNIQUE: Multidetector CT images of the thoracic were obtained using the standard protocol without intravenous contrast. RADIATION DOSE REDUCTION: This exam was performed according to the departmental dose-optimization program which includes automated exposure control, adjustment of the mA and/or kV according to patient size and/or use of iterative reconstruction technique. COMPARISON:  None Available. FINDINGS: Alignment: Physiologic with preservation of the normal thoracic kyphosis. No listhesis. Vertebrae: Vertebral body height maintained without acute or chronic fracture. Visualized ribs intact. No worrisome osseous lesions. No evidence for osteomyelitis discitis or septic arthritis by CT. Paraspinal and other soft tissues: Paraspinous soft  tissues within normal limits. Patchy opacities within the visualized right lung, consistent with pneumonia. Disc levels: Unremarkable. IMPRESSION: 1. No acute osseous abnormality within the thoracic spine. 2. Patchy opacities within the visualized right lung, consistent with pneumonia. Electronically Signed   By: Jeannine Boga M.D.   On: 01/08/2022 22:03   CT  ABDOMEN PELVIS W CONTRAST  Result Date: 01/08/2022 CLINICAL DATA:  Status post trauma. EXAM: CT ABDOMEN AND PELVIS WITH CONTRAST TECHNIQUE: Multidetector CT imaging of the abdomen and pelvis was performed using the standard protocol following bolus administration of intravenous contrast. RADIATION DOSE REDUCTION: This exam was performed according to the departmental dose-optimization program which includes automated exposure control, adjustment of the mA and/or kV according to patient size and/or use of iterative reconstruction technique. CONTRAST:  32mL OMNIPAQUE IOHEXOL 350 MG/ML SOLN COMPARISON:  None Available. FINDINGS: Lower chest: Mild patchy right middle lobe and anterior right lower lobe airspace disease is noted. Hepatobiliary: No focal liver abnormality is seen. No gallstones, gallbladder wall thickening, or biliary dilatation. Pancreas: Unremarkable. No pancreatic ductal dilatation or surrounding inflammatory changes. Spleen: Normal in size without focal abnormality. Adrenals/Urinary Tract: Adrenal glands are unremarkable. Kidneys are normal, without renal calculi, focal lesion, or hydronephrosis. Bladder is unremarkable. Stomach/Bowel: Stomach is within normal limits. Appendix appears normal. No evidence of bowel wall thickening, distention, or inflammatory changes. Vascular/Lymphatic: No significant vascular findings are present. No enlarged abdominal or pelvic lymph nodes. Reproductive: Prostate is unremarkable. Other: No abdominal wall hernia.  No abdominopelvic ascites. Musculoskeletal: Mild to moderate severity subcutaneous and para  muscular inflammatory fat stranding is seen along the anterior and lateral aspect of the lower right chest wall. No acute osseous abnormalities are identified. IMPRESSION: 1. Inflammatory fat stranding along the anterior and lateral aspect of the lower right chest wall, consistent with the patient's history of recent trauma. 2. Mild patchy right middle lobe and anterior right lower lobe airspace disease, which may represent ill-defined areas of pulmonary contusion. An underlying infectious versus inflammatory process cannot be excluded. Electronically Signed   By: Aram Candela M.D.   On: 01/08/2022 21:46   CT Angio Chest PE W and/or Wo Contrast  Result Date: 01/08/2022 CLINICAL DATA:  Sepsis. IV drug use. Right upper extremity and facial swelling. Altered mental status. High probability for pulmonary embolism. EXAM: CT ANGIOGRAPHY CHEST WITH CONTRAST TECHNIQUE: Multidetector CT imaging of the chest was performed using the standard protocol during bolus administration of intravenous contrast. Multiplanar CT image reconstructions and MIPs were obtained to evaluate the vascular anatomy. RADIATION DOSE REDUCTION: This exam was performed according to the departmental dose-optimization program which includes automated exposure control, adjustment of the mA and/or kV according to patient size and/or use of iterative reconstruction technique. CONTRAST:  73mL OMNIPAQUE IOHEXOL 350 MG/ML SOLN COMPARISON:  None Available. FINDINGS: Cardiovascular: Satisfactory opacification of pulmonary arteries noted, and no pulmonary emboli identified. No evidence of thoracic aortic dissection or aneurysm. Mediastinum/Nodes: No masses or pathologically enlarged lymph nodes identified. Lungs/Pleura: Right lung airspace opacity is seen, greatest in the right upper lobe, consistent with pneumonia. No evidence of mass or pleural effusion. Upper abdomen: No acute findings. Musculoskeletal: No suspicious bone lesions identified. Swelling  and edema is seen involving the right lateral chest wall muscles and subcutaneous tissues. Review of the MIP images confirms the above findings. IMPRESSION: No evidence of pulmonary embolism. Right lung airspace opacity, greatest in right upper lobe, consistent with pneumonia. Swelling and edema involving right lateral chest wall muscles and subcutaneous tissues. Electronically Signed   By: Danae Orleans M.D.   On: 01/08/2022 21:41   CT Cervical Spine Wo Contrast  Result Date: 01/08/2022 CLINICAL DATA:  Patient acting erratic with swollen right hand and right face EXAM: CT HEAD WITHOUT CONTRAST CT CERVICAL SPINE WITHOUT CONTRAST TECHNIQUE: Multidetector CT imaging of the head and cervical spine  was performed following the standard protocol without intravenous contrast. Multiplanar CT image reconstructions of the cervical spine were also generated. RADIATION DOSE REDUCTION: This exam was performed according to the departmental dose-optimization program which includes automated exposure control, adjustment of the mA and/or kV according to patient size and/or use of iterative reconstruction technique. COMPARISON:  None Available. FINDINGS: CT HEAD FINDINGS Exam is mildly compromised by motion artifact. Brain: No intracranial hemorrhage, mass effect, or evidence of acute infarct. No hydrocephalus. No extra-axial fluid collection. Vascular: No hyperdense vessel or unexpected calcification. Skull: No fracture or focal lesion. Sinuses/Orbits: No acute finding. Paranasal sinuses and mastoid air cells are well aerated. Other: None. CT CERVICAL SPINE FINDINGS Exam is mildly compromised by motion artifact. Alignment: Normal. Skull base and vertebrae: No definite acute fracture. Mild anterior wedging of C6 is presumed chronic though technically age indeterminate. Irregularity through the C4 vertebral body corresponds to area of motion and is artifactual. No primary bone lesion or focal pathologic process. Soft tissues and  spinal canal: No prevertebral fluid or swelling. No visible canal hematoma. Disc levels: No significant spondylosis or disc space height loss. No high-grade spinal canal or neural foraminal narrowing. Upper chest: Negative. Other: None. IMPRESSION: 1. No acute intracranial abnormality or calvarial fracture. 2. No definite acute fracture in the cervical spine. Age indeterminate wedging of C6 is presumed chronic. Electronically Signed   By: Placido Sou M.D.   On: 01/08/2022 21:39   CT HEAD WO CONTRAST (5MM)  Result Date: 01/08/2022 CLINICAL DATA:  Patient acting erratic with swollen right hand and right face EXAM: CT HEAD WITHOUT CONTRAST CT CERVICAL SPINE WITHOUT CONTRAST TECHNIQUE: Multidetector CT imaging of the head and cervical spine was performed following the standard protocol without intravenous contrast. Multiplanar CT image reconstructions of the cervical spine were also generated. RADIATION DOSE REDUCTION: This exam was performed according to the departmental dose-optimization program which includes automated exposure control, adjustment of the mA and/or kV according to patient size and/or use of iterative reconstruction technique. COMPARISON:  None Available. FINDINGS: CT HEAD FINDINGS Exam is mildly compromised by motion artifact. Brain: No intracranial hemorrhage, mass effect, or evidence of acute infarct. No hydrocephalus. No extra-axial fluid collection. Vascular: No hyperdense vessel or unexpected calcification. Skull: No fracture or focal lesion. Sinuses/Orbits: No acute finding. Paranasal sinuses and mastoid air cells are well aerated. Other: None. CT CERVICAL SPINE FINDINGS Exam is mildly compromised by motion artifact. Alignment: Normal. Skull base and vertebrae: No definite acute fracture. Mild anterior wedging of C6 is presumed chronic though technically age indeterminate. Irregularity through the C4 vertebral body corresponds to area of motion and is artifactual. No primary bone lesion  or focal pathologic process. Soft tissues and spinal canal: No prevertebral fluid or swelling. No visible canal hematoma. Disc levels: No significant spondylosis or disc space height loss. No high-grade spinal canal or neural foraminal narrowing. Upper chest: Negative. Other: None. IMPRESSION: 1. No acute intracranial abnormality or calvarial fracture. 2. No definite acute fracture in the cervical spine. Age indeterminate wedging of C6 is presumed chronic. Electronically Signed   By: Placido Sou M.D.   On: 01/08/2022 21:39   DG Hand Complete Left  Result Date: 01/08/2022 CLINICAL DATA:  Hand swelling EXAM: LEFT HAND - COMPLETE 3+ VIEW COMPARISON:  None Available. FINDINGS: There is no evidence of fracture or dislocation. There is no evidence of arthropathy or other focal bone abnormality. Soft tissues are unremarkable. IMPRESSION: Negative. Electronically Signed   By: Davina Poke D.O.  On: 01/08/2022 18:28   DG Hand Complete Right  Result Date: 01/08/2022 CLINICAL DATA:  Right hand swelling with cutaneous blisters EXAM: RIGHT HAND - COMPLETE 3+ VIEW COMPARISON:  None Available. FINDINGS: There is no evidence of fracture or dislocation. No erosion or periosteal elevation. There is no evidence of arthropathy or other focal bone abnormality. Generalized soft tissue swelling. No soft tissue gas. IMPRESSION: 1. No acute osseous abnormality. 2. Generalized soft tissue swelling. Electronically Signed   By: Davina Poke D.O.   On: 01/08/2022 18:28   DG Chest Port 1 View  Result Date: 01/08/2022 CLINICAL DATA:  Possible sepsis.  Overdose EXAM: PORTABLE CHEST 1 VIEW COMPARISON:  None Available. FINDINGS: The heart size and mediastinal contours are within normal limits. Diffuse interstitial and alveolar opacities throughout the right lung. Relatively mild interstitial prominence within the left lung. No pleural effusion or pneumothorax. The visualized skeletal structures are unremarkable. IMPRESSION:  Diffuse airspace opacities throughout the right lung with mild interstitial prominence within the left lung. Findings may represent multifocal pneumonia or asymmetric edema. Electronically Signed   By: Davina Poke D.O.   On: 01/08/2022 18:27     LOS: 2 days   Antonieta Pert, MD [facility]  01/10/2022, 7:20 AM

## 2022-01-10 NOTE — Progress Notes (Signed)
[facility] Progress Note  Subjective: good response to lasix w/ 5275 cc UOP yesterday and 2800 cc so far today. Creat stable 1.8.  CPK down to 1400 from >50,000 yesterday. Pt looks better, not in distress. Went back to OR yest for another compartment syndrome surgery.    Vitals:   01/10/22 0700 01/10/22 0800 01/10/22 0851 01/10/22 0900  BP: (!) 153/90 (!) 136/97  (!) 154/85  Pulse: 77 73 72 72  Resp: 15 15 (!) 22 17  Temp:   98.3 F (36.8 C)   TempSrc:   Oral   SpO2: 100% 97% 100% 97%  Weight:      Height:        Exam: Gen alert, no distress, nasal O2 No rash, cyanosis or gangrene Sclera anicteric, throat clear  No jvd or bruits Chest clear bilat to bases, no rales/ wheezing RRR no MRG Abd soft ntnd no mass or ascites +bs GU normal male MS no joint effusions or deformity Ext R arm wrapped, bilat LE's w/o edema, no other edema Neuro is alert, Ox 3 , nf    Home meds include - none     UA 9/22 - negative   UNa 33,  UCr 64    +IV contrast 75 cc w/ CT scan 9 pm last night     BP 160/ 95,  HR 95-100, RR 12-18  , temp 101.9 max > 98.7 this am     2L [place] 97%     CXR 9/22 6pm - bilat R > L IS infiltrates, edema vs other     CT abd 9/22 - Urinary tract- kidneys appear normal, without renal calculi, focal lesion, or hydronephrosis. Bladder is unremarkable.   Assessment/ Plan: AKI - no old labs. Creat here 1.8 on admission in setting of acute rhabdomyolysis related to drug OD and compression muscle injury. CT abd shows normal kidneys w/o obstruction. UA normal, urine lytes c/w ATN. Pt was admitted and rec'd 4.5 L bolus in ED + LR at 150 cc/hr. He went to OR then for compartment syndrome. Saw patient yesterday 9/23 and was in pain and went back to OR for another compartment syndrome surgery. CXR looked wet yesterday, so we lowered IVF"s and gave IV lasix 80mg  x 1. Had very good response w/ 5.2 L UOP yesterday. Remains somewhat vol overload today w/ LE edema but looks  better overall. Creat stable at 1.8. CXR 50% better today, no resp c/o's. CPK is down from 50,000 yest to 1400, so no longer at risk of further AKI from rhabdo. Will dc IVF"s at 100/hr, and cont IV lasix at lower dose 60 mg bid. Also, would dc IV vanc if possible given AKI, will d/w pmd. Will follow.  Acute rhabdomyolysis - due to drug OD and compression injury. CPK > 50,000 down to 1400 today. As above.  Hyperkalemia - resolved w/ lokelma + resolution of acute rhabdo episode, will dc lokelma.  Compartment syndrome - per orthopedic surgeons    Rob Alveria Mcglaughlin 01/10/2022, 12:51 PM   Recent Labs  Lab 01/09/22 0038 01/09/22 0058 01/09/22 0654 01/09/22 2138 01/10/22 0313  HGB 15.1  --   --   --  11.3*  ALBUMIN  --  2.4*  --   --  2.3*  CALCIUM  --  6.6*   < > 6.6* 5.9*  PHOS  --  2.7  --   --  3.5  CREATININE  --  1.60*   < > 1.88* 1.85*  K  --  5.7*   < > 4.6 4.1   < > = values in this interval not displayed.   No results for input(s): "IRON", "TIBC", "FERRITIN" in the last 168 hours. Inpatient medications:  calcium carbonate  1 tablet Oral TID   Chlorhexidine Gluconate Cloth  6 each Topical Daily   furosemide  60 mg Intravenous BID   sodium zirconium cyclosilicate  10 g Oral BID    ceFEPime (MAXIPIME) IV Stopped (01/10/22 1111)   dexmedetomidine (PRECEDEX) IV infusion Stopped (01/08/22 2322)   vancomycin Stopped (01/09/22 1822)   acetaminophen **OR** acetaminophen, hydrALAZINE, HYDROmorphone (DILAUDID) injection, naLOXone (NARCAN)  injection, ondansetron (ZOFRAN) IV, mouth rinse, oxyCODONE

## 2022-01-11 ENCOUNTER — Encounter (HOSPITAL_COMMUNITY)
Admission: EM | Disposition: A | Discharge status: Home Health | Payer: Self-pay | Source: Home / Self Care | Attending: Internal Medicine

## 2022-01-11 ENCOUNTER — Other Ambulatory Visit: Payer: Self-pay

## 2022-01-11 ENCOUNTER — Inpatient Hospital Stay (HOSPITAL_COMMUNITY): Payer: Medicaid Other | Admitting: Anesthesiology

## 2022-01-11 ENCOUNTER — Encounter (HOSPITAL_COMMUNITY): Payer: Self-pay | Admitting: Internal Medicine

## 2022-01-11 DIAGNOSIS — T79A11A Traumatic compartment syndrome of right upper extremity, initial encounter: Secondary | ICD-10-CM | POA: Diagnosis not present

## 2022-01-11 DIAGNOSIS — N289 Disorder of kidney and ureter, unspecified: Secondary | ICD-10-CM | POA: Diagnosis not present

## 2022-01-11 DIAGNOSIS — F17210 Nicotine dependence, cigarettes, uncomplicated: Secondary | ICD-10-CM

## 2022-01-11 DIAGNOSIS — M6282 Rhabdomyolysis: Secondary | ICD-10-CM | POA: Diagnosis not present

## 2022-01-11 HISTORY — PX: I & D EXTREMITY: SHX5045

## 2022-01-11 LAB — CBC
HCT: 37.3 % — ABNORMAL LOW (ref 39.0–52.0)
Hemoglobin: 13.2 g/dL (ref 13.0–17.0)
MCH: 31.1 pg (ref 26.0–34.0)
MCHC: 35.4 g/dL (ref 30.0–36.0)
MCV: 88 fL (ref 80.0–100.0)
Platelets: 198 10*3/uL (ref 150–400)
RBC: 4.24 MIL/uL (ref 4.22–5.81)
RDW: 12.9 % (ref 11.5–15.5)
WBC: 21.3 10*3/uL — ABNORMAL HIGH (ref 4.0–10.5)
nRBC: 0 % (ref 0.0–0.2)

## 2022-01-11 LAB — COMPREHENSIVE METABOLIC PANEL
ALT: 253 U/L — ABNORMAL HIGH (ref 0–44)
AST: 498 U/L — ABNORMAL HIGH (ref 15–41)
Albumin: 2.4 g/dL — ABNORMAL LOW (ref 3.5–5.0)
Alkaline Phosphatase: 94 U/L (ref 38–126)
Anion gap: 9 (ref 5–15)
BUN: 48 mg/dL — ABNORMAL HIGH (ref 6–20)
CO2: 23 mmol/L (ref 22–32)
Calcium: 7.3 mg/dL — ABNORMAL LOW (ref 8.9–10.3)
Chloride: 99 mmol/L (ref 98–111)
Creatinine, Ser: 2.29 mg/dL — ABNORMAL HIGH (ref 0.61–1.24)
GFR, Estimated: 39 mL/min — ABNORMAL LOW (ref >60)
Glucose, Bld: 178 mg/dL — ABNORMAL HIGH (ref 70–99)
Potassium: 3.3 mmol/L — ABNORMAL LOW (ref 3.5–5.1)
Sodium: 131 mmol/L — ABNORMAL LOW (ref 135–145)
Total Bilirubin: 0.8 mg/dL (ref 0.3–1.2)
Total Protein: 5.8 g/dL — ABNORMAL LOW (ref 6.5–8.1)

## 2022-01-11 LAB — HCV RNA QUANT
HCV Quantitative Log: 5.439 log10 IU/mL (ref >1.70)
HCV Quantitative: 275000 IU/mL (ref >50)

## 2022-01-11 LAB — CK: Total CK: 19896 U/L — ABNORMAL HIGH (ref 49–397)

## 2022-01-11 SURGERY — IRRIGATION AND DEBRIDEMENT EXTREMITY
Anesthesia: General | Laterality: Right

## 2022-01-11 MED ORDER — PROPOFOL 500 MG/50ML IV EMUL
INTRAVENOUS | Status: DC | PRN
  Administered 2022-01-11: 200 mg via INTRAVENOUS

## 2022-01-11 MED ORDER — SODIUM CHLORIDE 0.9 % IV SOLN
2.0000 g | Freq: Two times a day (BID) | INTRAVENOUS | Status: AC
  Administered 2022-01-11 – 2022-01-15 (×9): 2 g via INTRAVENOUS
  Filled 2022-01-11 (×9): qty 12.5

## 2022-01-11 MED ORDER — POTASSIUM CHLORIDE CRYS ER 20 MEQ PO TBCR
40.0000 meq | EXTENDED_RELEASE_TABLET | Freq: Once | ORAL | Status: AC
  Administered 2022-01-11: 40 meq via ORAL
  Filled 2022-01-11: qty 2

## 2022-01-11 MED ORDER — CHLORHEXIDINE GLUCONATE 0.12 % MT SOLN
15.0000 mL | Freq: Once | OROMUCOSAL | Status: AC
  Administered 2022-01-11: 15 mL via OROMUCOSAL

## 2022-01-11 MED ORDER — HYDROMORPHONE HCL 1 MG/ML IJ SOLN
1.0000 mg | Freq: Once | INTRAMUSCULAR | Status: AC
  Administered 2022-01-11: 1 mg via INTRAVENOUS
  Filled 2022-01-11: qty 1

## 2022-01-11 MED ORDER — MEPERIDINE HCL 50 MG/ML IJ SOLN: 6.2500 mg | INTRAMUSCULAR | Status: DC | PRN

## 2022-01-11 MED ORDER — PROPOFOL 500 MG/50ML IV EMUL
INTRAVENOUS | Status: AC
  Filled 2022-01-11: qty 50

## 2022-01-11 MED ORDER — DEXAMETHASONE SODIUM PHOSPHATE 10 MG/ML IJ SOLN
INTRAMUSCULAR | Status: AC
  Filled 2022-01-11: qty 1

## 2022-01-11 MED ORDER — MIDAZOLAM HCL 2 MG/2ML IJ SOLN
INTRAMUSCULAR | Status: DC | PRN
  Administered 2022-01-11: 2 mg via INTRAVENOUS

## 2022-01-11 MED ORDER — SODIUM CHLORIDE 0.9 % IV SOLN: INTRAVENOUS | Status: DC | PRN

## 2022-01-11 MED ORDER — AMISULPRIDE (ANTIEMETIC) 5 MG/2ML IV SOLN: 10.0000 mg | Freq: Once | INTRAVENOUS | Status: DC | PRN

## 2022-01-11 MED ORDER — OXYCODONE HCL 5 MG PO TABS: 5.0000 mg | ORAL_TABLET | Freq: Once | ORAL | Status: DC | PRN

## 2022-01-11 MED ORDER — SODIUM CHLORIDE 0.9 % IV SOLN: INTRAVENOUS | Status: DC

## 2022-01-11 MED ORDER — ONDANSETRON HCL 4 MG/2ML IJ SOLN
INTRAMUSCULAR | Status: DC | PRN
  Administered 2022-01-11: 4 mg via INTRAVENOUS

## 2022-01-11 MED ORDER — FENTANYL CITRATE (PF) 100 MCG/2ML IJ SOLN
INTRAMUSCULAR | Status: AC
  Filled 2022-01-11: qty 2

## 2022-01-11 MED ORDER — ONDANSETRON HCL 4 MG/2ML IJ SOLN
INTRAMUSCULAR | Status: AC
  Filled 2022-01-11: qty 2

## 2022-01-11 MED ORDER — OXYCODONE HCL 5 MG/5ML PO SOLN: 5.0000 mg | Freq: Once | ORAL | Status: DC | PRN

## 2022-01-11 MED ORDER — ORAL CARE MOUTH RINSE: 15.0000 mL | Freq: Once | OROMUCOSAL | Status: AC

## 2022-01-11 MED ORDER — SODIUM CHLORIDE 0.9 % IR SOLN
Status: DC | PRN
  Administered 2022-01-11: 3000 mL

## 2022-01-11 MED ORDER — DEXAMETHASONE SODIUM PHOSPHATE 10 MG/ML IJ SOLN
INTRAMUSCULAR | Status: DC | PRN
  Administered 2022-01-11: 10 mg via INTRAVENOUS

## 2022-01-11 MED ORDER — HYDROMORPHONE HCL 1 MG/ML IJ SOLN: 0.2500 mg | INTRAMUSCULAR | Status: DC | PRN

## 2022-01-11 MED ORDER — FENTANYL CITRATE (PF) 100 MCG/2ML IJ SOLN
INTRAMUSCULAR | Status: DC | PRN
  Administered 2022-01-11: 25 ug via INTRAVENOUS
  Administered 2022-01-11: 50 ug via INTRAVENOUS
  Administered 2022-01-11: 25 ug via INTRAVENOUS

## 2022-01-11 MED ORDER — BUPIVACAINE HCL 0.25 % IJ SOLN
INTRAMUSCULAR | Status: AC
  Filled 2022-01-11: qty 1

## 2022-01-11 MED ORDER — SODIUM CHLORIDE 0.9 % IV SOLN: INTRAVENOUS | Status: AC

## 2022-01-11 MED ORDER — LACTATED RINGERS IV SOLN: INTRAVENOUS | Status: DC | PRN

## 2022-01-11 MED ORDER — PROMETHAZINE HCL 25 MG/ML IJ SOLN: 6.2500 mg | INTRAMUSCULAR | Status: DC | PRN

## 2022-01-11 MED ORDER — 0.9 % SODIUM CHLORIDE (POUR BTL) OPTIME
TOPICAL | Status: DC | PRN
  Administered 2022-01-11: 1000 mL

## 2022-01-11 MED ORDER — MIDAZOLAM HCL 2 MG/2ML IJ SOLN
INTRAMUSCULAR | Status: AC
  Filled 2022-01-11: qty 2

## 2022-01-11 SURGICAL SUPPLY — 77 items
BAG COUNTER SPONGE SURGICOUNT (BAG) ×1 IMPLANT
BLADE SURG 15 STRL LF DISP TIS (BLADE) ×2 IMPLANT
BLADE SURG 15 STRL SS (BLADE) ×1
BNDG ELASTIC 2X5.8 VLCR STR LF (GAUZE/BANDAGES/DRESSINGS) ×1 IMPLANT
BNDG ELASTIC 3X5.8 VLCR STR LF (GAUZE/BANDAGES/DRESSINGS) ×1 IMPLANT
BNDG ELASTIC 4X5.8 VLCR NS LF (GAUZE/BANDAGES/DRESSINGS) IMPLANT
BNDG ELASTIC 4X5.8 VLCR STR LF (GAUZE/BANDAGES/DRESSINGS) ×1 IMPLANT
BNDG ESMARK 4X9 LF (GAUZE/BANDAGES/DRESSINGS) ×1 IMPLANT
BNDG GAUZE DERMACEA FLUFF 4 (GAUZE/BANDAGES/DRESSINGS) ×3 IMPLANT
BNDG PLASTER X FAST 3X3 WHT LF (CAST SUPPLIES) ×1 IMPLANT
CANISTER WOUNDNEG PRESSURE 500 (CANNISTER) IMPLANT
CATH ROBINSON RED A/P 10FR (CATHETERS) IMPLANT
CHLORAPREP W/TINT 26 (MISCELLANEOUS) ×1 IMPLANT
CORD BIPOLAR FORCEPS 12FT (ELECTRODE) ×1 IMPLANT
COVER BACK TABLE 60X90IN (DRAPES) ×1 IMPLANT
COVER MAYO STAND STRL (DRAPES) ×1 IMPLANT
COVER SURGICAL LIGHT HANDLE (MISCELLANEOUS) ×1 IMPLANT
CUFF TOURN SGL QUICK 18X4 (TOURNIQUET CUFF) ×1 IMPLANT
CUFF TOURN SGL QUICK 24 (TOURNIQUET CUFF)
CUFF TRNQT CYL 24X4X16.5-23 (TOURNIQUET CUFF) IMPLANT
DRAIN PENROSE 0.25X18 (DRAIN) ×1 IMPLANT
DRAPE EXTREMITY T 121X128X90 (DISPOSABLE) ×1 IMPLANT
DRAPE OEC MINIVIEW 54X84 (DRAPES) IMPLANT
DRAPE SHEET LG 3/4 BI-LAMINATE (DRAPES) ×1 IMPLANT
DRAPE SURG 17X23 STRL (DRAPES) ×1 IMPLANT
DRSG ADAPTIC 3X8 NADH LF (GAUZE/BANDAGES/DRESSINGS) ×1 IMPLANT
DRSG EMULSION OIL 3X16 NADH (GAUZE/BANDAGES/DRESSINGS) IMPLANT
DRSG VAC ATS LRG SENSATRAC (GAUZE/BANDAGES/DRESSINGS) IMPLANT
DRSG VAC ATS MED SENSATRAC (GAUZE/BANDAGES/DRESSINGS) IMPLANT
GAUZE PAD ABD 8X10 STRL (GAUZE/BANDAGES/DRESSINGS) ×1 IMPLANT
GAUZE SPONGE 4X4 12PLY STRL (GAUZE/BANDAGES/DRESSINGS) ×1 IMPLANT
GAUZE STRETCH 2X75IN STRL (MISCELLANEOUS) IMPLANT
GAUZE XEROFORM 1X8 LF (GAUZE/BANDAGES/DRESSINGS) ×1 IMPLANT
GLOVE BIOGEL M 7.0 STRL (GLOVE) ×1 IMPLANT
GLOVE BIOGEL M 8.0 STRL (GLOVE) ×1 IMPLANT
GLOVE BIOGEL PI IND STRL 7.0 (GLOVE) ×1 IMPLANT
GLOVE SS BIOGEL STRL SZ 8 (GLOVE) ×1 IMPLANT
GOWN STRL REUS W/ TWL LRG LVL3 (GOWN DISPOSABLE) ×2 IMPLANT
GOWN STRL REUS W/ TWL XL LVL3 (GOWN DISPOSABLE) ×2 IMPLANT
GOWN STRL REUS W/TWL LRG LVL3 (GOWN DISPOSABLE) ×3
GOWN STRL REUS W/TWL XL LVL3 (GOWN DISPOSABLE) ×1
KIT BASIN OR (CUSTOM PROCEDURE TRAY) ×1 IMPLANT
KIT TURNOVER KIT A (KITS) ×1 IMPLANT
LOOP VESSEL MAXI BLUE (MISCELLANEOUS) ×1 IMPLANT
MANIFOLD NEPTUNE II (INSTRUMENTS) ×1 IMPLANT
NDL HYPO 25X1 1.5 SAFETY (NEEDLE) ×1 IMPLANT
NEEDLE ANCHOR KEITH 2 7/8 STR (NEEDLE) IMPLANT
NEEDLE HYPO 25X1 1.5 SAFETY (NEEDLE) IMPLANT
NS IRRIG 1000ML POUR BTL (IV SOLUTION) ×1 IMPLANT
PACK ORTHO EXTREMITY (CUSTOM PROCEDURE TRAY) ×1 IMPLANT
PAD ARMBOARD 7.5X6 YLW CONV (MISCELLANEOUS) ×1 IMPLANT
PAD CAST 4YDX4 CTTN HI CHSV (CAST SUPPLIES) ×2 IMPLANT
PADDING CAST ABS COTTON 3X4 (CAST SUPPLIES) ×1 IMPLANT
PADDING CAST COTTON 4X4 STRL (CAST SUPPLIES) ×2
SET IRRIG Y TYPE TUR BLADDER L (SET/KITS/TRAYS/PACK) ×1 IMPLANT
SOL PREP POV-IOD 4OZ 10% (MISCELLANEOUS) ×2 IMPLANT
SPIKE FLUID TRANSFER (MISCELLANEOUS) ×1 IMPLANT
SPONGE T-LAP 18X18 ~~LOC~~+RFID (SPONGE) IMPLANT
SPONGE T-LAP 4X18 ~~LOC~~+RFID (SPONGE) ×1 IMPLANT
STAPLER VISISTAT 35W (STAPLE) IMPLANT
SURGILUBE 2OZ TUBE FLIPTOP (MISCELLANEOUS) IMPLANT
SUT ETHILON 2 0 PS N (SUTURE) IMPLANT
SUT ETHILON 3 0 PS 1 (SUTURE) IMPLANT
SUT ETHILON 4 0 PS 2 18 (SUTURE) ×3 IMPLANT
SUT ETHILON 8 0 BV130 4 (SUTURE) IMPLANT
SUT FIBERWIRE 3-0 18 TAPR NDL (SUTURE)
SUT PROLENE 6 0 P 1 18 (SUTURE) IMPLANT
SUTURE FIBERWR 3-0 18 TAPR NDL (SUTURE) ×1 IMPLANT
SWAB CULTURE ESWAB REG 1ML (MISCELLANEOUS) IMPLANT
SYR BULB EAR ULCER 3OZ GRN STR (SYRINGE) ×1 IMPLANT
SYR CONTROL 10ML LL (SYRINGE) IMPLANT
TOWEL GREEN STERILE (TOWEL DISPOSABLE) IMPLANT
TOWEL OR NON WOVEN STRL DISP B (DISPOSABLE) ×2 IMPLANT
TUBING CONNECTING 10 (TUBING) ×1 IMPLANT
UNDERPAD 30X36 HEAVY ABSORB (UNDERPADS AND DIAPERS) ×1 IMPLANT
WATER STERILE IRR 1000ML POUR (IV SOLUTION) ×1 IMPLANT
YANKAUER SUCT BULB TIP NO VENT (SUCTIONS) ×1 IMPLANT

## 2022-01-11 NOTE — Transfer of Care (Signed)
Immediate Anesthesia Transfer of Care Note  Patient: Chris Parker  Procedure(s) Performed: Procedure(s): IRRIGATION AND DEBRIDEMENT RIGHT UPPER EXTREMITY (Right)  Patient Location: PACU  Anesthesia Type:General  Level of Consciousness: Alert, Awake, Oriented  Airway & Oxygen Therapy: Patient Spontanous Breathing  Post-op Assessment: Report given to RN  Post vital signs: Reviewed and stable  Last Vitals:  Vitals:   01/11/22 0600 01/11/22 0700  BP: (!) 158/94 (!) 154/91  Pulse: 100 90  Resp: 13 14  Temp:    SpO2: 61% (!) 47%    Complications: No apparent anesthesia complications

## 2022-01-11 NOTE — Brief Op Note (Signed)
01/11/2022  10:30 AM  PATIENT:  Chris Parker  31 y.o. male  PRE-OPERATIVE DIAGNOSIS:  COMPARTMENT SYNDROME RIGHT UPPER EXTREMITY  POST-OPERATIVE DIAGNOSIS:  COMPARTMENT SYNDROME RIGHT UPPER EXTREMITY  PROCEDURE:  Procedure(s): IRRIGATION AND DEBRIDEMENT RIGHT UPPER EXTREMITY (Right)  SURGEON:  Surgeon(s) and Role:    * Sherilyn Cooter, MD - Primary  PHYSICIAN ASSISTANT:   ASSISTANTS: none   ANESTHESIA:   general  EBL:  15 mL   BLOOD ADMINISTERED:none  DRAINS:  [place] volar forearm    LOCAL MEDICATIONS USED:  NONE  SPECIMEN:  No Specimen  DISPOSITION OF SPECIMEN:  N/A  COUNTS:  YES  TOURNIQUET:   Total Tourniquet Time Documented: Forearm (Right) - 11 minutes Total: Forearm (Right) - 11 minutes   DICTATION: .Dragon Dictation  PLAN OF CARE: Return to floor  PATIENT DISPOSITION:  PACU - hemodynamically stable.   Delay start of Pharmacological VTE agent (>24hrs) due to surgical blood loss or risk of bleeding: not applicable   WVAC applied to RUE Reinforce dressings PRN Will plan for repeat debridement on Wednesday  Sherilyn Cooter, M.D. [facility]

## 2022-01-11 NOTE — Interval H&P Note (Signed)
History and Physical Interval Note:  01/11/2022 8:07 AM  Chris Parker  has presented today for surgery, with the diagnosis of COMPARTMENT SYNDROME RIGHT UPPER EXTREMITY.  The various methods of treatment have been discussed with the patient and family. After consideration of risks, benefits and other options for treatment, the patient has consented to  Procedure(s): IRRIGATION AND DEBRIDEMENT RIGHT UPPER EXTREMITY (Right) as a surgical intervention.  The patient's history has been reviewed, patient examined, no change in status since recent forearm fasciotomy, stable for surgery.  I have reviewed the patient's chart and labs.  Questions were answered to the patient's satisfaction.     Sherilyn Cooter, MD

## 2022-01-11 NOTE — Progress Notes (Signed)
PROGRESS NOTE Chris Parker  [id number] DOB: 07/31/1990 DOA: 01/08/2022 PCP: Patient, No Pcp Per   Brief Narrative/Hospital Course: 31-yom w/ history of IVDA, found down on the ground on his right side for unclear duration of time,reportedly with needle sticking out of right arm. In ED- in severe rhabdomyolysis w/ compartment syndrome  Rt UE  s/p OR by Dr. Tempie Donning- emergent fasciotomy RUE, placed on aggressive IVF,has SIRS criteria,but without overt underlying infectious source, cxr-right-sided airspace opacities ?asymmetric edema given that the patient was found on his right sidem procal and CTA ordere  placed onemperic broad-spectrum IV antibiotics with IV Vanco and cefepime. Also with hyperkalemia, s/p IV insulin and Lokelma, AKI creat 1.8, metabolci acidosis, transaminitis, w/ elevated troponin discussed w/ Dr. Debara Pickett, who felt that this elevation was less likely represent acute MI, but more likely on the basis of type II supply/demand mismatch- no heparin added but troponin trended and echocardiogram. ABG w/ 7.29/41/158/21. CT abd.pelvis, CT T L spine and CT Head done done in ED SHOWED: Inflammatory fat stranding along the anterior and lateral aspect of the lower right chest wall, consistent with the patient's history of recent trauma. Mild patchy right middle lobe and anterior right lower lobe airspace disease, which may represent ill-defined areas of pulmonary contusion. An underlying infectious versus inflammatory process cannot be excluded. Patchy opacities on rt lung-consistent w/ pneumonia No osseus findings on L/T spine  9/23: Due to ongoing pain underwent right dorsal forearm compartment release. Placed Foley 9/25: Irrigation and debridement of right upper extremity  Subjective: Seen and examined this morning.  He is back from work.  Some soreness in the right upper extremity, able to move right upper extremity now.  Able to wiggle and feel on right lower extremity Underwent irrigation  debridement of right upper extremity this morning  Overnight Tmax 99.6 Labs shows potassium 3.3 creatinine further up 2.2, AST ALT downtrending, CK bumped up to 19 K,wbc bumped up 21k-good urine output 4.5 L   Assessment and Plan: Principal Problem:   Rhabdomyolysis Active Problems:   Compartment syndrome (HCC)   SIRS (systemic inflammatory response syndrome) (HCC)   Elevated troponin   Hyperkalemia   AKI (acute kidney injury) (HCC)   Lactic acidosis   Transaminitis   IV drug abuse (HCC)   Severe Rhabdomyolysis secondary to Prolonged laying on right side Compartment syndrome RUE 2/2 laying on rt side/drug use: Status post emergent fasciectomy of forearm and hand 9/22 and 9/23 (Dr. Tempie Donning).  Continue dressing changes, continue pain control.  Orthopedics following, CK is nicely resolving, having urine output, resume ivf. Await nephro to see today-informed.  Acute kidney injury: Likely in the setting of rhabdomyolysis ,no prior labs.  Creatinine was holding around 1.8 overnight bump further 2.2 CK also worsened.  Notified nephrology monitor intake output, Foley in place continue . Resume IVF, hold lasix until seen by nephro Recent Labs  Lab 01/09/22 0058 01/09/22 0654 01/09/22 2138 01/10/22 0313 01/11/22 0248  BUN 42* 47* 44* 40* 48*  CREATININE 1.60* 1.80* 1.88* 1.85* 2.29*  Hypocalcemia in the setting of hypoalbuminemia, calcium 7.3 corrected >8. Hyponatremia: Sodium mildly low.  Continue IV fluids Metabolic acidosis w/ lactic acidosis: Lactic acid resolved.Bicarb improved to 23.   Hyperkalemia: Resolved  Hypokalemia replete.     RUE/RLE weakness w/ decreased sensation  Prolonged laying on the right side: CT head no acute findings CT L and T-spine no acute finding.  Most likely weakness fromrhabdomyolysis from laying on the right side,+/-drug use/unresponsive on floor discussed  with neurology Dr Pat Patrick advised MRI of the brain-patient not able to tolerate MRI due to spasm  (likely low yield) he seems to be having some movement in the right lower extremity now.   Right sided pneumonia likely aspiration pneumonia/Sepsis POA: Pro-Cal elevated 49, wbc In 26k-imaging w/ rt sided pneumonia.  Blood cultures no growth so far.  Leukocytosis slowly downtrending but further up.  Continue zosyn, off vancomycin to minimize nephrotoxicity  Recent Labs  Lab 01/08/22 1739 01/08/22 1909 01/09/22 0038 01/09/22 0058 01/10/22 0313 01/11/22 0248  WBC 26.2*  --  19.8*  --  16.5* 21.3*  LATICACIDVEN 4.1* 2.9*  --  1.3  --   --   PROCALCITON  --   --   --  49.43  --   --     Transaminitis w/ very high AST/ALT/ALP, w/ normal TB: Suspect from rhabdomyolysis/HCV.  LFTs downtrending continue to monitor Hepatitis C- new:Hep C antibody positive checking hep C viral load Recent Labs  Lab 01/08/22 1739 01/09/22 0058 01/10/22 0313 01/11/22 0248  AST 1,191* 1,085* 728* 498*  ALT 318* 293* 275* 253*  ALKPHOS 153* 97 82 94  BILITOT 0.7 0.7 0.7 0.8  PROT 7.8 5.4* 5.3* 5.8*  ALBUMIN 3.4* 2.4* 2.3* 2.4*  INR 1.2 1.5*  --   --    Elevated troponin due to demand ischemia from rhabdomyolysis/compartment syndrome, trops 3016>0109>3235> 1200: Dr. Debara Pickett was discussed-no further recommendation, echo with EF 60 to 65%, no RWMA  IVDA admits using fentanyl, urine drug screen negative. Fu blood culture negative, hepatitis C antibody positive HIV negative. IDS negative.  High blood pressure no prior history of hypertension likely elevated due to pain.  Continue to address underlying pain. prn antihypertensives if needed  I discussed specifically not to leave [place], as it may lead to incomplete treatment causing significant disability and even death, patient verbalized, nursing at the bedside.  DVT prophylaxis: SCDs Start: 01/08/22 2020 holding Lovenox. Code Status:   Code Status: Full Code Family Communication: plan of care discussed with patient at bedside.  Patient status  is: Inpatient because of severe rhabdomyolysis, compartment syndrome Level of care: Stepdown > monitor closely at risk of decompensation.  Dispo: The patient is from: home            Anticipated disposition: TBD  Mobility Assessment (last 72 hours)     Mobility Assessment   No documentation.         Objective: Vitals last 24 hrs: Vitals:   01/11/22 1115 01/11/22 1130 01/11/22 1200 01/11/22 1204  BP: 139/84 (!) 138/90 (!) 147/86   Pulse: 73 75 75 75  Resp: 12 14 11 12   Temp:      TempSrc:      SpO2: 97% 97% 100% 100%  Weight:      Height:       Weight change:   Physical Examination: General exam: AA, older than stated age, weak appearing. HEENT:Oral mucosa moist, Ear/Nose WNL grossly, dentition normal. Respiratory system: bilaterally diminished, no use of accessory muscle Cardiovascular system: S1 & S2 +, No JVD,. Gastrointestinal system: Abdomen soft,NT,ND,BS+ Nervous System:Alert, awake, moving extremities on let well, rt toes abel to wiggle some, moving RUE Extremities: RUE with dressing in place, distal peripheral pulses palpable.  Skin: No rashes,no icterus. MSK: Normal muscle bulk,tone, power   Medications reviewed:  Scheduled Meds:  calcium carbonate  1 tablet Oral TID   Chlorhexidine Gluconate Cloth  6 each Topical Daily   furosemide  60  mg Intravenous BID   Continuous Infusions:  sodium chloride 150 mL/hr at 01/11/22 1228   sodium chloride 10 mL/hr at 01/11/22 1230   ceFEPime (MAXIPIME) IV 2 g (01/11/22 1231)   dexmedetomidine (PRECEDEX) IV infusion Stopped (01/08/22 2322)      Diet Order             Diet regular Room service appropriate? Yes; Fluid consistency: Thin  Diet effective now                  Intake/Output Summary (Last 24 hours) at 01/11/2022 1317 Last data filed at 01/11/2022 1133 Gross per 24 hour  Intake 1010.56 ml  Output 3966 ml  Net -2955.44 ml   Net IO Since Admission: -7,412.05 mL [01/11/22 1317]  Wt Readings from  Last 3 Encounters:  01/10/22 79 kg   Unresulted Labs (From admission, onward)     Start     Ordered   01/10/22 1215  HCV RNA quant  Once,   R       Question:  Specimen collection method  Answer:  Unit=Unit collect   01/10/22 1214   01/10/22 0500  Comprehensive metabolic panel  Daily at 5am,   R     Question:  Specimen collection method  Answer:  Unit=Unit collect   01/09/22 0808   01/10/22 0500  CBC  Daily at 5am,   R     Question:  Specimen collection method  Answer:  Unit=Unit collect   01/09/22 0808   01/10/22 0500  CK  Daily at 5am,   R     Question:  Specimen collection method  Answer:  Unit=Unit collect   01/09/22 1011   01/09/22 0500  CBC with Differential/Platelet  Tomorrow morning,   R        01/08/22 2021          Data Reviewed: I have personally reviewed following labs and imaging studies CBC: Recent Labs  Lab 01/08/22 1739 01/08/22 2208 01/09/22 0038 01/10/22 0313 01/11/22 0248  WBC 26.2*  --  19.8* 16.5* 21.3*  NEUTROABS 23.6*  --  17.6*  --   --   HGB 19.0* 14.6 15.1 11.3* 13.2  HCT 54.9* 43.0 44.7 33.0* 37.3*  MCV 89.4  --  90.3 91.2 88.0  PLT 290  --  204 166 [id number]   Basic Metabolic Panel: Recent Labs  Lab 01/08/22 1739 01/08/22 2208 01/09/22 0058 01/09/22 0654 01/09/22 2138 01/10/22 0313 01/10/22 1557 01/11/22 0248  NA 134*   < > 132* 131* 130* 134*  --  131*  K 6.1*   < > 5.7* 4.9 4.6 4.1 3.6 3.3*  CL 106  --  109 106 104 107  --  99  CO2 16*  --  18* 19* 19* 20*  --  23  GLUCOSE 147*  --  150* 132* 199* 177*  --  178*  BUN 42*  --  42* 47* 44* 40*  --  48*  CREATININE 1.83*  --  1.60* 1.80* 1.88* 1.85*  --  2.29*  CALCIUM 7.9*  --  6.6* 7.0* 6.6* 5.9*  --  7.3*  MG 2.0  --  2.1  --   --  1.7  --   --   PHOS  --   --  2.7  --   --  3.5  --   --    < > = values in this interval not displayed.   GFR: Estimated Creatinine Clearance: 48 mL/min (A) (by  C-G formula based on SCr of 2.29 mg/dL (H)). Liver Function Tests: Recent Labs  Lab  01/08/22 1739 01/09/22 0058 01/10/22 0313 01/11/22 0248  AST 1,191* 1,085* 728* 498*  ALT 318* 293* 275* 253*  ALKPHOS 153* 97 82 94  BILITOT 0.7 0.7 0.7 0.8  PROT 7.8 5.4* 5.3* 5.8*  ALBUMIN 3.4* 2.4* 2.3* 2.4*   Recent Labs  Lab 01/08/22 1739  LIPASE 35   No results for input(s): "AMMONIA" in the last 168 hours. Coagulation Profile: Recent Labs  Lab 01/08/22 1739 01/09/22 0058  INR 1.2 1.5*   BNP (last 3 results) No results for input(s): "PROBNP" in the last 8760 hours. HbA1C: No results for input(s): "HGBA1C" in the last 72 hours. CBG: Recent Labs  Lab 01/08/22 1959 01/08/22 2347  GLUCAP 119* 144*   Lipid Profile: No results for input(s): "CHOL", "HDL", "LDLCALC", "TRIG", "CHOLHDL", "LDLDIRECT" in the last 72 hours. Thyroid Function Tests: No results for input(s): "TSH", "T4TOTAL", "FREET4", "T3FREE", "THYROIDAB" in the last 72 hours. Sepsis Labs: Recent Labs  Lab 01/08/22 1739 01/08/22 1909 01/09/22 0058  PROCALCITON  --   --  49.43  LATICACIDVEN 4.1* 2.9* 1.3    Recent Results (from the past 240 hour(s))  Urine Culture     Status: None   Collection Time: 01/08/22  5:09 PM   Specimen: In/Out Cath Urine  Result Value Ref Range Status   Specimen Description   Final    IN/OUT CATH URINE Performed at [facility], [place] [address] [place], [place] [address]    Special Requests   Final    NONE Performed at [facility], [place] [address] [place], [place] [address]    Culture   Final    NO GROWTH Performed at [facility], [place] [address], [place], [place] [address]    Report Status 01/10/2022 FINAL  Final  Blood Culture (routine x 2)     Status: None (Preliminary result)   Collection Time: 01/08/22  5:39 PM   Specimen: BLOOD  Result Value Ref Range Status   Specimen Description   Final    BLOOD RIGHT ANTECUBITAL Performed at [facility] [address], [place], [place] [address]     Special Requests   Final    BOTTLES DRAWN AEROBIC AND ANAEROBIC Blood Culture adequate volume Performed at [facility] [address], [place], [place] [address]    Culture   Final    NO GROWTH 2 DAYS Performed at [facility] [address], [place],  [address]    Report Status PENDING  Incomplete  Resp Panel by RT-PCR (Flu A&B, Covid) Anterior Nasal Swab     Status: None   Collection Time: 01/08/22  5:42 PM   Specimen: Anterior Nasal Swab  Result Value Ref Range Status   SARS Coronavirus 2 by RT PCR NEGATIVE NEGATIVE Final    Comment: (NOTE) SARS-CoV-2 target nucleic acids are NOT DETECTED.  The SARS-CoV-2 RNA is generally detectable in upper respiratory specimens during the acute phase of infection. The lowest concentration of SARS-CoV-2 viral copies this assay can detect is 138 copies/mL. A negative result does not preclude SARS-Cov-2 infection and should not be used as the sole basis for treatment or other patient management decisions. A negative result may occur with  improper specimen collection/handling, submission of specimen other than nasopharyngeal swab, presence of viral mutation(s) within the areas targeted by this assay, and inadequate number of viral copies(<138  copies/mL). A negative result must be combined with clinical observations, patient history, and epidemiological information. The expected result is Negative.  Fact Sheet for Patients:  EntrepreneurPulse.com.au  Fact Sheet for Healthcare Providers:  IncredibleEmployment.be  This test is no t yet approved or cleared by the [place] FDA and  has been authorized for detection and/or diagnosis of SARS-CoV-2 by FDA under an Emergency Use Authorization (EUA). This EUA will remain  in effect (meaning this test can be used) for the duration of the COVID-19 declaration under Section 564(b)(1) of the Act, 21 U.S.C.section 360bbb-3(b)(1),  unless the authorization is terminated  or revoked sooner.       Influenza A by PCR NEGATIVE NEGATIVE Final   Influenza B by PCR NEGATIVE NEGATIVE Final    Comment: (NOTE) The Xpert Xpress SARS-CoV-2/FLU/RSV plus assay is intended as an aid in the diagnosis of influenza from Nasopharyngeal swab specimens and should not be used as a sole basis for treatment. Nasal washings and aspirates are unacceptable for Xpert Xpress SARS-CoV-2/FLU/RSV testing.  Fact Sheet for Patients: EntrepreneurPulse.com.au  Fact Sheet for Healthcare Providers: IncredibleEmployment.be  This test is not yet approved or cleared by the [place] FDA and has been authorized for detection and/or diagnosis of SARS-CoV-2 by FDA under an Emergency Use Authorization (EUA). This EUA will remain in effect (meaning this test can be used) for the duration of the COVID-19 declaration under Section 564(b)(1) of the Act, 21 U.S.C. section 360bbb-3(b)(1), unless the authorization is terminated or revoked.  Performed at [facility], [place] [address], [place], [place] [address]   Blood Culture (routine x 2)     Status: None (Preliminary result)   Collection Time: 01/08/22  6:00 PM   Specimen: BLOOD  Result Value Ref Range Status   Specimen Description   Final    BLOOD LEFT ANTECUBITAL Performed at [facility] [address], [place], [place] [address]    Special Requests   Final    BOTTLES DRAWN AEROBIC AND ANAEROBIC Blood Culture adequate volume Performed at [facility] [address] [place], [place] [address]    Culture   Final    NO GROWTH 2 DAYS Performed at [facility] [address] [place], [place] [address]    Report Status PENDING  Incomplete  MRSA Next Gen by PCR, Nasal     Status: None   Collection Time: 01/09/22  2:53 AM   Specimen: Nasal Mucosa; Nasal Swab  Result Value Ref Range Status    MRSA by PCR Next Gen NOT DETECTED NOT DETECTED Final    Comment: (NOTE) The GeneXpert MRSA Assay (FDA approved for NASAL specimens only), is one component of a comprehensive MRSA colonization surveillance program. It is not intended to diagnose MRSA infection nor to guide or monitor treatment for MRSA infections. Test performance is not FDA approved in patients less than 72 years old. Performed at [facility], [place] [address] [place], [place] [address]     Antimicrobials: Anti-infectives (From admission, onward)    Start     Dose/Rate Route Frequency Ordered Stop   01/09/22 1600  vancomycin (VANCOREADY) IVPB 1500 mg/300 mL  Status:  Discontinued        1,500 mg 150 mL/hr over 120 Minutes Intravenous Every 24 hours 01/09/22 0043 01/10/22 1304   01/09/22 0200  ceFEPIme (MAXIPIME) 2 g in sodium chloride 0.9 % 100 mL IVPB        2 g 200  mL/hr over 30 Minutes Intravenous Every 8 hours 01/09/22 0043     01/08/22 1715  ceFEPIme (MAXIPIME) 2 g in sodium chloride 0.9 % 100 mL IVPB        2 g 200 mL/hr over 30 Minutes Intravenous  Once 01/08/22 1713 01/08/22 1819   01/08/22 1715  metroNIDAZOLE (FLAGYL) IVPB 500 mg        500 mg 100 mL/hr over 60 Minutes Intravenous  Once 01/08/22 1713 01/08/22 1833   01/08/22 1715  vancomycin (VANCOCIN) IVPB 1000 mg/200 mL premix        1,000 mg 200 mL/hr over 60 Minutes Intravenous  Once 01/08/22 1713 01/08/22 2019      Culture/Microbiology    Component Value Date/Time   SDES  01/08/2022 1800    BLOOD LEFT ANTECUBITAL Performed at [facility], [place] [address] [place], [place] [address]    SPECREQUEST  01/08/2022 1800    BOTTLES DRAWN AEROBIC AND ANAEROBIC Blood Culture adequate volume Performed at [facility] [address] [place], [place] [address]    CULT  01/08/2022 1800    NO GROWTH 2 DAYS Performed at [facility] [address], [place], [place] [address]    REPTSTATUS  PENDING 01/08/2022 1800    Other culture-see note  Radiology Studies: DG CHEST PORT 1 VIEW  Result Date: 01/09/2022 CLINICAL DATA:  Pneumonia EXAM: PORTABLE CHEST 1 VIEW COMPARISON:  01/08/2022 FINDINGS: There is partial clearing of infiltrate in right lung. There is residual alveolar infiltrate in right upper lung fields. Left lung is clear. There is no pleural effusion or pneumothorax. IMPRESSION: There is partial clearing of infiltrates in right lung suggesting resolving pneumonia. Residual alveolar infiltrates are noted in right upper lung field. There is no pleural effusion. Electronically Signed   By: Elmer Picker M.D.   On: 01/09/2022 15:28     LOS: 3 days   Antonieta Pert, MD [facility]  01/11/2022, 1:17 PM

## 2022-01-11 NOTE — Progress Notes (Signed)
Pharmacy Antibiotic Note  Chris Parker is a 31 y.o. male  with hx IVDU admitted who presented to the ED on 01/08/2022 with AMS and right arm swelling. He was found to have rhabo along with RUE compartment syndrome and underwent fasciectomy on 9/22.  He's currently on cefepime for PNA.  Today, 01/11/2022: - day #3 abx - wbc elevated 21.3 - scr up 2.29 (crcl~48), on NaCL @150  ml/hr and lasix 60 mg IV q12h - all cultures have been negative thus far  Plan: - adjust cefepime dose to 2gm q12h for renal function. - watch renal function closely ______________________________________  Height: 5\' 9"  (175.3 cm) Weight: 79 kg (174 lb 2.6 oz) IBW/kg (Calculated) : 70.7  Temp (24hrs), Avg:98.5 F (36.9 C), Min:97.9 F (36.6 C), Max:99.6 F (37.6 C)  Recent Labs  Lab 01/08/22 1739 01/08/22 1909 01/09/22 0038 01/09/22 0058 01/09/22 0654 01/09/22 2138 01/10/22 0313 01/11/22 0248  WBC 26.2*  --  19.8*  --   --   --  16.5* 21.3*  CREATININE 1.83*  --   --  1.60* 1.80* 1.88* 1.85* 2.29*  LATICACIDVEN 4.1* 2.9*  --  1.3  --   --   --   --     Estimated Creatinine Clearance: 48 mL/min (A) (by C-G formula based on SCr of 2.29 mg/dL (H)).    No Known Allergies   Thank you for allowing pharmacy to be a part of this patient's care.  Lynelle Doctor 01/11/2022 1:28 PM

## 2022-01-11 NOTE — Progress Notes (Signed)
Patient transferred to Short Stay at approximately 0700. This RN unable to complete morning shift assessment due to patient not being available at this time. RN will do full shift assessment when patient returns to room.

## 2022-01-11 NOTE — Anesthesia Procedure Notes (Signed)
Procedure Name: LMA Insertion Date/Time: 01/11/2022 8:34 AM  Performed by: Gerald Leitz, CRNAPre-anesthesia Checklist: Patient identified, Patient being monitored, Timeout performed, Emergency Drugs available and Suction available Patient Re-evaluated:Patient Re-evaluated prior to induction Oxygen Delivery Method: Circle system utilized Preoxygenation: Pre-oxygenation with 100% oxygen Induction Type: IV induction Ventilation: Mask ventilation without difficulty LMA: LMA inserted LMA Size: 4.0 Tube type: Oral Number of attempts: 1 Placement Confirmation: positive ETCO2 and breath sounds checked- equal and bilateral Tube secured with: Tape Dental Injury: Teeth and Oropharynx as per pre-operative assessment

## 2022-01-11 NOTE — Progress Notes (Signed)
[facility] Progress Note  Subjective: Robust response to IV Lasix.  Went to OR with ortho today.    Vitals:   01/11/22 1115 01/11/22 1130 01/11/22 1200 01/11/22 1204  BP: 139/84 (!) 138/90 (!) 147/86   Pulse: 73 75 75 75  Resp: 12 14 11 12   Temp:   98.5 F (36.9 C)   TempSrc:   Oral   SpO2: 97% 97% 100% 100%  Weight:      Height:        Exam: Gen alert, no distress,   No jvd or bruits Chest clear bilat to bases, no rales/ wheezing RRR no MRG Abd soft ntnd no mass or ascites +bs Ext R arm wrapped, in sterile OR dressing Neuro is alert, Ox 3 , nf     Assessment/ Plan: AKI - no old labs. Creat here 1.8 on admission in setting of acute rhabdomyolysis related to drug OD and compression muscle injury. CT abd shows normal kidneys w/o obstruction. UA normal, urine lytes c/w ATN. Pt was admitted and rec'd 4.5 L bolus in ED + LR at 150 cc/hr. He went to OR then for compartment syndrome.   - got aggressive IVFs and got a little volume overloaded  - now no LE edema, not on O2 and appears euvolemic  - CK up a little again- back on IVFs, end time is tomorrow at 0800 Acute rhabdomyolysis - due to drug OD and compression injury. CPK > 50,000 down to 1400--> up to 19,000 and now is s/p OR with ortho Hyperkalemia - resolved w/ lokelma + resolution of acute rhabdo episode, will dc lokelma.  Compartment syndrome - per orthopedic surgeons    Madelon Lips MD 01/11/2022, 1:46 PM   Recent Labs  Lab 01/09/22 0058 01/09/22 0654 01/10/22 0313 01/10/22 1557 01/11/22 0248  HGB  --   --  11.3*  --  13.2  ALBUMIN 2.4*  --  2.3*  --  2.4*  CALCIUM 6.6*   < > 5.9*  --  7.3*  PHOS 2.7  --  3.5  --   --   CREATININE 1.60*   < > 1.85*  --  2.29*  K 5.7*   < > 4.1 3.6 3.3*   < > = values in this interval not displayed.   No results for input(s): "IRON", "TIBC", "FERRITIN" in the last 168 hours. Inpatient medications:  calcium carbonate  1 tablet Oral TID   Chlorhexidine  Gluconate Cloth  6 each Topical Daily   furosemide  60 mg Intravenous BID    sodium chloride 150 mL/hr at 01/11/22 1327   sodium chloride 10 mL/hr at 01/11/22 1327   ceFEPime (MAXIPIME) IV     dexmedetomidine (PRECEDEX) IV infusion Stopped (01/08/22 2322)   sodium chloride, acetaminophen **OR** acetaminophen, hydrALAZINE, HYDROmorphone (DILAUDID) injection, LORazepam, naLOXone (NARCAN)  injection, ondansetron (ZOFRAN) IV, mouth rinse, oxyCODONE

## 2022-01-11 NOTE — Anesthesia Preprocedure Evaluation (Signed)
Anesthesia Evaluation  Patient identified by MRN, date of birth, ID band Patient awake    Reviewed: Allergy & Precautions, NPO status , Patient's Chart, lab work & pertinent test results  Airway Mallampati: III  TM Distance: >3 FB Neck ROM: Full    Dental  (+) Dental Advisory Given, Poor Dentition   Pulmonary Current Smoker and Patient abstained from smoking.,    Pulmonary exam normal        Cardiovascular negative cardio ROS Normal cardiovascular exam     Neuro/Psych negative neurological ROS  negative psych ROS   GI/Hepatic negative GI ROS, (+)     substance abuse  alcohol use and IV drug use,  AST 1085 ALT 293    Endo/Other   Na 131 K 4.9 Ca 7   Renal/GU Renal InsufficiencyRenal disease     Musculoskeletal negative musculoskeletal ROS (+) narcotic dependent  Abdominal   Peds  Hematology Hb 19   Anesthesia Other Findings   Reproductive/Obstetrics                             Anesthesia Physical  Anesthesia Plan  ASA: 3  Anesthesia Plan: General   Post-op Pain Management: Minimal or no pain anticipated   Induction: Intravenous  PONV Risk Score and Plan: 1 and Ondansetron and Treatment may vary due to age or medical condition  Airway Management Planned: LMA  Additional Equipment: None  Intra-op Plan:   Post-operative Plan: Extubation in OR  Informed Consent: I have reviewed the patients History and Physical, chart, labs and discussed the procedure including the risks, benefits and alternatives for the proposed anesthesia with the patient or authorized representative who has indicated his/her understanding and acceptance.     Dental advisory given  Plan Discussed with: CRNA, Anesthesiologist and Surgeon  Anesthesia Plan Comments:         Anesthesia Quick Evaluation

## 2022-01-11 NOTE — Anesthesia Postprocedure Evaluation (Signed)
Anesthesia Post Note  Patient: Josemiguel Gries  Procedure(s) Performed: IRRIGATION AND DEBRIDEMENT RIGHT UPPER EXTREMITY (Right)     Patient location during evaluation: PACU Anesthesia Type: General Level of consciousness: awake and alert Pain management: pain level controlled Vital Signs Assessment: post-procedure vital signs reviewed and stable Respiratory status: spontaneous breathing, nonlabored ventilation and respiratory function stable Cardiovascular status: blood pressure returned to baseline and stable Postop Assessment: no apparent nausea or vomiting Anesthetic complications: no   No notable events documented.  Last Vitals:  Vitals:   01/11/22 1200 01/11/22 1204  BP: (!) 147/86   Pulse: 75 75  Resp: 11 12  Temp:    SpO2: 100% 100%    Last Pain:  Vitals:   01/11/22 1200  TempSrc:   PainSc: 10-Worst pain ever                 Lynda Rainwater

## 2022-01-12 ENCOUNTER — Encounter (HOSPITAL_COMMUNITY): Payer: Self-pay | Admitting: Orthopedic Surgery

## 2022-01-12 DIAGNOSIS — M6282 Rhabdomyolysis: Secondary | ICD-10-CM | POA: Diagnosis not present

## 2022-01-12 DIAGNOSIS — S41101A Unspecified open wound of right upper arm, initial encounter: Secondary | ICD-10-CM

## 2022-01-12 LAB — COMPREHENSIVE METABOLIC PANEL
ALT: 194 U/L — ABNORMAL HIGH (ref 0–44)
AST: 331 U/L — ABNORMAL HIGH (ref 15–41)
Albumin: 2 g/dL — ABNORMAL LOW (ref 3.5–5.0)
Alkaline Phosphatase: 76 U/L (ref 38–126)
Anion gap: 7 (ref 5–15)
BUN: 43 mg/dL — ABNORMAL HIGH (ref 6–20)
CO2: 23 mmol/L (ref 22–32)
Calcium: 7 mg/dL — ABNORMAL LOW (ref 8.9–10.3)
Chloride: 103 mmol/L (ref 98–111)
Creatinine, Ser: 2.08 mg/dL — ABNORMAL HIGH (ref 0.61–1.24)
GFR, Estimated: 44 mL/min — ABNORMAL LOW (ref >60)
Glucose, Bld: 176 mg/dL — ABNORMAL HIGH (ref 70–99)
Potassium: 3.7 mmol/L (ref 3.5–5.1)
Sodium: 133 mmol/L — ABNORMAL LOW (ref 135–145)
Total Bilirubin: 0.6 mg/dL (ref 0.3–1.2)
Total Protein: 4.9 g/dL — ABNORMAL LOW (ref 6.5–8.1)

## 2022-01-12 LAB — CK: Total CK: 13646 U/L — ABNORMAL HIGH (ref 49–397)

## 2022-01-12 LAB — CBC
HCT: 31.8 % — ABNORMAL LOW (ref 39.0–52.0)
Hemoglobin: 11.1 g/dL — ABNORMAL LOW (ref 13.0–17.0)
MCH: 31 pg (ref 26.0–34.0)
MCHC: 34.9 g/dL (ref 30.0–36.0)
MCV: 88.8 fL (ref 80.0–100.0)
Platelets: 174 10*3/uL (ref 150–400)
RBC: 3.58 MIL/uL — ABNORMAL LOW (ref 4.22–5.81)
RDW: 12.9 % (ref 11.5–15.5)
WBC: 20.3 10*3/uL — ABNORMAL HIGH (ref 4.0–10.5)
nRBC: 0 % (ref 0.0–0.2)

## 2022-01-12 NOTE — Progress Notes (Signed)
[facility] Progress Note  Subjective: CK down, off fluids and Lasix.  Awake and alert, feeling well  Vitals:   01/12/22 0500 01/12/22 0600 01/12/22 0700 01/12/22 0800  BP: 136/64 133/74 137/67 137/83  Pulse: 66 62 68 79  Resp: 15 15 15 14   Temp:    98.7 F (37.1 C)  TempSrc:    Oral  SpO2: 97% 100% 97% 100%  Weight:      Height:        Exam: Gen alert, no distress No jvd or bruits Chest clear bilat to bases, no rales/ wheezing RRR no MRG Abd soft ntnd no mass or ascites +bs Ext R arm wrapped, in sterile OR dressing Neuro is alert, Ox 3 , nf     Assessment/ Plan: AKI - no old labs. Creat here 1.8 on admission in setting of acute rhabdomyolysis related to drug OD and compression muscle injury. CT abd shows normal kidneys w/o obstruction. UA normal, urine lytes c/w ATN. Pt was admitted and rec'd 4.5 L bolus in ED + LR at 150 cc/hr. He went to OR then for compartment syndrome.   - got aggressive IVFs and got a little volume overloaded  - now no LE edema, not on O2 and appears euvolemic  - CK downtrending after the OR and Cr improving, will see how he does off fluids Acute rhabdomyolysis - due to drug OD and compression injury. CPK > 50,000 down to 1400--> up to 19,000 and now is s/p OR with ortho--> 13,000 Hyperkalemia - resolved w/ lokelma + resolution of acute rhabdo episode, now off Compartment syndrome - per orthopedic surgeons    Madelon Lips MD 01/12/2022, 1:22 PM   Recent Labs  Lab 01/09/22 0058 01/09/22 0654 01/10/22 0313 01/10/22 1557 01/11/22 0248 01/12/22 0254  HGB  --   --  11.3*  --  13.2 11.1*  ALBUMIN 2.4*  --  2.3*  --  2.4* 2.0*  CALCIUM 6.6*   < > 5.9*  --  7.3* 7.0*  PHOS 2.7  --  3.5  --   --   --   CREATININE 1.60*   < > 1.85*  --  2.29* 2.08*  K 5.7*   < > 4.1   < > 3.3* 3.7   < > = values in this interval not displayed.   No results for input(s): "IRON", "TIBC", "FERRITIN" in the last 168 hours. Inpatient medications:   calcium carbonate  1 tablet Oral TID   Chlorhexidine Gluconate Cloth  6 each Topical Daily   furosemide  60 mg Intravenous BID    sodium chloride 10 mL/hr at 01/12/22 1118   ceFEPime (MAXIPIME) IV Stopped (01/12/22 1100)   dexmedetomidine (PRECEDEX) IV infusion Stopped (01/08/22 2322)   sodium chloride, acetaminophen **OR** acetaminophen, hydrALAZINE, HYDROmorphone (DILAUDID) injection, LORazepam, naLOXone (NARCAN)  injection, ondansetron (ZOFRAN) IV, mouth rinse, oxyCODONE

## 2022-01-12 NOTE — Progress Notes (Signed)
PROGRESS NOTE Leta BaptistGlenn Lineman  WUJ:[id number] DOB: 05/07/1990 DOA: 01/09/2028 PCP: Patient, No Pcp Per   Brief Narrative/Hospital Course: 31-yom w/ history of IVDA, found down on the ground on his right side for unclear duration of time,reportedly with needle sticking out of right arm. In ED- in severe rhabdomyolysis w/ compartment syndrome  Rt UE  s/p OR by Dr. Frazier ButtBenfield- emergent fasciotomy RUE, placed on aggressive IVF,has SIRS criteria,but without overt underlying infectious source, cxr-right-sided airspace opacities ?asymmetric edema given that the patient was found on his right sidem procal and CTA ordere  placed onemperic broad-spectrum IV antibiotics with IV Vanco and cefepime. Also with hyperkalemia, s/p IV insulin and Lokelma, AKI creat 1.8, metabolci acidosis, transaminitis, w/ elevated troponin discussed w/ Dr. Rennis GoldenHilty, who felt that this elevation was less likely represent acute MI, but more likely on the basis of type II supply/demand mismatch- no heparin added but troponin trended and echocardiogram. ABG w/ 7.29/41/158/21. CT abd.pelvis, CT T L spine and CT Head done done in ED SHOWED: Inflammatory fat stranding along the anterior and lateral aspect of the lower right chest wall, consistent with the patient's history of recent trauma. Mild patchy right middle lobe and anterior right lower lobe airspace disease, which may represent ill-defined areas of pulmonary contusion. An underlying infectious versus inflammatory process cannot be excluded. Patchy opacities on rt lung-consistent w/ pneumonia No osseus findings on L/T spine  9/23: Due to ongoing pain underwent right dorsal forearm compartment release. Placed Foley 9/25: Irrigation and debridement of right upper extremity  Subjective: Seen and examined. Pain is improving on right upper extremity. Dr. Frazier ButtBenfield in the room Overnight Tmax 99.1, heart rate BP stable, pulse ox stable on room air Labs shows downtrending creatinine  2.2> 2.0, CK  19k> 13k, wbc at 21>20k UOP 2.8 L.  Assessment and Plan: Principal Problem:   Rhabdomyolysis Active Problems:   Compartment syndrome (HCC)   SIRS (systemic inflammatory response syndrome) (HCC)   Elevated troponin   Hyperkalemia   AKI (acute kidney injury) (HCC)   Lactic acidosis   Transaminitis   IV drug abuse (HCC)   Severe Rhabdomyolysis nontraumatic Compartment syndrome RUE w/ necrotic tissue on RUE: Compartment syndrome with rhabdomyolysis due to prolonged laying on right side/unresponsive status in the setting of IVDA. S/P emergent fasciotomy of forearm and hand 9/22 and 9/23 and repeat on 9/25 (Dr. Frazier ButtBenfield).  Patient having uncontrolled pain being managed with IV Dilaudid.  Continue wound care dressing changes per orthopedics.  Ortho planning for I&D again tomorrow continue to monitor neurovascular status right upper extremity, able to mobilize.   Acute kidney injury: Due to rhabdomyolysis no prior labs.Creatinine fluctuating with up to 2.2, IV fluids resumed creatinine downtrending 2.0, nephrology on board and managing.  Continue Foley in place.  Holding Lasix. Recent Labs  Lab 01/09/22 0654 01/09/22 2138 01/10/22 0313 01/11/22 0248 01/12/22 0254  BUN 47* 44* 40* 48* 43*  CREATININE 1.80* 1.88* 1.85* 2.29* 2.08*   Hypocalcemia in the setting of hypoalbuminemia, corrected calcium is stable due to low albumin.   Hyponatremia: Mild, continue IVF.   Metabolic acidosis w/ lactic acidosis: Lactic acid and bicarb level has normalized.   Hyperkalemia: Resolved  Hypokalemia repleted-stable.     RUE/RLE weakness w/ decreased sensation  Prolonged laying on the right side: CT head no acute findings CT L and T-spine no acute finding.  Most likely weakness from compression neuropathy/rhabdomyolysis from laying on the right side,+/-drug use/unresponsive on floor discussed with neurology Dr Zigmund GottronLindzen-he advised MRI of  the brain-patient not able to tolerate MRI due to spasm/pain-this  bide Ativan.(likely low yield).  But now he is able to move his right lower extremity able to briefly lift it up much better than presentation.  Right sided pneumonia likely aspiration pneumonia/Sepsis POA: Pro-Cal elevated 49, wbc In 26k-imaging w/ rt sided pneumonia.  Blood cultures no growth so far.  Leukocytosis slowly downtrending-discontinued vancomycin to minimize nephrotoxicity, switched to cefepime.  Recent Labs  Lab 01/08/22 1739 01/08/22 1909 01/09/22 0038 01/09/22 0058 01/10/22 0313 01/11/22 0248 01/12/22 0254  WBC 26.2*  --  19.8*  --  16.5* 21.3* 20.3*  LATICACIDVEN 4.1* 2.9*  --  1.3  --   --   --   PROCALCITON  --   --   --  49.43  --   --   --      Transaminitis w/ very high AST/ALT/ALP, w/ normal TB: In the setting of rhabdomyolysis/HCV.  LFTs downtrending continue to monitor Hepatitis C- new:Hep C antibody positive and viral load 275,000.  He will need follow-up with [facility] ID clinic please give referral upon discharge or d/w ID. Recent Labs  Lab 01/08/22 1739 01/09/22 0058 01/10/22 0313 01/11/22 0248 01/12/22 0254  AST 1,191* 1,085* 728* 498* 331*  ALT 318* 293* 275* 253* 194*  ALKPHOS 153* 97 82 94 76  BILITOT 0.7 0.7 0.7 0.8 0.6  PROT 7.8 5.4* 5.3* 5.8* 4.9*  ALBUMIN 3.4* 2.4* 2.3* 2.4* 2.0*  INR 1.2 1.5*  --   --   --     Elevated troponin due to demand ischemia from rhabdomyolysis/compartment syndrome, trops 8676>7209>4709> 1200: Dr. Debara Pickett was discussed-no further recommendation, echo with EF 60 to 65%, no RWMA  IVDA admits using fentanyl, urine drug screen negative. Fu blood culture negative, hepatitis C antibody positive HIV negative. IDS negative.  Acute encephalopathy likely multifactorial metabolic and toxic in the setting of IVDA, sepsis.  At this time mentation has improved  High blood pressure no prior history of hypertension likely elevated due to pain.  BP stable now.  I discussed specifically not to leave [place], as it may  lead to incomplete treatment causing significant disability and even death, patient has verbalized, nursing at the bedside.  DVT prophylaxis: SCDs Start: 01/08/22 2020 holding Lovenox. Code Status:   Code Status: Full Code Family Communication: plan of care discussed with patient at bedside.  Patient status is: Inpatient because of severe rhabdomyolysis, compartment syndrome Level of care: Stepdown > monitor closely at risk of decompensation.  Dispo: The patient is from: home            Anticipated disposition: TBD  Mobility Assessment (last 72 hours)     Mobility Assessment   No documentation.         Objective: Vitals last 24 hrs: Vitals:   01/12/22 0300 01/12/22 0400 01/12/22 0500 01/12/22 0600  BP: (!) 142/71 138/73 136/64 133/74  Pulse: 64 66 66 62  Resp: 16 16 15 15   Temp:  97.6 F (36.4 C)    TempSrc:  Oral    SpO2: 100% 99% 97% 100%  Weight:      Height:       Weight change:   Physical Examination: General exam: AAOX3, older than stated age, weak appearing. HEENT:Oral mucosa moist, Ear/Nose WNL grossly, dentition normal. Respiratory system: bilaterally diminished, no use of accessory muscle Cardiovascular system: S1 & S2 +, No JVD,. Gastrointestinal system: Abdomen soft,NT,ND,BS+ Nervous System:Alert, awake, able to move right upper extremity, able  to lift her right leg-he was not able to move his right lower extremity at all on admission Extremities: LE ankle edema NEG, RUE w/ dressing in place-normal capillary refill and intact sensation on the right fingers Skin: No rashes,no icterus. MSK: Normal muscle bulk,tone, power    Medications reviewed:  Scheduled Meds:  calcium carbonate  1 tablet Oral TID   Chlorhexidine Gluconate Cloth  6 each Topical Daily   furosemide  60 mg Intravenous BID   Continuous Infusions:  sodium chloride 150 mL/hr at 01/12/22 0600   sodium chloride 10 mL/hr at 01/12/22 0600   ceFEPime (MAXIPIME) IV Stopped (01/11/22 2340)    dexmedetomidine (PRECEDEX) IV infusion Stopped (01/08/22 2322)      Diet Order             Diet regular Room service appropriate? Yes; Fluid consistency: Thin  Diet effective now                  Intake/Output Summary (Last 24 hours) at 01/12/2022 0109 Last data filed at 01/12/2022 0600 Gross per 24 hour  Intake 3811.42 ml  Output 2915 ml  Net 896.42 ml    Net IO Since Admission: -6,900.63 mL [01/12/22 0712]  Wt Readings from Last 3 Encounters:  01/10/22 79 kg   Unresulted Labs (From admission, onward)     Start     Ordered   01/10/22 0500  Comprehensive metabolic panel  Daily at 5am,   R     Question:  Specimen collection method  Answer:  Unit=Unit collect   01/09/22 0808   01/10/22 0500  CBC  Daily at 5am,   R     Question:  Specimen collection method  Answer:  Unit=Unit collect   01/09/22 0808   01/10/22 0500  CK  Daily at 5am,   R     Question:  Specimen collection method  Answer:  Unit=Unit collect   01/09/22 1011   01/09/22 0500  CBC with Differential/Platelet  Tomorrow morning,   R        01/08/22 2021          Data Reviewed: I have personally reviewed following labs and imaging studies CBC: Recent Labs  Lab 01/08/22 1739 01/08/22 2208 01/09/22 0038 01/10/22 0313 01/11/22 0248 01/12/22 0254  WBC 26.2*  --  19.8* 16.5* 21.3* 20.3*  NEUTROABS 23.6*  --  17.6*  --   --   --   HGB 19.0* 14.6 15.1 11.3* 13.2 11.1*  HCT 54.9* 43.0 44.7 33.0* 37.3* 31.8*  MCV 89.4  --  90.3 91.2 88.0 88.8  PLT 290  --  204 166 198 174    Basic Metabolic Panel: Recent Labs  Lab 01/08/22 1739 01/08/22 2208 01/09/22 0058 01/09/22 0654 01/09/22 2138 01/10/22 0313 01/10/22 1557 01/11/22 0248 01/12/22 0254  NA 134*   < > 132* 131* 130* 134*  --  131* 133*  K 6.1*   < > 5.7* 4.9 4.6 4.1 3.6 3.3* 3.7  CL 106  --  109 106 104 107  --  99 103  CO2 16*  --  18* 19* 19* 20*  --  23 23  GLUCOSE 147*  --  150* 132* 199* 177*  --  178* 176*  BUN 42*  --  42* 47* 44* 40*   --  48* 43*  CREATININE 1.83*  --  1.60* 1.80* 1.88* 1.85*  --  2.29* 2.08*  CALCIUM 7.9*  --  6.6* 7.0* 6.6* 5.9*  --  7.3* 7.0*  MG 2.0  --  2.1  --   --  1.7  --   --   --   PHOS  --   --  2.7  --   --  3.5  --   --   --    < > = values in this interval not displayed.    GFR: Estimated Creatinine Clearance: 52.9 mL/min (A) (by C-G formula based on SCr of 2.08 mg/dL (H)). Liver Function Tests: Recent Labs  Lab 01/08/22 1739 01/09/22 0058 01/10/22 0313 01/11/22 0248 01/12/22 0254  AST 1,191* 1,085* 728* 498* 331*  ALT 318* 293* 275* 253* 194*  ALKPHOS 153* 97 82 94 76  BILITOT 0.7 0.7 0.7 0.8 0.6  PROT 7.8 5.4* 5.3* 5.8* 4.9*  ALBUMIN 3.4* 2.4* 2.3* 2.4* 2.0*    Recent Labs  Lab 01/08/22 1739  LIPASE 35    No results for input(s): "AMMONIA" in the last 168 hours. Coagulation Profile: Recent Labs  Lab 01/08/22 1739 01/09/22 0058  INR 1.2 1.5*    BNP (last 3 results) No results for input(s): "PROBNP" in the last 8760 hours. HbA1C: No results for input(s): "HGBA1C" in the last 72 hours. CBG: Recent Labs  Lab 01/08/22 1959 01/08/22 2347  GLUCAP 119* 144*    Lipid Profile: No results for input(s): "CHOL", "HDL", "LDLCALC", "TRIG", "CHOLHDL", "LDLDIRECT" in the last 72 hours. Thyroid Function Tests: No results for input(s): "TSH", "T4TOTAL", "FREET4", "T3FREE", "THYROIDAB" in the last 72 hours. Sepsis Labs: Recent Labs  Lab 01/08/22 1739 01/08/22 1909 01/09/22 0058  PROCALCITON  --   --  49.43  LATICACIDVEN 4.1* 2.9* 1.3     Recent Results (from the past 240 hour(s))  Urine Culture     Status: None   Collection Time: 01/08/22  5:09 PM   Specimen: In/Out Cath Urine  Result Value Ref Range Status   Specimen Description   Final    IN/OUT CATH URINE Performed at [facility], [address], [place], [place] [address]    Special Requests   Final    NONE Performed at [facility], [address]  [place], [place] [address]    Culture   Final    NO GROWTH Performed at [facility], [address] [place], [place] [address]    Report Status 01/10/2022 FINAL  Final  Blood Culture (routine x 2)     Status: None (Preliminary result)   Collection Time: 01/08/22  5:39 PM   Specimen: BLOOD  Result Value Ref Range Status   Specimen Description   Final    BLOOD RIGHT ANTECUBITAL Performed at [facility], [address] [place], [place] [address]    Special Requests   Final    BOTTLES DRAWN AEROBIC AND ANAEROBIC Blood Culture adequate volume Performed at [facility], [address] [place], [place] [address]    Culture   Final    NO GROWTH 2 DAYS Performed at [facility], [address], [place], [place] [address]    Report Status PENDING  Incomplete  Resp Panel by RT-PCR (Flu A&B, Covid) Anterior Nasal Swab     Status: None   Collection Time: 01/08/22  5:42 PM   Specimen: Anterior Nasal Swab  Result Value Ref Range Status   SARS Coronavirus 2 by RT PCR NEGATIVE NEGATIVE Final    Comment: (NOTE) SARS-CoV-2 target nucleic acids are NOT DETECTED.  The SARS-CoV-2 RNA is  generally detectable in upper respiratory specimens during the acute phase of infection. The lowest concentration of SARS-CoV-2 viral copies this assay can detect is 138 copies/mL. A negative result does not preclude SARS-Cov-2 infection and should not be used as the sole basis for treatment or other patient management decisions. A negative result may occur with  improper specimen collection/handling, submission of specimen other than nasopharyngeal swab, presence of viral mutation(s) within the areas targeted by this assay, and inadequate number of viral copies(<138 copies/mL). A negative result must be combined with clinical observations, patient history, and epidemiological information. The expected result is Negative.  Fact Sheet for Patients:   BloggerCourse.com  Fact Sheet for Healthcare Providers:  SeriousBroker.it  This test is no t yet approved or cleared by the [place] FDA and  has been authorized for detection and/or diagnosis of SARS-CoV-2 by FDA under an Emergency Use Authorization (EUA). This EUA will remain  in effect (meaning this test can be used) for the duration of the COVID-19 declaration under Section 564(b)(1) of the Act, 21 U.S.C.section 360bbb-3(b)(1), unless the authorization is terminated  or revoked sooner.       Influenza A by PCR NEGATIVE NEGATIVE Final   Influenza B by PCR NEGATIVE NEGATIVE Final    Comment: (NOTE) The Xpert Xpress SARS-CoV-2/FLU/RSV plus assay is intended as an aid in the diagnosis of influenza from Nasopharyngeal swab specimens and should not be used as a sole basis for treatment. Nasal washings and aspirates are unacceptable for Xpert Xpress SARS-CoV-2/FLU/RSV testing.  Fact Sheet for Patients: BloggerCourse.com  Fact Sheet for Healthcare Providers: SeriousBroker.it  This test is not yet approved or cleared by the [place] FDA and has been authorized for detection and/or diagnosis of SARS-CoV-2 by FDA under an Emergency Use Authorization (EUA). This EUA will remain in effect (meaning this test can be used) for the duration of the COVID-19 declaration under Section 564(b)(1) of the Act, 21 U.S.C. section 360bbb-3(b)(1), unless the authorization is terminated or revoked.  Performed at [facility], [address], [place], [place] [address]   Blood Culture (routine x 2)     Status: None (Preliminary result)   Collection Time: 01/08/22  6:00 PM   Specimen: BLOOD  Result Value Ref Range Status   Specimen Description   Final    BLOOD LEFT ANTECUBITAL Performed at [facility], [address] [place], [place] [address]     Special Requests   Final    BOTTLES DRAWN AEROBIC AND ANAEROBIC Blood Culture adequate volume Performed at [facility], [address] [place], [place] [address]    Culture   Final    NO GROWTH 2 DAYS Performed at [facility], [address], [place], [place] [address]    Report Status PENDING  Incomplete  MRSA Next Gen by PCR, Nasal     Status: None   Collection Time: 01/09/22  2:53 AM   Specimen: Nasal Mucosa; Nasal Swab  Result Value Ref Range Status   MRSA by PCR Next Gen NOT DETECTED NOT DETECTED Final    Comment: (NOTE) The GeneXpert MRSA Assay (FDA approved for NASAL specimens only), is one component of a comprehensive MRSA colonization surveillance program. It is not intended to diagnose MRSA infection nor to guide or monitor treatment for MRSA infections. Test performance is not FDA approved in patients less than 81 years old. Performed at [facility], [address] [place], [place] [address]  Antimicrobials: Anti-infectives (From admission, onward)    Start     Dose/Rate Route Frequency Ordered Stop   01/11/22 2300  ceFEPIme (MAXIPIME) 2 g in sodium chloride 0.9 % 100 mL IVPB        2 g 200 mL/hr over 30 Minutes Intravenous Every 12 hours 01/11/22 1335     01/09/22 1600  vancomycin (VANCOREADY) IVPB 1500 mg/300 mL  Status:  Discontinued        1,500 mg 150 mL/hr over 120 Minutes Intravenous Every 24 hours 01/09/22 0043 01/10/22 1304   01/09/22 0200  ceFEPIme (MAXIPIME) 2 g in sodium chloride 0.9 % 100 mL IVPB  Status:  Discontinued        2 g 200 mL/hr over 30 Minutes Intravenous Every 8 hours 01/09/22 0043 01/11/22 1335   01/08/22 1715  ceFEPIme (MAXIPIME) 2 g in sodium chloride 0.9 % 100 mL IVPB        2 g 200 mL/hr over 30 Minutes Intravenous  Once 01/08/22 1713 01/08/22 1819   01/08/22 1715  metroNIDAZOLE (FLAGYL) IVPB 500 mg        500 mg 100 mL/hr over 60 Minutes Intravenous  Once 01/08/22 1713 01/08/22  1833   01/08/22 1715  vancomycin (VANCOCIN) IVPB 1000 mg/200 mL premix        1,000 mg 200 mL/hr over 60 Minutes Intravenous  Once 01/08/22 1713 01/08/22 2019      Culture/Microbiology    Component Value Date/Time   SDES  01/08/2022 1800    BLOOD LEFT ANTECUBITAL Performed at [facility], [address] [place], [place] [address]    SPECREQUEST  01/08/2022 1800    BOTTLES DRAWN AEROBIC AND ANAEROBIC Blood Culture adequate volume Performed at [facility], [address] [place], [place] [address]    CULT  01/08/2022 1800    NO GROWTH 2 DAYS Performed at [facility], [address] [place], [place] [address]    REPTSTATUS PENDING 01/08/2022 1800    Other culture-see note  Radiology Studies: No results found.   LOS: 4 days   Lanae Boast, MD [facility]  01/12/2022, 7:12 AM

## 2022-01-12 NOTE — Consult Note (Signed)
[facility]  Reason for Consult:Right forearm wound Referring Physician: Dr. Sherilyn Cooter  Chris Parker is an 31 y.o. male.  HPI: Patient is a very pleasant 31 year old male who was evaluated today at the request of Dr. Tempie Donning with orthopedics for assistance with wound coverage.  He initially presented to the ED on 01/08/2022 for AMS from home, found to have rhabdomyolysis from being down for significant amount of time after possible fentanyl overdose.  Mother reports today that she saw him at approximately 11 PM Thursday evening and found him down around 2 PM on Friday, 01/08/2022.  Mother reports that he was screaming in agony when she found him.  Patient underwent right forearm fasciotomy, hand fasciotomy, carpal tunnel release and application of wound VAC to right forearm on 01/08/2022 with Dr. Tempie Donning. He subsequently developed worsening pain in the dorsal forearm and underwent dorsal forearm compartment release on 01/09/2022.  He then underwent additional debridement on 01/11/2022, wound VAC reapplied. There are plans to return to the operating room tomorrow, 01/13/2022 for additional debridement, wound VAC change.  Patient reports he is doing okay today, he is resting in bed upon evaluation with mother at bedside.  He reports that he had some fevers yesterday, but does not report any today.  He reports that he has some mild sensory changes to the right lower extremity.  Reports difficulty with right upper extremity range of motion.  He is right-handed.    Past Medical History:  Diagnosis Date   IV drug abuse [facility])     Past Surgical History:  Procedure Laterality Date   FASCIECTOMY Right 01/08/2022   Procedure: FASCIECTOMY OF FOREARM AND HAND;  Surgeon: Sherilyn Cooter, MD;  Location: [facility];  Service: Orthopedics;  Laterality: Right;  RIGHT HAND AND FOREARM   FASCIOTOMY Right 01/09/2022   Procedure: RIGHT FOREARM FASCIOTOMY; APPLICATION OF WOUND VAC;  Surgeon:  Sherilyn Cooter, MD;  Location: [facility];  Service: Orthopedics;  Laterality: Right;   I & D EXTREMITY Right 01/11/2022   Procedure: IRRIGATION AND DEBRIDEMENT RIGHT UPPER EXTREMITY;  Surgeon: Sherilyn Cooter, MD;  Location: [facility];  Service: Orthopedics;  Laterality: Right;    History reviewed. No pertinent family history.  Social History:  reports that he has been smoking cigarettes. He does not have any smokeless tobacco history on file. He reports that he does not currently use alcohol. He reports current drug use.  Allergies: No Known Allergies  Medications: I have reviewed the patient's current medications.  Results for orders placed or performed during the hospital encounter of 01/08/22 (from the past 48 hour(s))  Potassium     Status: None   Collection Time: 01/10/22  3:57 PM  Result Value Ref Range   Potassium 3.6 3.5 - 5.1 mmol/L    Comment: Performed at [facility], [place] [address] [place], [place] [address]  Comprehensive metabolic panel     Status: Abnormal   Collection Time: 01/11/22  2:48 AM  Result Value Ref Range   Sodium 131 (L) 135 - 145 mmol/L   Potassium 3.3 (L) 3.5 - 5.1 mmol/L   Chloride 99 98 - 111 mmol/L   CO2 23 22 - 32 mmol/L   Glucose, Bld 178 (H) 70 - 99 mg/dL    Comment: Glucose reference range applies only to samples taken after fasting for at least 8 hours.   BUN 48 (H) 6 - 20 mg/dL   Creatinine, Ser 2.29 (H) 0.61 - 1.24 mg/dL   Calcium 7.3 (  L) 8.9 - 10.3 mg/dL   Total Protein 5.8 (L) 6.5 - 8.1 g/dL   Albumin 2.4 (L) 3.5 - 5.0 g/dL   AST 102 (H) 15 - 41 U/L   ALT 253 (H) 0 - 44 U/L   Alkaline Phosphatase 94 38 - 126 U/L   Total Bilirubin 0.8 0.3 - 1.2 mg/dL   GFR, Estimated 39 (L) >60 mL/min    Comment: (NOTE) Calculated using the CKD-EPI Creatinine Equation (2021)    Anion gap 9 5 - 15    Comment: Performed at [facility], [address] [place], [place] [address]  CBC     Status: Abnormal    Collection Time: 01/11/22  2:48 AM  Result Value Ref Range   WBC 21.3 (H) 4.0 - 10.5 K/uL   RBC 4.24 4.22 - 5.81 MIL/uL   Hemoglobin 13.2 13.0 - 17.0 g/dL   HCT 64.4 (L) 03.4 - 74.2 %   MCV 88.0 80.0 - 100.0 fL   MCH 31.1 26.0 - 34.0 pg   MCHC 35.4 30.0 - 36.0 g/dL   RDW 59.5 63.8 - 75.6 %   Platelets 198 150 - 400 K/uL   nRBC 0.0 0.0 - 0.2 %    Comment: Performed at [facility], [address] [place], [place] [address]  CK     Status: Abnormal   Collection Time: 01/11/22  2:48 AM  Result Value Ref Range   Total CK 19,896 (H) 49 - 397 U/L    Comment: RESULT CONFIRMED BY MANUAL DILUTION Performed at [facility], [address], [place], [place] [address]   Comprehensive metabolic panel     Status: Abnormal   Collection Time: 01/12/22  2:54 AM  Result Value Ref Range   Sodium 133 (L) 135 - 145 mmol/L   Potassium 3.7 3.5 - 5.1 mmol/L   Chloride 103 98 - 111 mmol/L   CO2 23 22 - 32 mmol/L   Glucose, Bld 176 (H) 70 - 99 mg/dL    Comment: Glucose reference range applies only to samples taken after fasting for at least 8 hours.   BUN 43 (H) 6 - 20 mg/dL   Creatinine, Ser 1.66 (H) 0.61 - 1.24 mg/dL   Calcium 7.0 (L) 8.9 - 10.3 mg/dL   Total Protein 4.9 (L) 6.5 - 8.1 g/dL   Albumin 2.0 (L) 3.5 - 5.0 g/dL   AST 063 (H) 15 - 41 U/L   ALT 194 (H) 0 - 44 U/L   Alkaline Phosphatase 76 38 - 126 U/L   Total Bilirubin 0.6 0.3 - 1.2 mg/dL   GFR, Estimated 44 (L) >60 mL/min    Comment: (NOTE) Calculated using the CKD-EPI Creatinine Equation (2021)    Anion gap 7 5 - 15    Comment: Performed at [facility], [address], [place], [place] [address]  CBC     Status: Abnormal   Collection Time: 01/12/22  2:54 AM  Result Value Ref Range   WBC 20.3 (H) 4.0 - 10.5 K/uL   RBC 3.58 (L) 4.22 - 5.81 MIL/uL   Hemoglobin 11.1 (L) 13.0 - 17.0 g/dL   HCT 09.3 (L) 23.5 - 57.3 %   MCV 88.8 80.0 - 100.0 fL   MCH 31.0 26.0 - 34.0 pg    MCHC 34.9 30.0 - 36.0 g/dL   RDW 22.0 25.4 - 27.0 %   Platelets 174 150 - 400 K/uL   nRBC 0.0 0.0 - 0.2 %  Comment: Performed at [facility], [address], [place], [place] [address]  CK     Status: Abnormal   Collection Time: 01/12/22  2:54 AM  Result Value Ref Range   Total CK 13,646 (H) 49 - 397 U/L    Comment: RESULT CONFIRMED BY MANUAL DILUTION Performed at [facility], [address] [place], [place] [address]     No results found.  Review of Systems  Constitutional:  Positive for diaphoresis. Negative for fever.  Cardiovascular:  Positive for leg swelling. Negative for chest pain.  Musculoskeletal:  Positive for myalgias (chest/arm from fall).  Neurological:  Positive for tingling (RLE) and focal weakness.   Blood pressure 137/83, pulse 79, temperature 98.2 F (36.8 C), temperature source Oral, resp. rate 14, height 5\' 9"  (1.753 m), weight 79 kg, SpO2 100 %. Physical Exam Constitutional:      General: He is not in acute distress.    Appearance: He is diaphoretic.  HENT:     Head: Normocephalic.  Cardiovascular:     Comments: Palpable DP pulses noted bilaterally, some swelling of right lower extremity noted.  Minimally decreased sensation to right lower extremity. Pulmonary:     Effort: Pulmonary effort is normal.  Musculoskeletal:     Right lower leg: Edema present.     Left lower leg: No edema.     Comments: Right hand/forearm with dressing in place.  Hand well-perfused.  Blistering noted of right fifth digit.  Decreased range of motion of right hand.  Wound VAC in place with 200 cc of dark serosanguineous drainage.  Mild swelling of proximal right upper extremity  Weak extension and flexion of right elbow  Neurological:     Mental Status: He is alert.  Psychiatric:        Mood and Affect: Mood normal.        Behavior: Behavior normal.     Assessment/Plan: Right upper extremity wound status post  fasciotomies:  Patient is scheduled for additional debridement tomorrow with Dr. with orthopedics, planning to take pictures and place them in the chart for plastic surgery to review.  Will discuss surgical plan with plastic surgery team and update after reviewing EMR photos.  Wound care per orthopedics, wound VAC in place, functioning properly. DVT prophylaxis per primary team  Appreciate the consultation, please call with any questions or concerns.   Frazier Butt Jie Stickels, PA-C 01/12/2022, 3:03 PM

## 2022-01-13 ENCOUNTER — Inpatient Hospital Stay (HOSPITAL_COMMUNITY): Payer: Medicaid Other | Admitting: Certified Registered Nurse Anesthetist

## 2022-01-13 ENCOUNTER — Other Ambulatory Visit: Payer: Self-pay

## 2022-01-13 ENCOUNTER — Encounter (HOSPITAL_COMMUNITY)
Admission: EM | Disposition: A | Discharge status: Home Health | Payer: Self-pay | Source: Home / Self Care | Attending: Internal Medicine

## 2022-01-13 DIAGNOSIS — F17210 Nicotine dependence, cigarettes, uncomplicated: Secondary | ICD-10-CM | POA: Diagnosis not present

## 2022-01-13 DIAGNOSIS — T79A11A Traumatic compartment syndrome of right upper extremity, initial encounter: Secondary | ICD-10-CM

## 2022-01-13 DIAGNOSIS — R778 Other specified abnormalities of plasma proteins: Secondary | ICD-10-CM | POA: Diagnosis not present

## 2022-01-13 DIAGNOSIS — N289 Disorder of kidney and ureter, unspecified: Secondary | ICD-10-CM

## 2022-01-13 DIAGNOSIS — M6282 Rhabdomyolysis: Secondary | ICD-10-CM | POA: Diagnosis not present

## 2022-01-13 DIAGNOSIS — N179 Acute kidney failure, unspecified: Secondary | ICD-10-CM | POA: Diagnosis not present

## 2022-01-13 DIAGNOSIS — R7401 Elevation of levels of liver transaminase levels: Secondary | ICD-10-CM | POA: Diagnosis not present

## 2022-01-13 HISTORY — PX: I & D EXTREMITY: SHX5045

## 2022-01-13 LAB — COMPREHENSIVE METABOLIC PANEL
ALT: 173 U/L — ABNORMAL HIGH (ref 0–44)
AST: 258 U/L — ABNORMAL HIGH (ref 15–41)
Albumin: 2.1 g/dL — ABNORMAL LOW (ref 3.5–5.0)
Alkaline Phosphatase: 72 U/L (ref 38–126)
Anion gap: 8 (ref 5–15)
BUN: 50 mg/dL — ABNORMAL HIGH (ref 6–20)
CO2: 27 mmol/L (ref 22–32)
Calcium: 7.4 mg/dL — ABNORMAL LOW (ref 8.9–10.3)
Chloride: 99 mmol/L (ref 98–111)
Creatinine, Ser: 2.03 mg/dL — ABNORMAL HIGH (ref 0.61–1.24)
GFR, Estimated: 45 mL/min — ABNORMAL LOW (ref >60)
Glucose, Bld: 120 mg/dL — ABNORMAL HIGH (ref 70–99)
Potassium: 3 mmol/L — ABNORMAL LOW (ref 3.5–5.1)
Sodium: 134 mmol/L — ABNORMAL LOW (ref 135–145)
Total Bilirubin: 0.5 mg/dL (ref 0.3–1.2)
Total Protein: 5.3 g/dL — ABNORMAL LOW (ref 6.5–8.1)

## 2022-01-13 LAB — CULTURE, BLOOD (ROUTINE X 2)
Culture: NO GROWTH
Culture: NO GROWTH
Special Requests: ADEQUATE
Special Requests: ADEQUATE

## 2022-01-13 LAB — CBC
HCT: 31.6 % — ABNORMAL LOW (ref 39.0–52.0)
Hemoglobin: 11 g/dL — ABNORMAL LOW (ref 13.0–17.0)
MCH: 30.7 pg (ref 26.0–34.0)
MCHC: 34.8 g/dL (ref 30.0–36.0)
MCV: 88.3 fL (ref 80.0–100.0)
Platelets: 205 10*3/uL (ref 150–400)
RBC: 3.58 MIL/uL — ABNORMAL LOW (ref 4.22–5.81)
RDW: 12.9 % (ref 11.5–15.5)
WBC: 20.6 10*3/uL — ABNORMAL HIGH (ref 4.0–10.5)
nRBC: 0 % (ref 0.0–0.2)

## 2022-01-13 LAB — CK: Total CK: 6562 U/L — ABNORMAL HIGH (ref 49–397)

## 2022-01-13 SURGERY — IRRIGATION AND DEBRIDEMENT EXTREMITY
Anesthesia: General | Site: Arm Lower | Laterality: Right

## 2022-01-13 MED ORDER — DEXAMETHASONE SODIUM PHOSPHATE 10 MG/ML IJ SOLN
INTRAMUSCULAR | Status: DC | PRN
  Administered 2022-01-13: 10 mg via INTRAVENOUS

## 2022-01-13 MED ORDER — HYDROMORPHONE HCL 1 MG/ML IJ SOLN
0.2500 mg | INTRAMUSCULAR | Status: DC | PRN
  Administered 2022-01-13: 0.5 mg via INTRAVENOUS

## 2022-01-13 MED ORDER — ONDANSETRON HCL 4 MG/2ML IJ SOLN: 4.0000 mg | Freq: Once | INTRAMUSCULAR | Status: DC | PRN

## 2022-01-13 MED ORDER — SODIUM CHLORIDE 0.9 % IV SOLN: INTRAVENOUS | Status: DC

## 2022-01-13 MED ORDER — SODIUM CHLORIDE 0.9 % IR SOLN
Status: DC | PRN
  Administered 2022-01-13: 3000 mL

## 2022-01-13 MED ORDER — OXYCODONE HCL 5 MG/5ML PO SOLN: 5.0000 mg | Freq: Once | ORAL | Status: DC | PRN

## 2022-01-13 MED ORDER — LACTATED RINGERS IV SOLN: INTRAVENOUS | Status: DC | PRN

## 2022-01-13 MED ORDER — ONDANSETRON HCL 4 MG/2ML IJ SOLN
INTRAMUSCULAR | Status: DC | PRN
  Administered 2022-01-13: 4 mg via INTRAVENOUS

## 2022-01-13 MED ORDER — 0.9 % SODIUM CHLORIDE (POUR BTL) OPTIME
TOPICAL | Status: DC | PRN
  Administered 2022-01-13: 500 mL

## 2022-01-13 MED ORDER — ACETAMINOPHEN 500 MG PO TABS
ORAL_TABLET | ORAL | Status: AC
  Filled 2022-01-13: qty 2

## 2022-01-13 MED ORDER — MIDAZOLAM HCL 2 MG/2ML IJ SOLN
INTRAMUSCULAR | Status: DC | PRN
  Administered 2022-01-13: 2 mg via INTRAVENOUS

## 2022-01-13 MED ORDER — KETAMINE HCL 10 MG/ML IJ SOLN
INTRAMUSCULAR | Status: AC
  Filled 2022-01-13: qty 1

## 2022-01-13 MED ORDER — FENTANYL CITRATE (PF) 100 MCG/2ML IJ SOLN
INTRAMUSCULAR | Status: DC | PRN
  Administered 2022-01-13: 25 ug via INTRAVENOUS
  Administered 2022-01-13: 50 ug via INTRAVENOUS

## 2022-01-13 MED ORDER — MIDAZOLAM HCL 2 MG/2ML IJ SOLN
INTRAMUSCULAR | Status: AC
  Filled 2022-01-13: qty 2

## 2022-01-13 MED ORDER — CHLORHEXIDINE GLUCONATE 0.12 % MT SOLN
15.0000 mL | Freq: Once | OROMUCOSAL | Status: AC
  Administered 2022-01-13: 15 mL via OROMUCOSAL

## 2022-01-13 MED ORDER — DEXAMETHASONE SODIUM PHOSPHATE 10 MG/ML IJ SOLN
INTRAMUSCULAR | Status: AC
  Filled 2022-01-13: qty 1

## 2022-01-13 MED ORDER — ACETAMINOPHEN 500 MG PO TABS
1000.0000 mg | ORAL_TABLET | Freq: Once | ORAL | Status: AC
  Administered 2022-01-13: 1000 mg via ORAL

## 2022-01-13 MED ORDER — KETOROLAC TROMETHAMINE 30 MG/ML IJ SOLN
INTRAMUSCULAR | Status: AC
  Filled 2022-01-13: qty 1

## 2022-01-13 MED ORDER — PROPOFOL 10 MG/ML IV BOLUS
INTRAVENOUS | Status: AC
  Filled 2022-01-13: qty 20

## 2022-01-13 MED ORDER — KETAMINE HCL 10 MG/ML IJ SOLN
INTRAMUSCULAR | Status: DC | PRN
  Administered 2022-01-13: 30 mg via INTRAVENOUS

## 2022-01-13 MED ORDER — POTASSIUM CHLORIDE CRYS ER 20 MEQ PO TBCR
40.0000 meq | EXTENDED_RELEASE_TABLET | Freq: Once | ORAL | Status: AC
  Administered 2022-01-13: 40 meq via ORAL
  Filled 2022-01-13: qty 2

## 2022-01-13 MED ORDER — HYDROMORPHONE HCL 1 MG/ML IJ SOLN
INTRAMUSCULAR | Status: AC
  Administered 2022-01-13: 0.5 mg via INTRAVENOUS
  Filled 2022-01-13: qty 1

## 2022-01-13 MED ORDER — AMISULPRIDE (ANTIEMETIC) 5 MG/2ML IV SOLN: 10.0000 mg | Freq: Once | INTRAVENOUS | Status: DC | PRN

## 2022-01-13 MED ORDER — ONDANSETRON HCL 4 MG/2ML IJ SOLN
INTRAMUSCULAR | Status: AC
  Filled 2022-01-13: qty 2

## 2022-01-13 MED ORDER — PROPOFOL 10 MG/ML IV BOLUS
INTRAVENOUS | Status: DC | PRN
  Administered 2022-01-13: 200 mg via INTRAVENOUS

## 2022-01-13 MED ORDER — OXYCODONE HCL 5 MG PO TABS: 5.0000 mg | ORAL_TABLET | Freq: Once | ORAL | Status: DC | PRN

## 2022-01-13 MED ORDER — FENTANYL CITRATE (PF) 100 MCG/2ML IJ SOLN
INTRAMUSCULAR | Status: AC
  Filled 2022-01-13: qty 2

## 2022-01-13 MED ORDER — BUPIVACAINE HCL 0.25 % IJ SOLN
INTRAMUSCULAR | Status: AC
  Filled 2022-01-13: qty 1

## 2022-01-13 MED ORDER — LIDOCAINE 2% (20 MG/ML) 5 ML SYRINGE
INTRAMUSCULAR | Status: DC | PRN
  Administered 2022-01-13: 60 mg via INTRAVENOUS

## 2022-01-13 SURGICAL SUPPLY — 68 items
BAG COUNTER SPONGE SURGICOUNT (BAG) ×1 IMPLANT
BLADE SURG 15 STRL LF DISP TIS (BLADE) ×2 IMPLANT
BLADE SURG 15 STRL SS (BLADE) ×2
BNDG ELASTIC 2X5.8 VLCR STR LF (GAUZE/BANDAGES/DRESSINGS) ×1 IMPLANT
BNDG ELASTIC 3X5.8 VLCR STR LF (GAUZE/BANDAGES/DRESSINGS) ×1 IMPLANT
BNDG ELASTIC 4X5.8 VLCR STR LF (GAUZE/BANDAGES/DRESSINGS) ×1 IMPLANT
BNDG ESMARK 4X9 LF (GAUZE/BANDAGES/DRESSINGS) ×1 IMPLANT
BNDG GAUZE DERMACEA FLUFF 4 (GAUZE/BANDAGES/DRESSINGS) ×3 IMPLANT
BNDG PLASTER X FAST 3X3 WHT LF (CAST SUPPLIES) ×1 IMPLANT
CATH ROBINSON RED A/P 10FR (CATHETERS) IMPLANT
CHLORAPREP W/TINT 26 (MISCELLANEOUS) ×1 IMPLANT
CORD BIPOLAR FORCEPS 12FT (ELECTRODE) ×1 IMPLANT
COVER BACK TABLE 60X90IN (DRAPES) ×1 IMPLANT
COVER MAYO STAND STRL (DRAPES) ×1 IMPLANT
COVER SURGICAL LIGHT HANDLE (MISCELLANEOUS) ×1 IMPLANT
CUFF TOURN SGL QUICK 18X4 (TOURNIQUET CUFF) ×1 IMPLANT
CUFF TOURN SGL QUICK 24 (TOURNIQUET CUFF)
CUFF TRNQT CYL 24X4X16.5-23 (TOURNIQUET CUFF) IMPLANT
DRAIN PENROSE 0.25X18 (DRAIN) ×1 IMPLANT
DRAPE EXTREMITY T 121X128X90 (DISPOSABLE) ×1 IMPLANT
DRAPE OEC MINIVIEW 54X84 (DRAPES) IMPLANT
DRAPE SHEET LG 3/4 BI-LAMINATE (DRAPES) ×1 IMPLANT
DRAPE SURG 17X23 STRL (DRAPES) ×1 IMPLANT
DRSG ADAPTIC 3X8 NADH LF (GAUZE/BANDAGES/DRESSINGS) ×1 IMPLANT
DRSG EMULSION OIL 3X16 NADH (GAUZE/BANDAGES/DRESSINGS) IMPLANT
DRSG VAC ATS MED SENSATRAC (GAUZE/BANDAGES/DRESSINGS) IMPLANT
GAUZE PAD ABD 8X10 STRL (GAUZE/BANDAGES/DRESSINGS) ×1 IMPLANT
GAUZE SPONGE 4X4 12PLY STRL (GAUZE/BANDAGES/DRESSINGS) ×1 IMPLANT
GAUZE STRETCH 2X75IN STRL (MISCELLANEOUS) IMPLANT
GAUZE XEROFORM 1X8 LF (GAUZE/BANDAGES/DRESSINGS) ×1 IMPLANT
GLOVE BIOGEL M 7.0 STRL (GLOVE) ×1 IMPLANT
GLOVE BIOGEL PI IND STRL 7.0 (GLOVE) ×1 IMPLANT
GLOVE SS BIOGEL STRL SZ 8 (GLOVE) ×1 IMPLANT
GLOVE SURG LX STRL 8.0 MICRO (GLOVE) ×1 IMPLANT
GOWN STRL REUS W/ TWL LRG LVL3 (GOWN DISPOSABLE) ×2 IMPLANT
GOWN STRL REUS W/ TWL XL LVL3 (GOWN DISPOSABLE) ×2 IMPLANT
GOWN STRL REUS W/TWL LRG LVL3 (GOWN DISPOSABLE) ×2
GOWN STRL REUS W/TWL XL LVL3 (GOWN DISPOSABLE) ×2
KIT BASIN OR (CUSTOM PROCEDURE TRAY) ×1 IMPLANT
KIT TURNOVER KIT A (KITS) ×1 IMPLANT
LOOP VESSEL MAXI BLUE (MISCELLANEOUS) ×1 IMPLANT
MANIFOLD NEPTUNE II (INSTRUMENTS) ×1 IMPLANT
NDL HYPO 25X1 1.5 SAFETY (NEEDLE) ×1 IMPLANT
NEEDLE ANCHOR KEITH 2 7/8 STR (NEEDLE) IMPLANT
NEEDLE HYPO 25X1 1.5 SAFETY (NEEDLE) ×1 IMPLANT
NS IRRIG 1000ML POUR BTL (IV SOLUTION) ×1 IMPLANT
PACK ORTHO EXTREMITY (CUSTOM PROCEDURE TRAY) ×1 IMPLANT
PAD ARMBOARD 7.5X6 YLW CONV (MISCELLANEOUS) ×1 IMPLANT
PAD CAST 4YDX4 CTTN HI CHSV (CAST SUPPLIES) ×2 IMPLANT
PADDING CAST ABS COTTON 3X4 (CAST SUPPLIES) ×1 IMPLANT
PADDING CAST COTTON 4X4 STRL (CAST SUPPLIES) ×2
SET IRRIG Y TYPE TUR BLADDER L (SET/KITS/TRAYS/PACK) ×1 IMPLANT
SOL PREP POV-IOD 4OZ 10% (MISCELLANEOUS) ×2 IMPLANT
SPIKE FLUID TRANSFER (MISCELLANEOUS) ×1 IMPLANT
SPONGE T-LAP 4X18 ~~LOC~~+RFID (SPONGE) ×1 IMPLANT
SUT ETHILON 2 0 PS N (SUTURE) IMPLANT
SUT ETHILON 4 0 PS 2 18 (SUTURE) ×3 IMPLANT
SUT FIBERWIRE 3-0 18 TAPR NDL (SUTURE) ×1
SUT PROLENE 0 CT 1 30 (SUTURE) IMPLANT
SUTURE FIBERWR 3-0 18 TAPR NDL (SUTURE) ×1 IMPLANT
SWAB CULTURE ESWAB REG 1ML (MISCELLANEOUS) IMPLANT
SYR BULB EAR ULCER 3OZ GRN STR (SYRINGE) ×1 IMPLANT
SYR CONTROL 10ML LL (SYRINGE) IMPLANT
TOWEL OR NON WOVEN STRL DISP B (DISPOSABLE) ×2 IMPLANT
TUBING CONNECTING 10 (TUBING) ×1 IMPLANT
UNDERPAD 30X36 HEAVY ABSORB (UNDERPADS AND DIAPERS) ×1 IMPLANT
WATER STERILE IRR 1000ML POUR (IV SOLUTION) ×1 IMPLANT
YANKAUER SUCT BULB TIP NO VENT (SUCTIONS) ×1 IMPLANT

## 2022-01-13 NOTE — Interval H&P Note (Signed)
History and Physical Interval Note:  01/13/2022 4:02 PM  Chris Parker  has presented today for surgery, with the diagnosis of COMPARTMENT SYNDROME RIGHT UPPER EXTREMITY.  The various methods of treatment have been discussed with the patient and family. After consideration of risks, benefits and other options for treatment, the patient has consented to  Procedure(s): IRRIGATION AND DEBRIDEMENT EXTREMITY (Right) as a surgical intervention.  The patient's history has been reviewed, patient examined, no change in status, stable for surgery.  I have reviewed the patient's chart and labs.  Questions were answered to the patient's satisfaction.     Hershel Corkery Akshita Italiano

## 2022-01-13 NOTE — Transfer of Care (Signed)
Immediate Anesthesia Transfer of Care Note  Patient: Chris Parker  Procedure(s) Performed: Procedure(s): IRRIGATION AND DEBRIDEMENT forearm (Right)  Patient Location: PACU  Anesthesia Type:General  Level of Consciousness: Alert, Awake, Oriented  Airway & Oxygen Therapy: Patient Spontanous Breathing  Post-op Assessment: Report given to RN  Post vital signs: Reviewed and stable  Last Vitals:  Vitals:   01/13/22 1400 01/13/22 1522  BP: 130/79 (!) 151/86  Pulse: 89 78  Resp: 11 18  Temp:  37.1 C  SpO2: 85% 88%    Complications: No apparent anesthesia complications

## 2022-01-13 NOTE — Progress Notes (Signed)
[facility] Progress Note  Subjective: Cr plateaued  Vitals:   01/13/22 1000 01/13/22 1100 01/13/22 1200 01/13/22 1300  BP: (!) 152/85 (!) 156/87 (!) 151/87 (!) 167/84  Pulse: 71 72 71 79  Resp: 12 14 16 14   Temp:   98.3 F (36.8 C)   TempSrc:   Oral   SpO2: 99% 97% 95% 97%  Weight:      Height:        Exam: Gen alert, no distress No jvd or bruits Chest clear bilat to bases, no rales/ wheezing RRR no MRG Abd soft ntnd no mass or ascites +bs Ext R arm wrapped, in sterile OR dressing Neuro is alert, Ox 3 , nf     Assessment/ Plan: AKI - no old labs. Creat here 1.8 on admission in setting of acute rhabdomyolysis related to drug OD and compression muscle injury. CT abd shows normal kidneys w/o obstruction. UA normal, urine lytes c/w ATN. Pt was admitted and rec'd 4.5 L bolus in ED + LR at 150 cc/hr. He went to OR then for compartment syndrome.   - got aggressive IVFs and got a little volume overloaded  - now no LE edema, not on O2 and appears euvolemic  - CK downtrending.  Does not need fluids or lasix- have dc'd.  Expect Cr will go down to WNL but will take time.    - nothing further to add- will sign off.  Call with questions. Acute rhabdomyolysis - due to drug OD and compression injury. CPK > 50,000 down to 1400--> up to 19,000 and now is s/p OR with ortho--> 13,000 Hyperkalemia - resolved w/ lokelma + resolution of acute rhabdo episode, now off Compartment syndrome - per orthopedic surgeons    Madelon Lips MD 01/13/2022, 2:00 PM   Recent Labs  Lab 01/09/22 0058 01/09/22 0654 01/10/22 0313 01/10/22 1557 01/12/22 0254 01/13/22 0244  HGB  --   --  11.3*   < > 11.1* 11.0*  ALBUMIN 2.4*  --  2.3*   < > 2.0* 2.1*  CALCIUM 6.6*   < > 5.9*   < > 7.0* 7.4*  PHOS 2.7  --  3.5  --   --   --   CREATININE 1.60*   < > 1.85*   < > 2.08* 2.03*  K 5.7*   < > 4.1   < > 3.7 3.0*   < > = values in this interval not displayed.   No results for input(s): "IRON",  "TIBC", "FERRITIN" in the last 168 hours. Inpatient medications:  calcium carbonate  1 tablet Oral TID   Chlorhexidine Gluconate Cloth  6 each Topical Daily    sodium chloride Stopped (01/13/22 0950)   ceFEPime (MAXIPIME) IV Stopped (01/13/22 1020)   sodium chloride, acetaminophen **OR** acetaminophen, hydrALAZINE, HYDROmorphone (DILAUDID) injection, LORazepam, naLOXone (NARCAN)  injection, ondansetron (ZOFRAN) IV, mouth rinse, oxyCODONE

## 2022-01-13 NOTE — Progress Notes (Signed)
[facility] PROGRESS NOTE   Chris Parker YFV:[id number] DOB: Sep 06, 1990 DOA: 01/08/2022  PCP: Patient, No Pcp Per  Brief History/Interval Summary: 30-yom w/ history of IVDA, found down on the ground on his right side for unclear duration of time,reportedly with needle sticking out of right arm. In ED- in severe rhabdomyolysis w/ compartment syndrome Rt UE  s/p OR by Dr. Tempie Donning- emergent fasciotomy RUE, placed on aggressive IVF, cxr-right-sided airspace opacities ?asymmetric edema given that the patient was found on his right sidem procal and CTA ordere  placed on emperic broad-spectrum IV antibiotics with IV Vanco and cefepime. Also with hyperkalemia, s/p IV insulin and Lokelma, AKI creat 1.8, metabolci acidosis, transaminitis, w/ elevated troponin discussed w/ Dr. Debara Pickett, who felt that this elevation was less likely represent acute MI, but more likely on the basis of type II supply/demand mismatch- no heparin added but troponin trended and echocardiogram. ABG w/ 7.29/41/158/21.    9/23: Due to ongoing pain underwent right dorsal forearm compartment release. Placed Foley 9/25: Irrigation and debridement of right upper extremity 9/27: to Undergo repeat debridement on 9/27  Consultants: Plastic surgery.  Orthopedics.  Nephrology.  Procedures: Emergency fasciotomy right upper extremity.  Multiple debridements to the right upper extremity    Subjective/Interval History: Patient denies any significant pain in the right arm.  Able to move his right arm and right leg.  Denies any nausea vomiting.  No shortness of breath.    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  S/P emergent fasciotomy of forearm and hand 9/22 and 9/23 and repeat on 9/25 (Dr. Tempie Donning).   Another I&D being considered by  orthopedics for today. He is able to move his right arm and leg better than before.  Continue with pain medications.    Acute kidney injury Due to rhabdomyolysis. No prior labs. Nephrology was consulted to assist with management. 0  Foley catheter was placed.  Renal function gradually improving but appears to have plateaued.  Monitor urine output.  Avoid nephrotoxic agents.   CT scan of the abdomen pelvis did not show any hydronephrosis.   Noted to be on furosemide.  Hypocalcemia in the setting of hypoalbuminemia Corrected calcium is stable  Hyponatremia Mild, continue IVF.    Metabolic acidosis w/ lactic acidosis Lactic acid and bicarb level has normalized.    Potassium abnormalities, hyperkalemia followed by hypokalemia Resolved.  Now with hypokalemia.  A dose of potassium was given this morning.   RUE/RLE weakness w/ decreased sensation  CT head no acute findings CT L and T-spine no acute finding.  Most likely weakness from compression neuropathy/rhabdomyolysis from laying on the right side,+/-drug use/unresponsive on floor. Appears to be regaining strength on the right side.  MRI was ordered but was unable to tolerate due to severe spasms and pain.  Despite Ativan this could not be accomplished Since his strength appears to be improving we can hold off on the MRI for now.    Right sided pneumonia likely aspiration pneumonia/Sepsis POA: Pro-Cal elevated 49, wbc In 26k-imaging w/ rt sided pneumonia.  Blood cultures no growth so far.   WBC remains elevated. Vancomycin was discontinued.  Noted to be on cefepime.  Respiratory status is stable.     Transaminitis Significantly high AST ALT likely due to rhabdomyolysis.  Improving trends noted.  Hepatitis C also contributing.  No acute abnormalities noted on  ultrasound of the right upper quadrant.  Hepatitis C Hep C antibody positive and viral load 275,000.  He will need follow-up with [facility] ID discharge.  This appears to be a new  diagnosis.  Elevated troponin  Due to demand ischemia from rhabdomyolysis/compartment syndrome, trops [phone]: Dr. Rennis Golden was discussed-no further recommendation, echo with EF 60 to 65%, no RWMA   IVDA  Admits using fentanyl, urine drug screen negative.    Acute metabolic encephalopathy Likely multifactorial metabolic and toxic in the setting of IVDA, sepsis.  At this time mentation has improved   High blood pressure no prior history of hypertension  Likely elevated due to pain.  Continue to monitor.  DVT Prophylaxis: SCDs Code Status: Full code Family Communication: Discussed with patient Disposition Plan: To be determined  Status is: Inpatient Remains inpatient appropriate because: Compartment syndrome, rhabdomyolysis, acute kidney injury      Medications: Scheduled:  calcium carbonate  1 tablet Oral TID   Chlorhexidine Gluconate Cloth  6 each Topical Daily   furosemide  60 mg Intravenous BID   Continuous:  sodium chloride Stopped (01/13/22 0950)   ceFEPime (MAXIPIME) IV Stopped (01/13/22 1020)   QIO:NGEXBM chloride, acetaminophen **OR** acetaminophen, hydrALAZINE, HYDROmorphone (DILAUDID) injection, LORazepam, naLOXone (NARCAN)  injection, ondansetron (ZOFRAN) IV, mouth rinse, oxyCODONE  Antibiotics: Anti-infectives (From admission, onward)    Start     Dose/Rate Route Frequency Ordered Stop   01/11/22 2300  ceFEPIme (MAXIPIME) 2 g in sodium chloride 0.9 % 100 mL IVPB        2 g 200 mL/hr over 30 Minutes Intravenous Every 12 hours 01/11/22 1335     01/09/22 1600  vancomycin (VANCOREADY) IVPB 1500 mg/300 mL  Status:  Discontinued        1,500 mg 150 mL/hr over 120 Minutes Intravenous Every 24 hours 01/09/22 0043 01/10/22 1304   01/09/22 0200  ceFEPIme (MAXIPIME) 2 g in sodium chloride 0.9 % 100 mL IVPB  Status:  Discontinued        2 g 200 mL/hr over 30 Minutes Intravenous Every 8 hours 01/09/22 0043 01/11/22 1335   01/08/22 1715  ceFEPIme (MAXIPIME) 2  g in sodium chloride 0.9 % 100 mL IVPB        2 g 200 mL/hr over 30 Minutes Intravenous  Once 01/08/22 1713 01/08/22 1819   01/08/22 1715  metroNIDAZOLE (FLAGYL) IVPB 500 mg        500 mg 100 mL/hr over 60 Minutes Intravenous  Once 01/08/22 1713 01/08/22 1833   01/08/22 1715  vancomycin (VANCOCIN) IVPB 1000 mg/200 mL premix        1,000 mg 200 mL/hr over 60 Minutes Intravenous  Once 01/08/22 1713 01/08/22 2019       Objective:  Vital Signs  Vitals:   01/13/22 0800 01/13/22 0900 01/13/22 1000 01/13/22 1100  BP: (!) 157/78 (!) 153/80 (!) 152/85 (!) 156/87  Pulse: 71 69 71 72  Resp: 12 13 12 14   Temp:      TempSrc:      SpO2: 97% 98% 99% 97%  Weight:      Height:        Intake/Output Summary (Last 24 hours) at 01/13/2022 1118 Last data filed at 01/13/2022 1000 Gross per 24 hour  Intake 348.68 ml  Output 5100 ml  Net -4751.32 ml   Filed Weights   01/08/22 1836 01/09/22 0033 01/10/22 0500  Weight: 76.2 kg 78.5 kg 79 kg    General appearance: Awake alert.  In no  distress Resp: Clear to auscultation bilaterally.  Normal effort Cardio: S1-S2 is normal regular.  No S3-S4.  No rubs murmurs or bruit GI: Abdomen is soft.  Nontender nondistended.  Bowel sounds are present normal.  No masses organomegaly Extremities: Right arm covered in dressing Neurologic: Alert and oriented x3.  No focal neurological deficits.    Lab Results:  Data Reviewed: I have personally reviewed following labs and reports of the imaging studies  CBC: Recent Labs  Lab 01/08/22 1739 01/08/22 2208 01/09/22 0038 01/10/22 0313 01/11/22 0248 01/12/22 0254 01/13/22 0244  WBC 26.2*  --  19.8* 16.5* 21.3* 20.3* 20.6*  NEUTROABS 23.6*  --  17.6*  --   --   --   --   HGB 19.0*   < > 15.1 11.3* 13.2 11.1* 11.0*  HCT 54.9*   < > 44.7 33.0* 37.3* 31.8* 31.6*  MCV 89.4  --  90.3 91.2 88.0 88.8 88.3  PLT 290  --  204 166 198 174 205   < > = values in this interval not displayed.    Basic Metabolic  Panel: Recent Labs  Lab 01/08/22 1739 01/08/22 2208 01/09/22 0058 01/09/22 0654 01/09/22 2138 01/10/22 0313 01/10/22 1557 01/11/22 0248 01/12/22 0254 01/13/22 0244  NA 134*   < > 132*   < > 130* 134*  --  131* 133* 134*  K 6.1*   < > 5.7*   < > 4.6 4.1 3.6 3.3* 3.7 3.0*  CL 106  --  109   < > 104 107  --  99 103 99  CO2 16*  --  18*   < > 19* 20*  --  23 23 27   GLUCOSE 147*  --  150*   < > 199* 177*  --  178* 176* 120*  BUN 42*  --  42*   < > 44* 40*  --  48* 43* 50*  CREATININE 1.83*  --  1.60*   < > 1.88* 1.85*  --  2.29* 2.08* 2.03*  CALCIUM 7.9*  --  6.6*   < > 6.6* 5.9*  --  7.3* 7.0* 7.4*  MG 2.0  --  2.1  --   --  1.7  --   --   --   --   PHOS  --   --  2.7  --   --  3.5  --   --   --   --    < > = values in this interval not displayed.    GFR: Estimated Creatinine Clearance: 54.2 mL/min (A) (by C-G formula based on SCr of 2.03 mg/dL (H)).  Liver Function Tests: Recent Labs  Lab 01/09/22 0058 01/10/22 0313 01/11/22 0248 01/12/22 0254 01/13/22 0244  AST 1,085* 728* 498* 331* 258*  ALT 293* 275* 253* 194* 173*  ALKPHOS 97 82 94 76 72  BILITOT 0.7 0.7 0.8 0.6 0.5  PROT 5.4* 5.3* 5.8* 4.9* 5.3*  ALBUMIN 2.4* 2.3* 2.4* 2.0* 2.1*    Recent Labs  Lab 01/08/22 1739  LIPASE 35    Coagulation Profile: Recent Labs  Lab 01/08/22 1739 01/09/22 0058  INR 1.2 1.5*    Cardiac Enzymes: Recent Labs  Lab 01/09/22 2138 01/10/22 0313 01/11/22 0248 01/12/22 0254 01/13/22 0244  CKTOTAL 20,938* 1,412* 19,896* 13,646* 6,562*     CBG: Recent Labs  Lab 01/08/22 1959 01/08/22 2347  GLUCAP 119* 144*     Recent Results (from the past 240 hour(s))  Urine Culture  Status: None   Collection Time: 01/08/22  5:09 PM   Specimen: In/Out Cath Urine  Result Value Ref Range Status   Specimen Description   Final    IN/OUT CATH URINE Performed at [facility], [address] [place], [place] [address]    Special Requests   Final     NONE Performed at [facility], [address] [place], [place] [address]    Culture   Final    NO GROWTH Performed at [facility], [address] [place], [place] [address]    Report Status 01/10/2022 FINAL  Final  Blood Culture (routine x 2)     Status: None   Collection Time: 01/08/22  5:39 PM   Specimen: BLOOD  Result Value Ref Range Status   Specimen Description   Final    BLOOD RIGHT ANTECUBITAL Performed at [facility], [address] [place], [place] [address]    Special Requests   Final    BOTTLES DRAWN AEROBIC AND ANAEROBIC Blood Culture adequate volume Performed at [facility], [address] [place], [place] [address]    Culture   Final    NO GROWTH 5 DAYS Performed at [facility], [address] [place], [place] [address]    Report Status 01/13/2022 FINAL  Final  Resp Panel by RT-PCR (Flu A&B, Covid) Anterior Nasal Swab     Status: None   Collection Time: 01/08/22  5:42 PM   Specimen: Anterior Nasal Swab  Result Value Ref Range Status   SARS Coronavirus 2 by RT PCR NEGATIVE NEGATIVE Final    Comment: (NOTE) SARS-CoV-2 target nucleic acids are NOT DETECTED.  The SARS-CoV-2 RNA is generally detectable in upper respiratory specimens during the acute phase of infection. The lowest concentration of SARS-CoV-2 viral copies this assay can detect is 138 copies/mL. A negative result does not preclude SARS-Cov-2 infection and should not be used as the sole basis for treatment or other patient management decisions. A negative result may occur with  improper specimen collection/handling, submission of specimen other than nasopharyngeal swab, presence of viral mutation(s) within the areas targeted by this assay, and inadequate number of viral copies(<138 copies/mL). A negative result must be combined with clinical observations, patient history, and epidemiological information. The expected result  is Negative.  Fact Sheet for Patients:  BloggerCourse.comhttps://www.fda.gov/media/152166/download  Fact Sheet for Healthcare Providers:  SeriousBroker.ithttps://www.fda.gov/media/152162/download  This test is no t yet approved or cleared by the [place] FDA and  has been authorized for detection and/or diagnosis of SARS-CoV-2 by FDA under an Emergency Use Authorization (EUA). This EUA will remain  in effect (meaning this test can be used) for the duration of the COVID-19 declaration under Section 564(b)(1) of the Act, 21 U.S.C.section 360bbb-3(b)(1), unless the authorization is terminated  or revoked sooner.       Influenza A by PCR NEGATIVE NEGATIVE Final   Influenza B by PCR NEGATIVE NEGATIVE Final    Comment: (NOTE) The Xpert Xpress SARS-CoV-2/FLU/RSV plus assay is intended as an aid in the diagnosis of influenza from Nasopharyngeal swab specimens and should not be used as a sole basis for treatment. Nasal washings and aspirates are unacceptable for Xpert Xpress SARS-CoV-2/FLU/RSV testing.  Fact Sheet for Patients: BloggerCourse.comhttps://www.fda.gov/media/152166/download  Fact Sheet for Healthcare Providers: SeriousBroker.ithttps://www.fda.gov/media/152162/download  This test is not yet approved or cleared by the [place] FDA and has been authorized for detection and/or diagnosis of SARS-CoV-2 by FDA under an  Emergency Use Authorization (EUA). This EUA will remain in effect (meaning this test can be used) for the duration of the COVID-19 declaration under Section 564(b)(1) of the Act, 21 U.S.C. section 360bbb-3(b)(1), unless the authorization is terminated or revoked.  Performed at [facility], [address] [place], [place] [address]   Blood Culture (routine x 2)     Status: None   Collection Time: 01/08/22  6:00 PM   Specimen: BLOOD  Result Value Ref Range Status   Specimen Description   Final    BLOOD LEFT ANTECUBITAL Performed at [facility], [address],  [place], [place] [address]    Special Requests   Final    BOTTLES DRAWN AEROBIC AND ANAEROBIC Blood Culture adequate volume Performed at [facility], [address], [place], [place] [address]    Culture   Final    NO GROWTH 5 DAYS Performed at [facility], [address], [place], [place] [address]    Report Status 01/13/2022 FINAL  Final  MRSA Next Gen by PCR, Nasal     Status: None   Collection Time: 01/09/22  2:53 AM   Specimen: Nasal Mucosa; Nasal Swab  Result Value Ref Range Status   MRSA by PCR Next Gen NOT DETECTED NOT DETECTED Final    Comment: (NOTE) The GeneXpert MRSA Assay (FDA approved for NASAL specimens only), is one component of a comprehensive MRSA colonization surveillance program. It is not intended to diagnose MRSA infection nor to guide or monitor treatment for MRSA infections. Test performance is not FDA approved in patients less than 6 years old. Performed at [facility], [address] [place], [place] [address]       Radiology Studies: No results found.     LOS: 5 days   Sharri Loya [employer] on [email or web address]  01/13/2022, 11:18 AM

## 2022-01-13 NOTE — Anesthesia Preprocedure Evaluation (Signed)
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,  AST 1085 ALT 293    Endo/Other   Na 131 K 4.9 Ca 7   Renal/GU Renal Insufficiency and ARFRenal diseaseARF 2/2 rhabdo, Cr down to 2.03 this AM, K 3.0  negative genitourinary   Musculoskeletal  (+) narcotic dependentRUE compartment syndrome s/p fasciotomies   Abdominal   Peds  Hematology Hb 11   Anesthesia Other Findings   Reproductive/Obstetrics negative OB ROS                             Anesthesia Physical Anesthesia Plan  ASA: 3  Anesthesia Plan: General   Post-op Pain Management: Dilaudid IV and Ketamine IV*   Induction: Intravenous  PONV Risk Score and Plan: Ondansetron, Dexamethasone, Midazolam and Treatment may vary due to age or medical condition  Airway Management Planned: LMA  Additional Equipment: None  Intra-op Plan:   Post-operative Plan: Extubation in OR  Informed Consent:   Plan Discussed with:   Anesthesia Plan Comments:         Anesthesia Quick Evaluation

## 2022-01-13 NOTE — H&P (View-Only) (Signed)
   Subjective:  No acute events overnight.  Resting comfortably.  Pain well controlled currently.   Objective:   VITALS:   Vitals:   01/13/22 1200 01/13/22 1300 01/13/22 1400 01/13/22 1522  BP: (!) 151/87 (!) 167/84 130/79 (!) 151/86  Pulse: 71 79 89 78  Resp: 16 14 11 18  Temp: 98.3 F (36.8 C)   98.8 F (37.1 C)  TempSrc: Oral   Oral  SpO2: 95% 97% 92% 98%  Weight:    70.8 kg  Height:    5' 9" (1.753 m)    Gen: NAD, resting comfortably Pulm: Normal WOB on RA CV: Normal rate, BUE warm and well perfused RUE: Dressing clean and intact, WVAC intact w/ good seal, hand remains swollen w/ blistering of small finger, minimal sensation to light tough, no active flexion/extension of fingers, hand warm and well perfused w/ BCR    Lab Results  Component Value Date   WBC 20.6 (H) 01/13/2022   HGB 11.0 (L) 01/13/2022   HCT 31.6 (L) 01/13/2022   MCV 88.3 01/13/2022   PLT 205 01/13/2022     Assessment/Plan:  30 yo M s/p right forearm and hand fasciotomies for compartment syndrome.  Has undergone extensive debridement of volar forearm muscle for nonviable/noncontractile muscle tissue.    - Return to OR today for repeat I&D - Have spoken with plastic surgery regarding patient and need for volar wound coverage given swelling and fasciotomy wound; will take pictures this evening for the chart - Will continue WVAC postoperatively - Pt should continue to keep RUE elevated to improve pain and swelling   Christen Bedoya Ali Mohl, MD 01/13/2022, 3:59 PM [phone]  

## 2022-01-13 NOTE — Progress Notes (Signed)
   Subjective:  No acute events overnight.  Resting comfortably.  Pain well controlled currently.   Objective:   VITALS:   Vitals:   01/13/22 1200 01/13/22 1300 01/13/22 1400 01/13/22 1522  BP: (!) 151/87 (!) 167/84 130/79 (!) 151/86  Pulse: 71 79 89 78  Resp: 16 14 11 18   Temp: 98.3 F (36.8 C)   98.8 F (37.1 C)  TempSrc: Oral   Oral  SpO2: 95% 97% 92% 98%  Weight:    70.8 kg  Height:    5\' 9"  (1.753 m)    Gen: NAD, resting comfortably Pulm: Normal WOB on RA CV: Normal rate, BUE warm and well perfused RUE: Dressing clean and intact, WVAC intact w/ good seal, hand remains swollen w/ blistering of small finger, minimal sensation to light tough, no active flexion/extension of fingers, hand warm and well perfused w/ BCR    Lab Results  Component Value Date   WBC 20.6 (H) 01/13/2022   HGB 11.0 (L) 01/13/2022   HCT 31.6 (L) 01/13/2022   MCV 88.3 01/13/2022   PLT 205 01/13/2022     Assessment/Plan:  31 yo M s/p right forearm and hand fasciotomies for compartment syndrome.  Has undergone extensive debridement of volar forearm muscle for nonviable/noncontractile muscle tissue.    - Return to OR today for repeat I&D - Have spoken with plastic surgery regarding patient and need for volar wound coverage given swelling and fasciotomy wound; will take pictures this evening for the chart - Will continue [facility] postoperatively - Pt should continue to keep RUE elevated to improve pain and swelling   Sherilyn Cooter, MD 01/13/2022, 3:59 PM [phone]

## 2022-01-13 NOTE — Progress Notes (Signed)
Day of Surgery  Subjective:  Pt doing well today, no new complaints Difficulty moving fingers of right hand  Objective: Vital signs in last 24 hours: Temp:  [97.8 F (36.6 C)-98.8 F (37.1 C)] 98.8 F (37.1 C) (09/27 1522) Pulse Rate:  [63-98] 78 (09/27 1522) Resp:  [11-20] 18 (09/27 1522) BP: (130-167)/(62-87) 151/86 (09/27 1522) SpO2:  [92 %-100 %] 98 % (09/27 1522) Weight:  [70.8 kg] 70.8 kg (09/27 1522) Last BM Date :  (PTA)  Intake/Output from previous day: 09/26 0701 - 09/27 0700 In: 926 [I.V.:726; IV Piggyback:200] Out: 6200 [Urine:6200] Intake/Output this shift: Total I/O In: 117.4 [I.V.:38.2; IV Piggyback:79.2] Out: 2350 [Urine:2000; Drains:350]  Resting in exam bed in no acute distress Right upper extremity wrapped Right fifth digit with blister, sensation intact, unable to move the fingers, cap refill intact  Lab Results:     Latest Ref Rng & Units 01/13/2022    2:44 AM 01/12/2022    2:54 AM 01/11/2022    2:48 AM  CBC  WBC 4.0 - 10.5 K/uL 20.6  20.3  21.3   Hemoglobin 13.0 - 17.0 g/dL 11.0  11.1  13.2   Hematocrit 39.0 - 52.0 % 31.6  31.8  37.3   Platelets 150 - 400 K/uL 205  174  198     BMET Recent Labs    01/12/22 0254 01/13/22 0244  NA 133* 134*  K 3.7 3.0*  CL 103 99  CO2 23 27  GLUCOSE 176* 120*  BUN 43* 50*  CREATININE 2.08* 2.03*  CALCIUM 7.0* 7.4*   PT/INR No results for input(s): "LABPROT", "INR" in the last 72 hours. ABG No results for input(s): "PHART", "HCO3" in the last 72 hours.  Invalid input(s): "PCO2", "PO2"  Studies/Results: No results found.  Anti-infectives: Anti-infectives (From admission, onward)    Start     Dose/Rate Route Frequency Ordered Stop   01/11/22 2300  [MAR Hold]  ceFEPIme (MAXIPIME) 2 g in sodium chloride 0.9 % 100 mL IVPB        (MAR Hold since Wed 01/13/2022 at 1512.Hold Reason: Transfer to a Procedural area)   2 g 200 mL/hr over 30 Minutes Intravenous Every 12 hours 01/11/22 1335     01/09/22 1600   vancomycin (VANCOREADY) IVPB 1500 mg/300 mL  Status:  Discontinued        1,500 mg 150 mL/hr over 120 Minutes Intravenous Every 24 hours 01/09/22 0043 01/10/22 1304   01/09/22 0200  ceFEPIme (MAXIPIME) 2 g in sodium chloride 0.9 % 100 mL IVPB  Status:  Discontinued        2 g 200 mL/hr over 30 Minutes Intravenous Every 8 hours 01/09/22 0043 01/11/22 1335   01/08/22 1715  ceFEPIme (MAXIPIME) 2 g in sodium chloride 0.9 % 100 mL IVPB        2 g 200 mL/hr over 30 Minutes Intravenous  Once 01/08/22 1713 01/08/22 1819   01/08/22 1715  metroNIDAZOLE (FLAGYL) IVPB 500 mg        500 mg 100 mL/hr over 60 Minutes Intravenous  Once 01/08/22 1713 01/08/22 1833   01/08/22 1715  vancomycin (VANCOCIN) IVPB 1000 mg/200 mL premix        1,000 mg 200 mL/hr over 60 Minutes Intravenous  Once 01/08/22 1713 01/08/22 2019       Assessment/Plan: s/p Procedure(s): IRRIGATION AND DEBRIDEMENT EXTREMITY  We will continue to follow this patient, at this time awaiting intraoperative photos.  Please continue wound care per orthopedics.   LOS:  5 days    Chris Parker, [place] 01/13/2022

## 2022-01-13 NOTE — Anesthesia Postprocedure Evaluation (Signed)
Anesthesia Post Note  Patient: Chris Parker  Procedure(s) Performed: IRRIGATION AND DEBRIDEMENT forearm (Right: Arm Lower)     Patient location during evaluation: PACU Anesthesia Type: General Level of consciousness: awake and alert, oriented and patient cooperative Pain management: pain level controlled Vital Signs Assessment: post-procedure vital signs reviewed and stable Respiratory status: spontaneous breathing, nonlabored ventilation and respiratory function stable Cardiovascular status: blood pressure returned to baseline and stable Postop Assessment: no apparent nausea or vomiting Anesthetic complications: no   No notable events documented.  Last Vitals:  Vitals:   01/13/22 1815 01/13/22 1830  BP: (!) 152/91 (!) 154/89  Pulse: 82 81  Resp: 12 13  Temp:    SpO2: 99% 100%    Last Pain:  Vitals:   01/13/22 1830  TempSrc:   PainSc: [name]

## 2022-01-13 NOTE — Anesthesia Procedure Notes (Signed)
Procedure Name: LMA Insertion Date/Time: 01/13/2022 4:08 PM  Performed by: Gerald Leitz, CRNAPre-anesthesia Checklist: Patient identified, Patient being monitored, Timeout performed, Emergency Drugs available and Suction available Patient Re-evaluated:Patient Re-evaluated prior to induction Oxygen Delivery Method: Circle system utilized Preoxygenation: Pre-oxygenation with 100% oxygen Induction Type: IV induction Ventilation: Mask ventilation without difficulty LMA: LMA inserted LMA Size: 4.0 Tube type: Oral Number of attempts: 1 Placement Confirmation: positive ETCO2 and breath sounds checked- equal and bilateral Tube secured with: Tape Dental Injury: Teeth and Oropharynx as per pre-operative assessment

## 2022-01-13 NOTE — Op Note (Signed)
Date of Surgery: 01/11/2022  INDICATIONS: Patient is a 31 y.o.-year-old male with right forearm and hand compartment syndrome after being down for unknown length of time.  He underwent forearm and hand fasciotomies emergently and presented today for I&D with possible closure versus repeat wound vac. Risks, benefits, and alternatives to surgery were again discussed with the patient in the preoperative area. The patient wishes to proceed with surgery.  Informed consent was signed after our discussion.   PREOPERATIVE DIAGNOSIS:  Right forearm compartment syndrome Right hand compartment syndrome  POSTOPERATIVE DIAGNOSIS: Same.  PROCEDURE:   Irrigation and excisional debridement of right volar forearm muscle and fascia (wound 9x30 cm) Irrigation and excisional debridement of right dorsal forearm muscle and fascia (6E95 cm) Application of wound vac (9x30 cm)   SURGEON: Audria Nine, M.D.  ASSIST:   ANESTHESIA:  general  IV FLUIDS AND URINE: See anesthesia.  ESTIMATED BLOOD LOSS: 20 mL.  IMPLANTS: * No implants in log *   DRAINS: Incisional [place]  COMPLICATIONS: Arterial injury  DESCRIPTION OF PROCEDURE: The patient was met in the preoperative holding area where the surgical site was marked and the consent form was signed.  The patient was then taken to the operating room and transferred to the operating table.  All bony prominences were well padded.  A tourniquet was applied to the right upper arm.  General endotracheal anesthesia was induced.  The operative extremity was prepped and draped in the usual and sterile fashion.  A formal time-out was performed to confirm that this was the correct patient, surgery, side, and site.   Following formal timeout, the previous sutures in the volar and dorsal forearm were removed.  The volar forearm wound was examined.  There was significant edema fluid within the wound bed.  The proximal half of the muscle bellies were healthy appearing and  contracted with the Bovie.  The muscle tissue in the distal half of the wound bed, however, was nonviable appearing and noncontractile.  No the muscle was frankly ischemic appearing but was noncontractile and quite soft.  Excisional debridement was performed using a scissor and rondure to debride all necrotic or nonviable tissue.  The median and ulnar nerves were protected.  During the course of the debridement, an arteriotomy was made inadvertently in the proximal radial artery.  The tourniquet was applied and a repair was performed using a 6-0 Prolene suture.  At the end the procedure, however, the repair appeared to have thrombosed.  The hand remained warm and well-perfused with brisk capillary refill.  A Doppler was used to evaluate the ulnar artery, the palmar arch, and the inner interosseous artery.  The ulnar artery was intact with excellent Doppler signal as well as the palmar arch and the interosseous artery.  Following thorough debridement of the volar compartment, I turned my attention to the dorsal compartment.  There were similar findings in the muscle.  There were some areas of ischemic appearing muscle as well as some areas of viable contractile muscle.  All nonviable appearing muscle was sharply excised using a scissor and a rondure.  Following thorough debridement, both wounds were thoroughly irrigated with copious sterile saline via low flow cystoscopy tubing.  The dorsal wound was able to be closed using a running 2-0 nylon suture.  The proximal one third of the volar wound was closed primarily using a nylon suture.  The remainder of the wound was covered with a wound VAC.  The hypothenar and thenar incisions were then opened and  any nonviable muscle was again sharply debrided using a scissor and rondure.  These were closed primarily using a 2-0 nylon suture.  The dorsal wound was dressed with Adaptic nonadherent dressing and a folded Kerlix.  A similar dressing was applied to the volar aspect of  the hand.  This was followed by an Ace wrap.  The wound VAC was intact with adequate seal at the end of the procedure.  The end of the procedure, the patient's hand remained warm and well-perfused with brisk capillary refill in all fingers.  The patient was reversed from anesthesia and extubated uneventfully.  They were transferred from the operating table to the postoperative bed.  All counts were correct x 2 at the end of the procedure.  The patient was then taken to the PACU in stable condition.   POSTOPERATIVE PLAN: Return to his ICU stepdown room.  His electrolyte arrangements are still being corrected.  His acute kidney injury is being monitored.  We will plan for repeat I&D on Wednesday with possible repeat wound VAC versus closure.  Audria Nine, MD 5:54 PM

## 2022-01-14 ENCOUNTER — Encounter (HOSPITAL_COMMUNITY): Payer: Self-pay | Admitting: Orthopedic Surgery

## 2022-01-14 DIAGNOSIS — R7401 Elevation of levels of liver transaminase levels: Secondary | ICD-10-CM | POA: Diagnosis not present

## 2022-01-14 DIAGNOSIS — M6282 Rhabdomyolysis: Secondary | ICD-10-CM | POA: Diagnosis not present

## 2022-01-14 DIAGNOSIS — N179 Acute kidney failure, unspecified: Secondary | ICD-10-CM | POA: Diagnosis not present

## 2022-01-14 DIAGNOSIS — R778 Other specified abnormalities of plasma proteins: Secondary | ICD-10-CM | POA: Diagnosis not present

## 2022-01-14 LAB — COMPREHENSIVE METABOLIC PANEL
ALT: 160 U/L — ABNORMAL HIGH (ref 0–44)
AST: 207 U/L — ABNORMAL HIGH (ref 15–41)
Albumin: 2.2 g/dL — ABNORMAL LOW (ref 3.5–5.0)
Alkaline Phosphatase: 79 U/L (ref 38–126)
Anion gap: 6 (ref 5–15)
BUN: 49 mg/dL — ABNORMAL HIGH (ref 6–20)
CO2: 26 mmol/L (ref 22–32)
Calcium: 7.5 mg/dL — ABNORMAL LOW (ref 8.9–10.3)
Chloride: 99 mmol/L (ref 98–111)
Creatinine, Ser: 1.95 mg/dL — ABNORMAL HIGH (ref 0.61–1.24)
GFR, Estimated: 47 mL/min — ABNORMAL LOW (ref >60)
Glucose, Bld: 175 mg/dL — ABNORMAL HIGH (ref 70–99)
Potassium: 3.7 mmol/L (ref 3.5–5.1)
Sodium: 131 mmol/L — ABNORMAL LOW (ref 135–145)
Total Bilirubin: 0.9 mg/dL (ref 0.3–1.2)
Total Protein: 5.3 g/dL — ABNORMAL LOW (ref 6.5–8.1)

## 2022-01-14 LAB — CBC
HCT: 29.9 % — ABNORMAL LOW (ref 39.0–52.0)
Hemoglobin: 10.7 g/dL — ABNORMAL LOW (ref 13.0–17.0)
MCH: 31.1 pg (ref 26.0–34.0)
MCHC: 35.8 g/dL (ref 30.0–36.0)
MCV: 86.9 fL (ref 80.0–100.0)
Platelets: 230 10*3/uL (ref 150–400)
RBC: 3.44 MIL/uL — ABNORMAL LOW (ref 4.22–5.81)
RDW: 12.6 % (ref 11.5–15.5)
WBC: 19.4 10*3/uL — ABNORMAL HIGH (ref 4.0–10.5)
nRBC: 0 % (ref 0.0–0.2)

## 2022-01-14 LAB — CK: Total CK: 6033 U/L — ABNORMAL HIGH (ref 49–397)

## 2022-01-14 MED ORDER — POLYETHYLENE GLYCOL 3350 17 G PO PACK
17.0000 g | PACK | Freq: Every day | ORAL | Status: DC
  Administered 2022-01-14 – 2022-01-22 (×9): 17 g via ORAL
  Filled 2022-01-14 (×9): qty 1

## 2022-01-14 MED ORDER — SENNOSIDES-DOCUSATE SODIUM 8.6-50 MG PO TABS
1.0000 | ORAL_TABLET | Freq: Two times a day (BID) | ORAL | Status: DC
  Administered 2022-01-14 – 2022-01-22 (×15): 1 via ORAL
  Filled 2022-01-14 (×17): qty 1

## 2022-01-14 MED ORDER — MELATONIN 3 MG PO TABS
3.0000 mg | ORAL_TABLET | Freq: Every day | ORAL | Status: DC
  Administered 2022-01-14 – 2022-01-22 (×9): 3 mg via ORAL
  Filled 2022-01-14 (×9): qty 1

## 2022-01-14 NOTE — Evaluation (Signed)
Physical Therapy Evaluation Patient Details Name: Chris Parker MRN: [id number] DOB: [birth year] Today's Date: 01/14/2022  History of Present Illness  30-yom w/ history of IVDA, found down on the ground on his right side for unclear duration of time,reportedly with needle sticking out of right arm. In ED- in severe rhabdomyolysis w/ compartment syndrome  Rt UE  s/p OR by Dr. Frazier Butt- emergent fasciotomy RUE 01/09/22, s/p I&D 9/25 and 9/27.  Clinical Impression  Pt admitted with above diagnosis. Min assist to pivot from bed to chair holding IV pole with LUE. Pt unable to ambulate due to pain and weakness in LLE, pt also reports numbness L foot. Pt noted to have weak R ankle dorsiflexion. Encouraged pt to perform R ankle DF AROM and R knee extension AROM for strengthening.  Pt currently with functional limitations due to the deficits listed below (see PT Problem List). Pt will benefit from skilled PT to increase their independence and safety with mobility to allow discharge to the venue listed below.          Recommendations for follow up therapy are one component of a multi-disciplinary discharge planning process, led by the attending physician.  Recommendations may be updated based on patient status, additional functional criteria and insurance authorization.  Follow Up Recommendations Outpatient PT      Assistance Recommended at Discharge Intermittent Supervision/Assistance  Patient can return home with the following  A little help with walking and/or transfers;A little help with bathing/dressing/bathroom;Assistance with cooking/housework;Direct supervision/assist for medications management;Assist for transportation;Help with stairs or ramp for entrance    Equipment Recommendations Cane  Recommendations for Other Services       Functional Status Assessment Patient has had a recent decline in their functional status and demonstrates the ability to make significant improvements in function in  a reasonable and predictable amount of time.     Precautions / Restrictions Precautions Precautions: Fall Precaution Comments: no falls in past 6 months; wound VAC R forearm Restrictions Weight Bearing Restrictions: No Other Position/Activity Restrictions: assuming NWB RUE      Mobility  Bed Mobility Overal bed mobility: Needs Assistance Bed Mobility: Supine to Sit     Supine to sit: Min assist, HOB elevated     General bed mobility comments: increased time/effort, used rail, HOB up    Transfers Overall transfer level: Needs assistance Equipment used:  (IV pole) Transfers: Sit to/from Stand, Bed to chair/wheelchair/BSC     Step pivot transfers: Min guard       General transfer comment: Pt held IV pole in LUE, able to take a few pivotal steps to recliner, unable to fully WB on RLE 2* pain/numbness/weakness    Ambulation/Gait                  Stairs            Wheelchair Mobility    Modified Rankin (Stroke Patients Only)       Balance Overall balance assessment: Needs assistance Sitting-balance support: Feet supported, No upper extremity supported Sitting balance-Leahy Scale: Good     Standing balance support: During functional activity, Reliant on assistive device for balance, Single extremity supported Standing balance-Leahy Scale: Fair Standing balance comment: relies on UE support for transfer                             Pertinent Vitals/Pain Pain Assessment Pain Assessment: Faces Pain Score: 5  Pain Location: R forearm Pain Descriptors /  Indicators: Sore, Tender Pain Intervention(s): Limited activity within patient's tolerance, Monitored during session, Repositioned, Premedicated before session    [place] expects to be discharged to:: Private residence Living Arrangements: Parent Available Help at Discharge: Family;Available 24 hours/day           Home Layout: Two level;Able to live on main  level with bedroom/bathroom Home Equipment: None      Prior Function Prior Level of Function : Independent/Modified Independent             Mobility Comments: independent ADLs Comments: independent     Hand Dominance        Extremity/Trunk Assessment   Upper Extremity Assessment Upper Extremity Assessment: Defer to OT evaluation;RUE deficits/detail RUE Deficits / Details: pt able to fully elevate shoulder, stated he can't wiggle fingers 2* pain; wound VAC R forearm    Lower Extremity Assessment Lower Extremity Assessment: RLE deficits/detail RLE Deficits / Details: reports tingling R foot, ankle DF +2/5, ankle DF AROM decr 50%, knee ext 4/5 RLE Sensation: decreased light touch    Cervical / Trunk Assessment Cervical / Trunk Assessment: Normal  Communication   Communication: No difficulties  Cognition Arousal/Alertness: Awake/alert Behavior During Therapy: WFL for tasks assessed/performed Overall Cognitive Status: Within Functional Limits for tasks assessed                                          General Comments      Exercises General Exercises - Upper Extremity Shoulder Flexion: AROM, Right, 5 reps, Seated General Exercises - Lower Extremity Ankle Circles/Pumps: AROM, Right, 10 reps, Seated Long Arc Quad: AROM, Right, 10 reps, Seated   Assessment/Plan    PT Assessment Patient needs continued PT services  PT Problem List Decreased strength;Decreased mobility;Decreased range of motion;Decreased activity tolerance;Decreased balance;Pain       PT Treatment Interventions Gait training;Therapeutic exercise;Therapeutic activities;Functional mobility training;Balance training;Patient/family education;DME instruction    PT Goals (Current goals can be found in the Care Plan section)  Acute Rehab PT Goals Patient Stated Goal: return to independence PT Goal Formulation: With patient/family Time For Goal Achievement: 01/28/22 Potential to  Achieve Goals: Good    Frequency Min 3X/week     Co-evaluation               AM-PAC PT "6 Clicks" Mobility  Outcome Measure Help needed turning from your back to your side while in a flat bed without using bedrails?: A Little Help needed moving from lying on your back to sitting on the side of a flat bed without using bedrails?: A Little Help needed moving to and from a bed to a chair (including a wheelchair)?: A Little Help needed standing up from a chair using your arms (e.g., wheelchair or bedside chair)?: A Little Help needed to walk in hospital room?: A Lot Help needed climbing 3-5 steps with a railing? : Total 6 Click Score: 15    End of Session Equipment Utilized During Treatment: Gait belt Activity Tolerance: Patient tolerated treatment well Patient left: in chair;with call bell/phone within reach;with family/visitor present (chair alarm pad under pt, no alarm box in room, RN notified) Nurse Communication: Mobility status PT Visit Diagnosis: Difficulty in walking, not elsewhere classified (R26.2);Pain;Muscle weakness (generalized) (M62.81);Other abnormalities of gait and mobility (R26.89);Unsteadiness on feet (R26.81) Pain - Right/Left: Right Pain - part of body: Ankle and joints of foot  Time: 4496-7591 PT Time Calculation (min) (ACUTE ONLY): 14 min   Charges:   PT Evaluation $PT Eval Moderate Complexity: 1 Mod          Tamala Ser PT 01/14/2022  Acute Rehabilitation Services  Office [phone]

## 2022-01-14 NOTE — Evaluation (Signed)
Occupational Therapy Evaluation Patient Details Name: Chris Parker MRN: [id number] DOB: Mar 22, 1991 Today's Date: 01/14/2022   History of Present Illness 31 year old male found down on the ground on his right side for unclear duration of time.  He was found to have R UE compartment syndrome and is s/p an emergent R UE fasciotomy on 01/09/22, and  I&D on 01/11/22 and 01/13/22.   Clinical Impression   Pt is currently presenting with acute R UE and R LE numbness with tingling, R ankle weakness, R LE edema, R UE ROM, strength, and coordination deficits, mild lightheadedness with progressive activity, impaired standing balance, and impaired ADL performance. He had difficulty fully lifting his R LE off the floor in standing, due to weakness and associated feelings of heaviness.  Overall, he is presenting below his baseline level of functioning for ADL management, as he requires assistance with self-care tasks. He will benefit from further OT services to maximize his independence with self-care tasks and to decrease the risk for restricted participation in meaningful activities.      Recommendations for follow up therapy are one component of a multi-disciplinary discharge planning process, led by the attending physician.  Recommendations may be updated based on patient status, additional functional criteria and insurance authorization.   Follow Up Recommendations  Home health OT vs. Outpatient OT, pending functional progress   Assistance Recommended at Discharge Intermittent Supervision/Assistance  Patient can return home with the following A little help with walking and/or transfers;Assistance with cooking/housework;Assist for transportation;Help with stairs or ramp for entrance;A little help with bathing/dressing/bathroom    Functional Status Assessment  Patient has had a recent decline in their functional status and demonstrates the ability to make significant improvements in function in a reasonable  and predictable amount of time.  Equipment Recommendations  None recommended by OT       Precautions / Restrictions Precautions Precautions: Fall Restrictions Weight Bearing Restrictions: No Other Position/Activity Restrictions: presumed NWB RUE, wound vac      Mobility Bed Mobility Overal bed mobility: Needs Assistance Bed Mobility: Supine to Sit     Supine to sit: Min guard     General bed mobility comments: increased time/effort, used rail, HOB up    Transfers Overall transfer level: Needs assistance   Transfers: Sit to/from Stand Sit to Stand: Min assist          Balance     Sitting balance-Leahy Scale: Good       Standing balance-Leahy Scale: Fair           ADL either performed or assessed with clinical judgement   ADL   Eating/Feeding: Set up Eating/Feeding Details (indicate cue type and reason): requires assist to open packets, remove lids, and cut food, due to acute R UE ROM and strength deficits; requires use of non-dominant L UE Grooming: Minimal assistance Grooming Details (indicate cue type and reason): based on clinical judgement         Upper Body Dressing : Moderate assistance   Lower Body Dressing: Moderate assistance                       Vision    He correctly read the time depicted on the wall clock              Pertinent Vitals/Pain Pain Assessment Pain Assessment: No/denies pain     Hand Dominance Right   Extremity/Trunk Assessment Upper Extremity Assessment Upper Extremity Assessment: Defer to OT evaluation;RUE deficits/detail RUE Deficits /  Details: pt able to fully elevate shoulder, limited ability to flex or extend R elbow due to reports of discomfort, no active movement of fingertips noted. L UE AROM and strength WFL RUE Sensation: decreased light touch   Lower Extremity Assessment Lower Extremity Assessment: RLE deficits/detail RLE Deficits / Details: ankle weakness noted. L LE AROM and strength  WFL RLE Sensation: decreased light touch   Cervical / Trunk Assessment Cervical / Trunk Assessment: Normal   Communication Communication Communication: No difficulties   Cognition Arousal/Alertness: Awake/alert Behavior During Therapy: WFL for tasks assessed/performed Overall Cognitive Status: Within Functional Limits for tasks assessed          General Comments: Oriented x4, able to follow commands without difficulty     General Comments   Pt reports having acute partial R LE numbness and tingling, as well as acute numbness from his R elbow down to fingertips.             Home Living Family/patient expects to be discharged to:: Private residence Living Arrangements: Parent Available Help at Discharge: Family;Available 24 hours/day         Home Layout: Two level (His bedroom is upstairs)         Biochemist, clinical: Handicapped height     Home Equipment: Wheelchair - manual;Tub bench          Prior Functioning/Environment Prior Level of Function : Independent/Modified Independent             Mobility Comments: independent ADLs Comments: independent        OT Problem List: Decreased strength;Decreased range of motion;Impaired balance (sitting and/or standing);Decreased coordination;Decreased knowledge of use of DME or AE;Impaired sensation;Impaired UE functional use;Increased edema      OT Treatment/Interventions: Self-care/ADL training;Therapeutic exercise;Therapeutic activities;Energy conservation;DME and/or AE instruction;Patient/family education;Balance training    OT Goals(Current goals can be found in the care plan section) Acute Rehab OT Goals OT Goal Formulation: With patient Time For Goal Achievement: 01/28/22 Potential to Achieve Goals: Good ADL Goals Pt Will Perform Eating: with modified independence Pt Will Perform Grooming: with modified independence;standing Pt Will Perform Upper Body Dressing: with modified independence;sitting Pt  Will Perform Lower Body Dressing: with modified independence;sit to/from stand Pt Will Transfer to Toilet: with modified independence;ambulating Pt Will Perform Toileting - Clothing Manipulation and hygiene: with modified independence;sit to/from stand  OT Frequency: Min 2X/week       AM-PAC OT "6 Clicks" Daily Activity     Outcome Measure Help from another person eating meals?: A Little Help from another person taking care of personal grooming?: A Little Help from another person toileting, which includes using toliet, bedpan, or urinal?: A Little Help from another person bathing (including washing, rinsing, drying)?: A Little Help from another person to put on and taking off regular upper body clothing?: A Lot Help from another person to put on and taking off regular lower body clothing?: A Lot 6 Click Score: 16   End of Session    Activity Tolerance: Patient tolerated treatment well Patient left: in bed;with call bell/phone within reach;with family/visitor present  OT Visit Diagnosis: Unsteadiness on feet (R26.81);Muscle weakness (generalized) (M62.81)                Time: 8469-6295 OT Time Calculation (min): 16 min Charges:  OT General Charges $OT Visit: 1 Visit OT Evaluation $OT Eval Moderate Complexity: 1 Mod    Aury Scollard L Lanique Gonzalo, OTR/L 01/14/2022, 3:56 PM

## 2022-01-14 NOTE — Progress Notes (Signed)
   Subjective:  No acute events overnight.  Resting comfortably this AM.  Pain well controlled.   Objective:   VITALS:   Vitals:   01/14/22 0500 01/14/22 0600 01/14/22 0700 01/14/22 0800  BP: (!) 157/87 (!) 147/78 (!) 148/80 (!) 151/77  Pulse: 75 72 73 73  Resp: 15 16 17 18   Temp:    99.5 F (37.5 C)  TempSrc:    Axillary  SpO2: 98% 98% 98% 98%  Weight:      Height:        Gen: Resting comfortably in bed Pulm: Normal WOB on RA CV: BUE warm and well perfused, normal rate RUE: Dressing clean and dry, incisional vac intact w/ good seal and 50cc of serous fluid in cannister, no active motor function and minimal to no sensation in fingers, fingers remain warm and well perfused w/ brisk capillary refill.     Lab Results  Component Value Date   WBC 19.4 (H) 01/14/2022   HGB 10.7 (L) 01/14/2022   HCT 29.9 (L) 01/14/2022   MCV 86.9 01/14/2022   PLT 230 01/14/2022     Assessment/Plan:  31 yo M POD 6 s/p right forearm and hand fasciotomies for compartment syndrome after being found down for unknown length of time.  POD 3 s/p initial I&D w/ sharp excision of noncontractile/nonviable muscle w/ application of [place] and POD 1 s/p repeat I&D w/ wound closure.  - Incisional [place] on volar right forearm wound to help w/ edema fluid - Discussed importance of arm and hand elevation to help w/ swelling - Will get OT involved in next few days to work on PROM of fingers and make a resting hand splint in intrinsic plus position to prevent contracture    Sherilyn Cooter, MD 01/14/2022, 10:40 AM [phone]

## 2022-01-14 NOTE — Progress Notes (Addendum)
[facility] PROGRESS NOTE   Kinser Fellman ZOX:[id number] DOB: June 04, 1990 DOA: 01/08/2022  PCP: Patient, No Pcp Per  Brief History/Interval Summary: 31-yom w/ history of IVDA, found down on the ground on his right side for unclear duration of time,reportedly with needle sticking out of right arm. In ED- in severe rhabdomyolysis w/ compartment syndrome Rt UE  s/p OR by Dr. Tempie Donning- emergent fasciotomy RUE, placed on aggressive IVF, cxr-right-sided airspace opacities ?asymmetric edema given that the patient was found on his right sidem procal and CTA ordere  placed on emperic broad-spectrum IV antibiotics with IV Vanco and cefepime. Also with hyperkalemia, s/p IV insulin and Lokelma, AKI creat 1.8, metabolci acidosis, transaminitis, w/ elevated troponin discussed w/ Dr. Debara Pickett, who felt that this elevation was less likely represent acute MI, but more likely on the basis of type II supply/demand mismatch- no heparin added but troponin trended and echocardiogram. ABG w/ 7.29/41/158/21.    9/23: Due to ongoing pain underwent right dorsal forearm compartment release. Placed Foley 9/25: Irrigation and debridement of right upper extremity 9/27: to Undergo repeat debridement on 9/27  Consultants: Plastic surgery.  Orthopedics.  Nephrology.  Procedures:  Emergency fasciotomy right upper extremity.   Multiple debridements to the right upper extremity    Subjective/Interval History: Patient mentions that the pain in the right arm is reasonably well controlled.  Not able to move his fingers.  Improved mobility of the arm and the right lower extremity is noted.  Denies any chest pain shortness of breath nausea or vomiting.     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  S/P emergent fasciotomy of forearm and  hand 9/22 and 9/23 and repeat on 9/25 (Dr. Tempie Donning).  Underwent another I&D on 9/27. Still not able to move the fingers on his right hand but is able to move his arm itself.  Able to move his right lower extremity.  Will benefit from PT and OT evaluation.  CK levels gradually improving.  Noted to be 6000 this morning.  Presented with a CK of greater than 50,000.  Acute kidney injury Due to rhabdomyolysis. No prior labs. Nephrology was consulted to assist with management.   Foley catheter was placed.  Patient has good urine output.  Renal function appears to have stabilized.  Avoid nephrotoxic agents.  Furosemide has been discontinued by nephrology.  They have signed off. CT scan of the abdomen pelvis did not show any hydronephrosis.    Hypocalcemia in the setting of hypoalbuminemia Corrected calcium is stable  Hyponatremia Mild, continue IVF.    Metabolic acidosis w/ lactic acidosis Lactic acid and bicarb level has normalized.    Potassium abnormalities, hyperkalemia followed by hypokalemia Potassium level has improved this morning.     RUE/RLE weakness w/ decreased sensation  CT head no acute findings CT L and T-spine no acute finding.  Most likely weakness from compression neuropathy/rhabdomyolysis from laying on the right side,+/-drug use/unresponsive on floor. Appears to be regaining strength on the right side.  MRI was ordered but was unable to tolerate due to severe spasms and pain.  Despite Ativan this could not be accomplished Since his strength appears to be improving we can hold off on the MRI for now.  PT and OT evaluation.   Right sided pneumonia likely aspiration pneumonia/Sepsis POA: Pro-Cal elevated 49, wbc In 26k-imaging w/ rt sided pneumonia.  Blood  cultures no growth so far.   WBC remains elevated which could partly be due to other acute issues including severe inflammation.  Noted to be afebrile. Vancomycin was discontinued.  Noted to be on cefepime.  Respiratory  status is stable.   We will plan for a 7-day course of antibiotics.  Looks like antibiotics were initiated on 9/22.  Will discontinue after tomorrow evening's dose.  Blood cultures have been negative.   Transaminitis Significantly high AST ALT likely due to rhabdomyolysis.  Improving trends noted.  Hepatitis C also contributing.  No acute abnormalities noted on ultrasound of the right upper quadrant.  Hepatitis C Hep C antibody positive and viral load 275,000.  He will need follow-up with [facility] ID discharge.  This appears to be a new diagnosis.  Elevated troponin  Due to demand ischemia from rhabdomyolysis/compartment syndrome, trops [phone] 1200: Dr. Rennis Golden was discussed-no further recommendation, echo with EF 60 to 65%, no RWMA   IVDA  Admits using fentanyl, urine drug screen negative.    Acute metabolic encephalopathy Likely multifactorial metabolic and toxic in the setting of IVDA, sepsis.  Mentation seems to be back to baseline.   High blood pressure no prior history of hypertension  Likely elevated due to pain.  Will not be too aggressive with blood pressure control due to his renal dysfunction.  Normocytic anemia No evidence for overt bleeding.  Some blood loss with surgery as expected.  Monitor closely.  Check anemia panel.  DVT Prophylaxis: SCDs Code Status: Full code Family Communication: Discussed with patient Disposition Plan: To be determined  Status is: Inpatient Remains inpatient appropriate because: Compartment syndrome, rhabdomyolysis, acute kidney injury      Medications: Scheduled:  calcium carbonate  1 tablet Oral TID   Chlorhexidine Gluconate Cloth  6 each Topical Daily   melatonin  3 mg Oral QHS   polyethylene glycol  17 g Oral Daily   senna-docusate  1 tablet Oral BID   Continuous:  sodium chloride Stopped (01/13/22 0950)   ceFEPime (MAXIPIME) IV Stopped (01/13/22 2337)   WJX:BJYNWG chloride, acetaminophen **OR** acetaminophen, hydrALAZINE,  HYDROmorphone (DILAUDID) injection, LORazepam, naLOXone (NARCAN)  injection, ondansetron (ZOFRAN) IV, mouth rinse, oxyCODONE  Antibiotics: Anti-infectives (From admission, onward)    Start     Dose/Rate Route Frequency Ordered Stop   01/11/22 2300  ceFEPIme (MAXIPIME) 2 g in sodium chloride 0.9 % 100 mL IVPB        2 g 200 mL/hr over 30 Minutes Intravenous Every 12 hours 01/11/22 1335     01/09/22 1600  vancomycin (VANCOREADY) IVPB 1500 mg/300 mL  Status:  Discontinued        1,500 mg 150 mL/hr over 120 Minutes Intravenous Every 24 hours 01/09/22 0043 01/10/22 1304   01/09/22 0200  ceFEPIme (MAXIPIME) 2 g in sodium chloride 0.9 % 100 mL IVPB  Status:  Discontinued        2 g 200 mL/hr over 30 Minutes Intravenous Every 8 hours 01/09/22 0043 01/11/22 1335   01/08/22 1715  ceFEPIme (MAXIPIME) 2 g in sodium chloride 0.9 % 100 mL IVPB        2 g 200 mL/hr over 30 Minutes Intravenous  Once 01/08/22 1713 01/08/22 1819   01/08/22 1715  metroNIDAZOLE (FLAGYL) IVPB 500 mg        500 mg 100 mL/hr over 60 Minutes Intravenous  Once 01/08/22 1713 01/08/22 1833   01/08/22 1715  vancomycin (VANCOCIN) IVPB 1000 mg/200 mL premix  1,000 mg 200 mL/hr over 60 Minutes Intravenous  Once 01/08/22 1713 01/08/22 2019       Objective:  Vital Signs  Vitals:   01/14/22 0500 01/14/22 0600 01/14/22 0700 01/14/22 0800  BP: (!) 157/87 (!) 147/78 (!) 148/80 (!) 151/77  Pulse: 75 72 73 73  Resp: 15 16 17 18   Temp:    99.5 F (37.5 C)  TempSrc:    Axillary  SpO2: 98% 98% 98% 98%  Weight:      Height:        Intake/Output Summary (Last 24 hours) at 01/14/2022 0959 Last data filed at 01/14/2022 0512 Gross per 24 hour  Intake 1049.04 ml  Output 4115 ml  Net -3065.96 ml    Filed Weights   01/09/22 0033 01/10/22 0500 01/13/22 1522  Weight: 78.5 kg 79 kg 70.8 kg    General appearance: Awake alert.  In no distress Resp: Normal effort.  Good air entry bilaterally.  Few crackles at the bases.  No  wheezing or rhonchi. Cardio: S1-S2 is normal regular.  No S3-S4.  No rubs murmurs or bruit GI: Abdomen is soft.  Nontender nondistended.  Bowel sounds are present normal.  No masses organomegaly Extremities: Right covered in dressing.  Able to lift arm.  Not much movement noted in the fingers.  Able to lift his right leg.  Left leg movement appears to be normal. Neurologic: Alert and oriented x3.  No focal neurological deficits.     Lab Results:  Data Reviewed: I have personally reviewed following labs and reports of the imaging studies  CBC: Recent Labs  Lab 01/08/22 1739 01/08/22 2208 01/09/22 0038 01/10/22 0313 01/11/22 0248 01/12/22 0254 01/13/22 0244 01/14/22 0303  WBC 26.2*  --  19.8* 16.5* 21.3* 20.3* 20.6* 19.4*  NEUTROABS 23.6*  --  17.6*  --   --   --   --   --   HGB 19.0*   < > 15.1 11.3* 13.2 11.1* 11.0* 10.7*  HCT 54.9*   < > 44.7 33.0* 37.3* 31.8* 31.6* 29.9*  MCV 89.4  --  90.3 91.2 88.0 88.8 88.3 86.9  PLT 290  --  204 166 198 174 205 230   < > = values in this interval not displayed.     Basic Metabolic Panel: Recent Labs  Lab 01/08/22 1739 01/08/22 2208 01/09/22 0058 01/09/22 0654 01/10/22 01/12/22 01/10/22 1557 01/11/22 0248 01/12/22 0254 01/13/22 0244 01/14/22 0303  NA 134*   < > 132*   < > 134*  --  131* 133* 134* 131*  K 6.1*   < > 5.7*   < > 4.1 3.6 3.3* 3.7 3.0* 3.7  CL 106  --  109   < > 107  --  99 103 99 99  CO2 16*  --  18*   < > 20*  --  23 23 27 26   GLUCOSE 147*  --  150*   < > 177*  --  178* 176* 120* 175*  BUN 42*  --  42*   < > 40*  --  48* 43* 50* 49*  CREATININE 1.83*  --  1.60*   < > 1.85*  --  2.29* 2.08* 2.03* 1.95*  CALCIUM 7.9*  --  6.6*   < > 5.9*  --  7.3* 7.0* 7.4* 7.5*  MG 2.0  --  2.1  --  1.7  --   --   --   --   --   PHOS  --   --  2.7  --  3.5  --   --   --   --   --    < > = values in this interval not displayed.     GFR: Estimated Creatinine Clearance: 56.4 mL/min (A) (by C-G formula based on SCr of 1.95 mg/dL  (H)).  Liver Function Tests: Recent Labs  Lab 01/10/22 0313 01/11/22 0248 01/12/22 0254 01/13/22 0244 01/14/22 0303  AST 728* 498* 331* 258* 207*  ALT 275* 253* 194* 173* 160*  ALKPHOS 82 94 76 72 79  BILITOT 0.7 0.8 0.6 0.5 0.9  PROT 5.3* 5.8* 4.9* 5.3* 5.3*  ALBUMIN 2.3* 2.4* 2.0* 2.1* 2.2*     Recent Labs  Lab 01/08/22 1739  LIPASE 35     Coagulation Profile: Recent Labs  Lab 01/08/22 1739 01/09/22 0058  INR 1.2 1.5*     Cardiac Enzymes: Recent Labs  Lab 01/10/22 0313 01/11/22 0248 01/12/22 0254 01/13/22 0244 01/14/22 0303  CKTOTAL 1,412* 19,896* 13,646* 6,562* 6,033*      CBG: Recent Labs  Lab 01/08/22 1959 01/08/22 2347  GLUCAP 119* 144*      Recent Results (from the past 240 hour(s))  Urine Culture     Status: None   Collection Time: 01/08/22  5:09 PM   Specimen: In/Out Cath Urine  Result Value Ref Range Status   Specimen Description   Final    IN/OUT CATH URINE Performed at [facility], [address] [place], [place] [address]    Special Requests   Final    NONE Performed at [facility], [address] [place], [place] [address]    Culture   Final    NO GROWTH Performed at [facility], [address] [place], [place] [address]    Report Status 01/10/2022 FINAL  Final  Blood Culture (routine x 2)     Status: None   Collection Time: 01/08/22  5:39 PM   Specimen: BLOOD  Result Value Ref Range Status   Specimen Description   Final    BLOOD RIGHT ANTECUBITAL Performed at [facility], [address] [place], [place] [address]    Special Requests   Final    BOTTLES DRAWN AEROBIC AND ANAEROBIC Blood Culture adequate volume Performed at [facility], [address] [place], [place] [address]    Culture   Final    NO GROWTH 5 DAYS Performed at [facility], [address] [place], [place] [address]    Report Status 01/13/2022 FINAL   Final  Resp Panel by RT-PCR (Flu A&B, Covid) Anterior Nasal Swab     Status: None   Collection Time: 01/08/22  5:42 PM   Specimen: Anterior Nasal Swab  Result Value Ref Range Status   SARS Coronavirus 2 by RT PCR NEGATIVE NEGATIVE Final    Comment: (NOTE) SARS-CoV-2 target nucleic acids are NOT DETECTED.  The SARS-CoV-2 RNA is generally detectable in upper respiratory specimens during the acute phase of infection. The lowest concentration of SARS-CoV-2 viral copies this assay can detect is 138 copies/mL. A negative result does not preclude SARS-Cov-2 infection and should not be used as the sole basis for treatment or other patient management decisions. A negative result may occur with  improper specimen collection/handling, submission of specimen other than nasopharyngeal swab, presence of viral mutation(s) within the areas targeted by this assay, and inadequate number of viral copies(<138 copies/mL). A negative result must be combined with clinical observations,  patient history, and epidemiological information. The expected result is Negative.  Fact Sheet for Patients:  BloggerCourse.com  Fact Sheet for Healthcare Providers:  SeriousBroker.it  This test is no t yet approved or cleared by the [place] FDA and  has been authorized for detection and/or diagnosis of SARS-CoV-2 by FDA under an Emergency Use Authorization (EUA). This EUA will remain  in effect (meaning this test can be used) for the duration of the COVID-19 declaration under Section 564(b)(1) of the Act, 21 U.S.C.section 360bbb-3(b)(1), unless the authorization is terminated  or revoked sooner.       Influenza A by PCR NEGATIVE NEGATIVE Final   Influenza B by PCR NEGATIVE NEGATIVE Final    Comment: (NOTE) The Xpert Xpress SARS-CoV-2/FLU/RSV plus assay is intended as an aid in the diagnosis of influenza from Nasopharyngeal swab specimens and should not be  used as a sole basis for treatment. Nasal washings and aspirates are unacceptable for Xpert Xpress SARS-CoV-2/FLU/RSV testing.  Fact Sheet for Patients: BloggerCourse.com  Fact Sheet for Healthcare Providers: SeriousBroker.it  This test is not yet approved or cleared by the [place] FDA and has been authorized for detection and/or diagnosis of SARS-CoV-2 by FDA under an Emergency Use Authorization (EUA). This EUA will remain in effect (meaning this test can be used) for the duration of the COVID-19 declaration under Section 564(b)(1) of the Act, 21 U.S.C. section 360bbb-3(b)(1), unless the authorization is terminated or revoked.  Performed at [facility], [address], [place], [place] [address]   Blood Culture (routine x 2)     Status: None   Collection Time: 01/08/22  6:00 PM   Specimen: BLOOD  Result Value Ref Range Status   Specimen Description   Final    BLOOD LEFT ANTECUBITAL Performed at [facility], [address] [place], [place] [address]    Special Requests   Final    BOTTLES DRAWN AEROBIC AND ANAEROBIC Blood Culture adequate volume Performed at [facility], [address] [place], [place] [address]    Culture   Final    NO GROWTH 5 DAYS Performed at [facility], [address] [place], [place] [address]    Report Status 01/13/2022 FINAL  Final  MRSA Next Gen by PCR, Nasal     Status: None   Collection Time: 01/09/22  2:53 AM   Specimen: Nasal Mucosa; Nasal Swab  Result Value Ref Range Status   MRSA by PCR Next Gen NOT DETECTED NOT DETECTED Final    Comment: (NOTE) The GeneXpert MRSA Assay (FDA approved for NASAL specimens only), is one component of a comprehensive MRSA colonization surveillance program. It is not intended to diagnose MRSA infection nor to guide or monitor treatment for MRSA infections. Test performance is not FDA  approved in patients less than 64 years old. Performed at [facility], [address], [place], [place] [address]       Radiology Studies: No results found.     LOS: 6 days   Aliannah Holstrom [employer] on [email or web address]  01/14/2022, 9:59 AM

## 2022-01-14 NOTE — Op Note (Signed)
Date of Surgery: 01/14/2022  INDICATIONS: Patient is a 31 y.o.-year-old male with right forearm and hand compartment syndrome after being found down for unknown length of time.  Patient underwent initial forearm and hand fasciotomy on 9/22.  He subsequently underwent repeat irrigation and debridement this past Monday with extensive debridement of noncontractile and nonviable muscle belly in the distal half of the volar forearm and dorsal forearm.  A volar wound VAC was applied and the dorsal wound was closed.  Zentz today for repeat irrigation and excisional debridement with possible wound closure..  Risks, benefits, and alternatives to surgery were again discussed with the patient in the preoperative area. The patient wishes to proceed with surgery.  Informed consent was signed after our discussion.   PREOPERATIVE DIAGNOSIS:  Right forearm compartment syndrome Right hand compartment syndrome  POSTOPERATIVE DIAGNOSIS: Same.  PROCEDURE:   Irrigation and excisional debridement of right volar forearm muscle and fascia (wound 9x30 cm) Irrigation and excisional debridement of right dorsal forearm muscle and fascia (6x20 cm) Secondary closure of volar fasciotomy wound (9x30 cm)    SURGEON: Audria Nine, M.D.  ASSIST: Iran Planas, M.D.  ANESTHESIA:  general  IV FLUIDS AND URINE: See anesthesia.  ESTIMATED BLOOD LOSS: 10 mL.  IMPLANTS: * No implants in log *   DRAINS: Incisional WVAC volar forearm  COMPLICATIONS: None  DESCRIPTION OF PROCEDURE: The patient was met in the preoperative holding area where the surgical site was marked and the consent form was signed.  The patient was then taken to the operating room and transferred to the operating table.  All bony prominences were well padded.  A tourniquet was applied to the right upper arm.  General endotracheal anesthesia was induced.  The operative extremity was prepped and draped in the usual and sterile fashion.  A formal time-out  was performed to confirm that this was the correct patient, surgery, side, and site.   Following formal timeout, the sutures were removed from the volar forearm wound as well as the thenar and hypothenar wounds.  The volar forearm wound was examined.  As before, the proximal half of the wound had healthy appearing muscle belly that was contractile.  The muscle in the distal half of the wound again appeared to be nonviable noncontractile.  Nonviable muscle was sharply debrided using a Metzenbaum scissor.  The median nerve was protected during this portion of the procedure.  The ulnar neurovascular bundle was again identified and protected.  Following thorough debridement of all nonviable muscle, the wound was thoroughly irrigated with copious sterile saline.  The thenar and hyperthenar wounds were treated in a similar fashion with debridement of nonviable appearing muscle belly.  I then turned my attention towards the dorsal wound.  The previous sutures were removed and all nonviable and noncontractile appearing muscle belly was sharply excised using a Metzenbaum scissor.  There was some contractile muscle present.  The muscle belly in the both volar and dorsal wound appeared to have good blood supply.  Dorsal wound was thoroughly irrigated in a similar fashion.  The volar wound was able to be closed primarily using simple interrupted sutures.  With the volar wound closed, I was also able to close the dorsal wound in a similar fashion using simple interrupted sutures.  All hand fasciotomy wounds were closed using simple sutures.  Given his diffuse edema, an incisional wound VAC was placed on the volar wound.  The dorsal wound and hand fasciotomy wounds were dressed with Adaptic, folded Kerlix.  A loose Ace wrap was applied to the arm.  The patient was reversed from anesthesia and extubated uneventfully.  They were transferred from the operating table to the postoperative bed.  All counts were correct x 2 at the  end of the procedure.  The patient was then taken to the PACU in stable condition.   POSTOPERATIVE PLAN: She will return to the floor.  We will continue the incisional wound VAC for several days to try to control edema in the forearm.  We will monitor the dorsal wound dressing for saturation.  We will keep the arm elevated is much as possible to help with the swelling.  I will get a Occupational Therapy involved in the next few days to work on passive finger mobilization and to have a thermoplastic splint made with the hand in intrinsic plus safe position.  Audria Nine, MD 10:47 AM

## 2022-01-14 NOTE — Progress Notes (Signed)
Pharmacy Antibiotic Note  Chris Parker is a 31 y.o. male  with hx IVDU admitted who presented to the ED on 01/08/2022 with AMS and right arm swelling. He was found to have rhabo along with RUE compartment syndrome and underwent fasciectomy on 9/22.  He's currently on cefepime for PNA.  - 9/23: Right dorsal forearm compartment release - 9/25: I&D right upper extremity; has RUE compartment syndrome - 9/27: I&D right volar forearm muscle and fascia. Secondary closure of volar fasciotomy wound    Today, 01/14/2022: - day #6 abx --> per MD's note, plan is to treat for 7 days - wbc elevated but down slightly to 19.4  - scr trending down 1.95 (crcl~56); renal team signed off - all cultures are negative  Plan: - continue cefepime dose to 2gm q12h for renal function. - watch renal function closely ______________________________________  Height: 5\' 9"  (175.3 cm) Weight: 70.8 kg (156 lb) IBW/kg (Calculated) : 70.7  Temp (24hrs), Avg:98.6 F (37 C), Min:97.9 F (36.6 C), Max:99.5 F (37.5 C)  Recent Labs  Lab 01/08/22 1739 01/08/22 1909 01/09/22 0038 01/09/22 0058 01/09/22 0654 01/10/22 0313 01/11/22 0248 01/12/22 0254 01/13/22 0244 01/14/22 0303  WBC 26.2*  --    < >  --   --  16.5* 21.3* 20.3* 20.6* 19.4*  CREATININE 1.83*  --   --  1.60*   < > 1.85* 2.29* 2.08* 2.03* 1.95*  LATICACIDVEN 4.1* 2.9*  --  1.3  --   --   --   --   --   --    < > = values in this interval not displayed.     Estimated Creatinine Clearance: 56.4 mL/min (A) (by C-G formula based on SCr of 1.95 mg/dL (H)).    No Known Allergies  9/22 vanc >>9/24 9/22 cefepime >> 9/22 flagyl x 1    9/22 BCx x2: neg FINAL 9/22 UCx:  ngf 9/23 MRSA PCR: not detected  Thank you for allowing pharmacy to be a part of this patient's care.  Lynelle Doctor 01/14/2022 11:13 AM

## 2022-01-15 DIAGNOSIS — M6282 Rhabdomyolysis: Secondary | ICD-10-CM | POA: Diagnosis not present

## 2022-01-15 DIAGNOSIS — N179 Acute kidney failure, unspecified: Secondary | ICD-10-CM | POA: Diagnosis not present

## 2022-01-15 DIAGNOSIS — R7401 Elevation of levels of liver transaminase levels: Secondary | ICD-10-CM | POA: Diagnosis not present

## 2022-01-15 DIAGNOSIS — R778 Other specified abnormalities of plasma proteins: Secondary | ICD-10-CM

## 2022-01-15 LAB — COMPREHENSIVE METABOLIC PANEL
ALT: 133 U/L — ABNORMAL HIGH (ref 0–44)
AST: 151 U/L — ABNORMAL HIGH (ref 15–41)
Albumin: 2.2 g/dL — ABNORMAL LOW (ref 3.5–5.0)
Alkaline Phosphatase: 73 U/L (ref 38–126)
Anion gap: 6 (ref 5–15)
BUN: 49 mg/dL — ABNORMAL HIGH (ref 6–20)
CO2: 27 mmol/L (ref 22–32)
Calcium: 7.8 mg/dL — ABNORMAL LOW (ref 8.9–10.3)
Chloride: 101 mmol/L (ref 98–111)
Creatinine, Ser: 1.69 mg/dL — ABNORMAL HIGH (ref 0.61–1.24)
GFR, Estimated: 56 mL/min — ABNORMAL LOW (ref >60)
Glucose, Bld: 126 mg/dL — ABNORMAL HIGH (ref 70–99)
Potassium: 3.4 mmol/L — ABNORMAL LOW (ref 3.5–5.1)
Sodium: 134 mmol/L — ABNORMAL LOW (ref 135–145)
Total Bilirubin: 0.8 mg/dL (ref 0.3–1.2)
Total Protein: 5 g/dL — ABNORMAL LOW (ref 6.5–8.1)

## 2022-01-15 LAB — CBC
HCT: 27.5 % — ABNORMAL LOW (ref 39.0–52.0)
Hemoglobin: 9.7 g/dL — ABNORMAL LOW (ref 13.0–17.0)
MCH: 31.2 pg (ref 26.0–34.0)
MCHC: 35.3 g/dL (ref 30.0–36.0)
MCV: 88.4 fL (ref 80.0–100.0)
Platelets: 247 10*3/uL (ref 150–400)
RBC: 3.11 MIL/uL — ABNORMAL LOW (ref 4.22–5.81)
RDW: 13.2 % (ref 11.5–15.5)
WBC: 15.2 10*3/uL — ABNORMAL HIGH (ref 4.0–10.5)
nRBC: 0 % (ref 0.0–0.2)

## 2022-01-15 LAB — IRON AND TIBC
Iron: 61 ug/dL (ref 45–182)
Saturation Ratios: 18 % (ref 17.9–39.5)
TIBC: 346 ug/dL (ref 250–450)
UIBC: 285 ug/dL

## 2022-01-15 LAB — RETICULOCYTES
Immature Retic Fract: 18 % — ABNORMAL HIGH (ref 2.3–15.9)
RBC.: 3.15 MIL/uL — ABNORMAL LOW (ref 4.22–5.81)
Retic Count, Absolute: 135.4 10*3/uL (ref 19.0–186.0)
Retic Ct Pct: 4.3 % — ABNORMAL HIGH (ref 0.4–3.1)

## 2022-01-15 LAB — FERRITIN: Ferritin: 107 ng/mL (ref 24–336)

## 2022-01-15 LAB — MAGNESIUM: Magnesium: 2 mg/dL (ref 1.7–2.4)

## 2022-01-15 LAB — FOLATE: Folate: 7.2 ng/mL

## 2022-01-15 LAB — VITAMIN B12: Vitamin B-12: 357 pg/mL (ref 180–914)

## 2022-01-15 MED ORDER — POTASSIUM CHLORIDE CRYS ER 20 MEQ PO TBCR
40.0000 meq | EXTENDED_RELEASE_TABLET | Freq: Once | ORAL | Status: AC
  Administered 2022-01-15: 40 meq via ORAL
  Filled 2022-01-15: qty 2

## 2022-01-15 NOTE — Progress Notes (Signed)
Physical Therapy Treatment Patient Details Name: Chris Parker MRN: [id number] DOB: 1990-08-09 Today's Date: 01/15/2022   History of Present Illness 31 year old male found down on the ground on his right side for unclear duration of time.  He was found to have R UE compartment syndrome and is s/p an emergent R UE fasciotomy on 01/09/22, and  I&D on 01/11/22 and 01/13/22.    PT Comments    Pt tolerated increased ambulation distance today, RLE remains weak with decreased ankle dorsiflexion ROM & strength, and decreased sensation (pt reports RLE feels "asleep"). Pt puts forth good effort. Encouraged pt to continue working on R ankle and knee strengthening exercises.    Recommendations for follow up therapy are one component of a multi-disciplinary discharge planning process, led by the attending physician.  Recommendations may be updated based on patient status, additional functional criteria and insurance authorization.  Follow Up Recommendations  Outpatient PT     Assistance Recommended at Discharge Intermittent Supervision/Assistance  Patient can return home with the following A little help with walking and/or transfers;A little help with bathing/dressing/bathroom;Assistance with cooking/housework;Direct supervision/assist for medications management;Assist for transportation;Help with stairs or ramp for entrance   Equipment Recommendations  Cane    Recommendations for Other Services       Precautions / Restrictions Precautions Precautions: Fall Precaution Comments: no falls in past 6 months; wound VAC R forearm Restrictions Weight Bearing Restrictions: No Other Position/Activity Restrictions: assuming NWB RUE     Mobility  Bed Mobility Overal bed mobility: Needs Assistance Bed Mobility: Supine to Sit     Supine to sit: Supervision, HOB elevated     General bed mobility comments: increased time/effort, used rail, HOB up    Transfers Overall transfer level: Needs  assistance Equipment used: None Transfers: Sit to/from Stand Sit to Stand: Supervision                Ambulation/Gait Ambulation/Gait assistance: Min guard Gait Distance (Feet): 60 Feet Assistive device: IV Pole Gait Pattern/deviations: Step-to pattern, Decreased stance time - right, Decreased step length - right, Decreased step length - left, Decreased dorsiflexion - right Gait velocity: decr     General Gait Details: pt reports RLE "feels asleep", increased time/effort to advance RLE, decr WBing RLE; held IV pole with LUE   Stairs             Wheelchair Mobility    Modified Rankin (Stroke Patients Only)       Balance Overall balance assessment: Needs assistance Sitting-balance support: Feet supported, No upper extremity supported Sitting balance-Leahy Scale: Good     Standing balance support: During functional activity, Reliant on assistive device for balance, Single extremity supported Standing balance-Leahy Scale: Fair Standing balance comment: relies on UE support for transfer                            Cognition Arousal/Alertness: Awake/alert Behavior During Therapy: WFL for tasks assessed/performed Overall Cognitive Status: Within Functional Limits for tasks assessed                                 General Comments: Oriented x4, able to follow commands without difficulty        Exercises General Exercises - Lower Extremity Ankle Circles/Pumps: AROM, Right, 10 reps, Seated Long Arc Quad: AROM, Right, 10 reps, Seated    General Comments  Pertinent Vitals/Pain Pain Assessment Pain Score: 7  Pain Location: R forearm Pain Descriptors / Indicators: Sore, Tender Pain Intervention(s): Limited activity within patient's tolerance, Monitored during session, Premedicated before session, Repositioned    Home Living                          Prior Function            PT Goals (current goals can now be  found in the care plan section) Acute Rehab PT Goals 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 PT Goal Formulation: With patient Time For Goal Achievement: 01/28/22 Potential to Achieve Goals: Good Progress towards PT goals: Progressing toward goals    Frequency    Min 3X/week      PT Plan Current plan remains appropriate    Co-evaluation              AM-PAC PT "6 Clicks" Mobility   Outcome Measure  Help needed turning from your back to your side while in a flat bed without using bedrails?: None Help needed moving from lying on your back to sitting on the side of a flat bed without using bedrails?: A Little Help needed moving to and from a bed to a chair (including a wheelchair)?: A Little Help needed standing up from a chair using your arms (e.g., wheelchair or bedside chair)?: A Little Help needed to walk in hospital room?: A Little Help needed climbing 3-5 steps with a railing? : A Lot 6 Click Score: 18    End of Session Equipment Utilized During Treatment: Gait belt Activity Tolerance: Patient tolerated treatment well Patient left: in chair;with call bell/phone within reach;with chair alarm set Nurse Communication: Mobility status PT Visit Diagnosis: Difficulty in walking, not elsewhere classified (R26.2);Pain;Muscle weakness (generalized) (M62.81);Other abnormalities of gait and mobility (R26.89);Unsteadiness on feet (R26.81) Pain - Right/Left: Right Pain - part of body: Arm     Time: 1459-1515 PT Time Calculation (min) (ACUTE ONLY): 16 min  Charges:  $Gait Training: 8-22 mins                     Ralene Bathe Kistler PT 01/15/2022  Acute Rehabilitation Services  Office [phone]

## 2022-01-15 NOTE — TOC Initial Note (Addendum)
Transition of Care [facility]) - Initial/Assessment Note    Patient Details  Name: Chris Parker MRN: [id number] Date of Birth: [birth year]  [facility] CM/SW Contact:    Lennart Pall, LCSW Phone Number: 01/15/2022, 2:33 PM  Clinical Narrative:                 Met with pt today to introduce CSW role and review dc planning needs.  Pt very pleasant and speaks openly with me about his SA issues and quickly notes that he would like to receive IP SA treatment when his mobility is improved and medically in better condition.  He confirms his dc plan is to return to his mother's home in [place] [facility] and states that the home is free of any drugs or alcohol and feels this is a safe location for him as he recovers.  He has an appreciation of the extent of his medical issues and how sick he was upon admission and would like to pursue SA treatment.   Pt is agreeable with CSW discussing situation with his mother and have left a VM for her.  Pt anticipating he will remain hospitalized through the weekend.  Will place SA resources on his AVS and will continue to follow.  ADDENDUM: Have had a good conversation with pt's mother who confirms all information provided by pt.  She adds that there are several family members that will be involved and they will be working on getting pt into a treatment center when he is ready.   Expected Discharge Plan: Home/Self Care (vs. with HH) Barriers to Discharge: Continued Medical Work up   Patient Goals and CMS Choice Patient states their goals for this hospitalization and ongoing recovery are:: return home with mother; begin SA treament when mobility better      Expected Discharge Plan and Services Expected Discharge Plan: Home/Self Care (vs. with HH) In-house Referral: Clinical Social Work     Living arrangements for the past 2 months: Single Family Home                                      Prior Living Arrangements/Services Living arrangements for  the past 2 months: Single Family Home Lives with:: Parents Patient language and need for interpreter reviewed:: Yes Do you feel safe going back to the place where you live?: Yes      Need for Family Participation in Patient Care: Yes (Comment) Care giver support system in place?: Yes (comment)   Criminal Activity/Legal Involvement Pertinent to Current Situation/Hospitalization: No - Comment as needed  Activities of Daily Living Home Assistive Devices/Equipment: None ADL Screening (condition at time of admission) Patient's cognitive ability adequate to safely complete daily activities?: Yes Is the patient deaf or have difficulty hearing?: No Does the patient have difficulty seeing, even when wearing glasses/contacts?: No Does the patient have difficulty concentrating, remembering, or making decisions?: No Patient able to express need for assistance with ADLs?: Yes Does the patient have difficulty dressing or bathing?: Yes Independently performs ADLs?: Yes (appropriate for developmental age) Does the patient have difficulty walking or climbing stairs?: Yes Weakness of Legs: Right Weakness of Arms/Hands: Right  Permission Sought/Granted Permission sought to share information with : Family Supports Permission granted to share information with : Yes, Verbal Permission Granted  Share Information with NAME: Hilary Hertz     Permission granted to share info w Relationship: mother  Permission  granted to share info w Contact Information: [phone]  Emotional Assessment Appearance:: Appears stated age Attitude/Demeanor/Rapport: Gracious Affect (typically observed): Accepting Orientation: : Oriented to Self, Oriented to Place, Oriented to  Time, Oriented to Situation Alcohol / Substance Use: Alcohol Use, Illicit Drugs Psych Involvement: No (comment)  Admission diagnosis:  Rhabdomyolysis [M62.82] Patient Active Problem List   Diagnosis Date Noted   Compartment syndrome ([place])  01/09/2022   SIRS (systemic inflammatory response syndrome) ([facility]) 01/09/2022   Elevated troponin 01/09/2022   Hyperkalemia 01/09/2022   AKI (acute kidney injury) ([place]) 01/09/2022   Lactic acidosis 01/09/2022   Transaminitis 01/09/2022   IV drug abuse ([place]) 01/09/2022   Rhabdomyolysis 01/08/2022   PCP:  Patient, No Pcp Per Pharmacy:   [id number] - [address], [place] [address] Phone: [phone] Fax: [phone]     Social Determinants of Health (SDOH) Interventions    Readmission Risk Interventions     No data to display

## 2022-01-15 NOTE — Progress Notes (Signed)
[facility] PROGRESS NOTE   Chris Parker PJA:[id number] DOB: 01/13/1991 DOA: 01/08/2022  PCP: Patient, No Pcp Per  Brief History/Interval Summary: 31-yom w/ history of IVDA, found down on the ground on his right side for unclear duration of time,reportedly with needle sticking out of right arm. In ED- in severe rhabdomyolysis w/ compartment syndrome Rt UE  s/p OR by Dr. Frazier Butt- emergent fasciotomy RUE, placed on aggressive IVF, cxr-right-sided airspace opacities ?asymmetric edema given that the patient was found on his right sidem procal and CTA ordere  placed on emperic broad-spectrum IV antibiotics with IV Vanco and cefepime. Also with hyperkalemia, s/p IV insulin and Lokelma, AKI creat 1.8, metabolci acidosis, transaminitis, w/ elevated troponin discussed w/ Dr. Rennis Golden, who felt that this elevation was less likely represent acute MI, but more likely on the basis of type II supply/demand mismatch- no heparin added but troponin trended and echocardiogram. ABG w/ 7.29/41/158/21.    9/23: Due to ongoing pain underwent right dorsal forearm compartment release. Placed Foley 9/25: Irrigation and debridement of right upper extremity 9/27: to Undergo repeat debridement on 9/27  Consultants: Plastic surgery.  Orthopedics.  Nephrology.  Procedures:  Emergency fasciotomy right upper extremity.   Multiple debridements to the right upper extremity    Subjective/Interval History: Patient mentions that he feels well.  He was able to stand with physical therapy yesterday and take a few steps in the room.  Still does not have any movement in his right fingers.  No other complaints offered.     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  S/P emergent fasciotomy of forearm and hand 9/22 and 9/23 and repeat on  9/25 (Dr. Frazier Butt).  Underwent another I&D on 9/27. Still not able to move the fingers on his right hand but is able to move his arm itself.  Able to move his right lower extremity.   Seen by PT and OT.  May need home health versus outpatient therapy.   Levels have gradually improved.  Fluctuating levels noted and likely because he has required surgical interventions.  Acute kidney injury Due to rhabdomyolysis. No prior labs. Nephrology was consulted to assist with management.  Renal function has improved.  Nephrology has signed off. Looks like Foley catheter has been discontinued.  Patient has been able to void. CT scan of the abdomen pelvis did not show any hydronephrosis.   Avoid nephrotoxic agents.  Hypocalcemia in the setting of hypoalbuminemia Corrected calcium is stable  Hyponatremia Mild.  Continue to monitor.  IV fluids discontinued.  Metabolic acidosis w/ lactic acidosis Lactic acid and bicarb level has normalized.    Potassium abnormalities, hyperkalemia followed by hypokalemia Replace potassium.  Magnesium is 2.0.   RUE/RLE weakness w/ decreased sensation  CT head no acute findings CT L and T-spine no acute finding.  Most likely weakness from compression neuropathy/rhabdomyolysis from laying on the right side,+/-drug use/unresponsive on floor. Appears to be regaining strength on the right side.  MRI brain was ordered but was unable to tolerate due to severe spasms and pain.  Despite Ativan this could not be accomplished Since his strength appears to be improving and his weakness is thought to be secondary to his rhabdo rather than a neurological process it is okay to hold off on the MRI.  No other focal deficits noted.   PT and OT evaluation.   Right  sided pneumonia likely aspiration pneumonia/Sepsis POA: Pro-Cal elevated 49, wbc In 26k-imaging w/ rt sided pneumonia.  Blood cultures no growth so far.   WBC remains elevated which could partly be due to other acute issues  including severe inflammation.  Noted to be afebrile. Vancomycin was discontinued.  Noted to be on cefepime.   We will plan for a 7-day course of antibiotics.  Looks like antibiotics were initiated on 9/22.  He will get his last dose this evening.  Blood cultures have been negative.  Respiratory status is stable.     Transaminitis Significantly high AST ALT likely due to rhabdomyolysis.  Improving trends noted.  Hepatitis C also contributing.  No acute abnormalities noted on ultrasound of the right upper quadrant.  Hepatitis C Hep C antibody positive and viral load 275,000.  He will need follow-up with [facility] discharge.  This appears to be a new diagnosis.  Elevated troponin  Due to demand ischemia from rhabdomyolysis/compartment syndrome, trops [phone] 1200: Dr. Debara Pickett was discussed-no further recommendation, echo with EF 60 to 65%, no RWMA   IVDA  Admits using fentanyl, urine drug screen negative.    Acute metabolic encephalopathy Likely multifactorial metabolic and toxic in the setting of IVDA, sepsis.  Mentation seems to be back to baseline.   High blood pressure no prior history of hypertension  Likely elevated due to pain.  Will not be too aggressive with blood pressure control due to his renal dysfunction.  Normocytic anemia No evidence for overt bleeding.  Some blood loss with surgery as expected.   Continue to monitor hemoglobin. Anemia panel reviewed.  Ferritin 107, iron 61, TIBC 346, percent saturation 18, vitamin N27 782, folic acid 7.2.  No clear deficiencies identified.  DVT Prophylaxis: SCDs Code Status: Full code Family Communication: Discussed with patient Disposition Plan: Looks like he will be able to return home at discharge.  He is going to live with his mother.  Status is: Inpatient Remains inpatient appropriate because: Compartment syndrome, rhabdomyolysis, acute kidney injury      Medications: Scheduled:  calcium carbonate  1 tablet Oral TID    Chlorhexidine Gluconate Cloth  6 each Topical Daily   melatonin  3 mg Oral QHS   polyethylene glycol  17 g Oral Daily   senna-docusate  1 tablet Oral BID   Continuous:  sodium chloride Stopped (01/13/22 0950)   ceFEPime (MAXIPIME) IV 2 g (01/15/22 0011)   UMP:NTIRWE chloride, acetaminophen **OR** acetaminophen, hydrALAZINE, HYDROmorphone (DILAUDID) injection, LORazepam, naLOXone (NARCAN)  injection, ondansetron (ZOFRAN) IV, mouth rinse, oxyCODONE  Antibiotics: Anti-infectives (From admission, onward)    Start     Dose/Rate Route Frequency Ordered Stop   01/11/22 2300  ceFEPIme (MAXIPIME) 2 g in sodium chloride 0.9 % 100 mL IVPB        2 g 200 mL/hr over 30 Minutes Intravenous Every 12 hours 01/11/22 1335     01/09/22 1600  vancomycin (VANCOREADY) IVPB 1500 mg/300 mL  Status:  Discontinued        1,500 mg 150 mL/hr over 120 Minutes Intravenous Every 24 hours 01/09/22 0043 01/10/22 1304   01/09/22 0200  ceFEPIme (MAXIPIME) 2 g in sodium chloride 0.9 % 100 mL IVPB  Status:  Discontinued        2 g 200 mL/hr over 30 Minutes Intravenous Every 8 hours 01/09/22 0043 01/11/22 1335   01/08/22 1715  ceFEPIme (MAXIPIME) 2 g in sodium chloride 0.9 % 100 mL IVPB        2 g  200 mL/hr over 30 Minutes Intravenous  Once 01/08/22 1713 01/08/22 1819   01/08/22 1715  metroNIDAZOLE (FLAGYL) IVPB 500 mg        500 mg 100 mL/hr over 60 Minutes Intravenous  Once 01/08/22 1713 01/08/22 1833   01/08/22 1715  vancomycin (VANCOCIN) IVPB 1000 mg/200 mL premix        1,000 mg 200 mL/hr over 60 Minutes Intravenous  Once 01/08/22 1713 01/08/22 2019       Objective:  Vital Signs  Vitals:   01/14/22 1229 01/14/22 1815 01/14/22 2115 01/15/22 0627  BP: (!) 143/80 139/79 (!) 140/80 (!) 148/79  Pulse: 81 77 81 90  Resp: 18 18 16 18   Temp: 98.7 F (37.1 C) 98.6 F (37 C) 98.6 F (37 C) 98.7 F (37.1 C)  TempSrc: Oral Oral Oral Oral  SpO2: 98% 99% 100% 98%  Weight:      Height:        Intake/Output  Summary (Last 24 hours) at 01/15/2022 1039 Last data filed at 01/15/2022 1000 Gross per 24 hour  Intake 1880 ml  Output 2425 ml  Net -545 ml    Filed Weights   01/09/22 0033 01/10/22 0500 01/13/22 1522  Weight: 78.5 kg 79 kg 70.8 kg    General appearance: Awake alert.  In no distress Resp: Clear to auscultation bilaterally.  Normal effort Cardio: S1-S2 is normal regular.  No S3-S4.  No rubs murmurs or bruit GI: Abdomen is soft.  Nontender nondistended.  Bowel sounds are present normal.  No masses organomegaly Extremities: Right arm covered in dressing.      Lab Results:  Data Reviewed: I have personally reviewed following labs and reports of the imaging studies  CBC: Recent Labs  Lab 01/08/22 1739 01/08/22 2208 01/09/22 0038 01/10/22 0313 01/11/22 0248 01/12/22 0254 01/13/22 0244 01/14/22 0303 01/15/22 0511  WBC 26.2*  --  19.8*   < > 21.3* 20.3* 20.6* 19.4* 15.2*  NEUTROABS 23.6*  --  17.6*  --   --   --   --   --   --   HGB 19.0*   < > 15.1   < > 13.2 11.1* 11.0* 10.7* 9.7*  HCT 54.9*   < > 44.7   < > 37.3* 31.8* 31.6* 29.9* 27.5*  MCV 89.4  --  90.3   < > 88.0 88.8 88.3 86.9 88.4  PLT 290  --  204   < > 198 174 205 230 247   < > = values in this interval not displayed.     Basic Metabolic Panel: Recent Labs  Lab 01/08/22 1739 01/08/22 2208 01/09/22 0058 01/09/22 0654 01/10/22 01/12/22 01/10/22 1557 01/11/22 0248 01/12/22 0254 01/13/22 0244 01/14/22 0303 01/15/22 0511  NA 134*   < > 132*   < > 134*  --  131* 133* 134* 131* 134*  K 6.1*   < > 5.7*   < > 4.1   < > 3.3* 3.7 3.0* 3.7 3.4*  CL 106  --  109   < > 107  --  99 103 99 99 101  CO2 16*  --  18*   < > 20*  --  23 23 27 26 27   GLUCOSE 147*  --  150*   < > 177*  --  178* 176* 120* 175* 126*  BUN 42*  --  42*   < > 40*  --  48* 43* 50* 49* 49*  CREATININE 1.83*  --  1.60*   < >  1.85*  --  2.29* 2.08* 2.03* 1.95* 1.69*  CALCIUM 7.9*  --  6.6*   < > 5.9*  --  7.3* 7.0* 7.4* 7.5* 7.8*  MG 2.0  --  2.1   --  1.7  --   --   --   --   --  2.0  PHOS  --   --  2.7  --  3.5  --   --   --   --   --   --    < > = values in this interval not displayed.     GFR: Estimated Creatinine Clearance: 65.1 mL/min (A) (by C-G formula based on SCr of 1.69 mg/dL (H)).  Liver Function Tests: Recent Labs  Lab 01/11/22 0248 01/12/22 0254 01/13/22 0244 01/14/22 0303 01/15/22 0511  AST 498* 331* 258* 207* 151*  ALT 253* 194* 173* 160* 133*  ALKPHOS 94 76 72 79 73  BILITOT 0.8 0.6 0.5 0.9 0.8  PROT 5.8* 4.9* 5.3* 5.3* 5.0*  ALBUMIN 2.4* 2.0* 2.1* 2.2* 2.2*     Recent Labs  Lab 01/08/22 1739  LIPASE 35     Coagulation Profile: Recent Labs  Lab 01/08/22 1739 01/09/22 0058  INR 1.2 1.5*     Cardiac Enzymes: Recent Labs  Lab 01/10/22 0313 01/11/22 0248 01/12/22 0254 01/13/22 0244 01/14/22 0303  CKTOTAL 1,412* 19,896* 13,646* 6,562* 6,033*      CBG: Recent Labs  Lab 01/08/22 1959 01/08/22 2347  GLUCAP 119* 144*      Recent Results (from the past 240 hour(s))  Urine Culture     Status: None   Collection Time: 01/08/22  5:09 PM   Specimen: In/Out Cath Urine  Result Value Ref Range Status   Specimen Description   Final    IN/OUT CATH URINE Performed at [facility], [address] [place], [place] [address]    Special Requests   Final    NONE Performed at [facility], [address] [place], [place] [address]    Culture   Final    NO GROWTH Performed at [facility], [address] [place], [place] [address]    Report Status 01/10/2022 FINAL  Final  Blood Culture (routine x 2)     Status: None   Collection Time: 01/08/22  5:39 PM   Specimen: BLOOD  Result Value Ref Range Status   Specimen Description   Final    BLOOD RIGHT ANTECUBITAL Performed at [facility], [address], [place], [place] [address]    Special Requests   Final    BOTTLES DRAWN AEROBIC AND ANAEROBIC Blood Culture adequate  volume Performed at [facility], [address], [place], [place] [address]    Culture   Final    NO GROWTH 5 DAYS Performed at [facility], [address] [place], [place] [address]    Report Status 01/13/2022 FINAL  Final  Resp Panel by RT-PCR (Flu A&B, Covid) Anterior Nasal Swab     Status: None   Collection Time: 01/08/22  5:42 PM   Specimen: Anterior Nasal Swab  Result Value Ref Range Status   SARS Coronavirus 2 by RT PCR NEGATIVE NEGATIVE Final    Comment: (NOTE) SARS-CoV-2 target nucleic acids are NOT DETECTED.  The SARS-CoV-2 RNA is generally detectable in upper respiratory specimens during the acute phase of infection. The lowest concentration of SARS-CoV-2 viral copies this assay can detect is 138 copies/mL. A  negative result does not preclude SARS-Cov-2 infection and should not be used as the sole basis for treatment or other patient management decisions. A negative result may occur with  improper specimen collection/handling, submission of specimen other than nasopharyngeal swab, presence of viral mutation(s) within the areas targeted by this assay, and inadequate number of viral copies(<138 copies/mL). A negative result must be combined with clinical observations, patient history, and epidemiological information. The expected result is Negative.  Fact Sheet for Patients:  BloggerCourse.comhttps://www.fda.gov/media/152166/download  Fact Sheet for Healthcare Providers:  SeriousBroker.ithttps://www.fda.gov/media/152162/download  This test is no t yet approved or cleared by the [place] FDA and  has been authorized for detection and/or diagnosis of SARS-CoV-2 by FDA under an Emergency Use Authorization (EUA). This EUA will remain  in effect (meaning this test can be used) for the duration of the COVID-19 declaration under Section 564(b)(1) of the Act, 21 U.S.C.section 360bbb-3(b)(1), unless the authorization is terminated  or revoked sooner.       Influenza A by  PCR NEGATIVE NEGATIVE Final   Influenza B by PCR NEGATIVE NEGATIVE Final    Comment: (NOTE) The Xpert Xpress SARS-CoV-2/FLU/RSV plus assay is intended as an aid in the diagnosis of influenza from Nasopharyngeal swab specimens and should not be used as a sole basis for treatment. Nasal washings and aspirates are unacceptable for Xpert Xpress SARS-CoV-2/FLU/RSV testing.  Fact Sheet for Patients: BloggerCourse.comhttps://www.fda.gov/media/152166/download  Fact Sheet for Healthcare Providers: SeriousBroker.ithttps://www.fda.gov/media/152162/download  This test is not yet approved or cleared by the [place] FDA and has been authorized for detection and/or diagnosis of SARS-CoV-2 by FDA under an Emergency Use Authorization (EUA). This EUA will remain in effect (meaning this test can be used) for the duration of the COVID-19 declaration under Section 564(b)(1) of the Act, 21 U.S.C. section 360bbb-3(b)(1), unless the authorization is terminated or revoked.  Performed at [facility], [address] [place], [place] [address]   Blood Culture (routine x 2)     Status: None   Collection Time: 01/08/22  6:00 PM   Specimen: BLOOD  Result Value Ref Range Status   Specimen Description   Final    BLOOD LEFT ANTECUBITAL Performed at [facility], [address] [place], [place] [address]    Special Requests   Final    BOTTLES DRAWN AEROBIC AND ANAEROBIC Blood Culture adequate volume Performed at [facility], [address] [place], [place] [address]    Culture   Final    NO GROWTH 5 DAYS Performed at [facility], [address] [place], [place] [address]    Report Status 01/13/2022 FINAL  Final  MRSA Next Gen by PCR, Nasal     Status: None   Collection Time: 01/09/22  2:53 AM   Specimen: Nasal Mucosa; Nasal Swab  Result Value Ref Range Status   MRSA by PCR Next Gen NOT DETECTED NOT DETECTED Final    Comment: (NOTE) The GeneXpert MRSA Assay (FDA  approved for NASAL specimens only), is one component of a comprehensive MRSA colonization surveillance program. It is not intended to diagnose MRSA infection nor to guide or monitor treatment for MRSA infections. Test performance is not FDA approved in patients less than 29 years old. Performed at [facility], [address] [place], [place] [address]       Radiology Studies: No results found.     LOS: 7 days   Kaylina Cahue [employer]Krishnan  [facility] Pager on [email or web address]  01/15/2022,  10:39 AM

## 2022-01-16 DIAGNOSIS — R778 Other specified abnormalities of plasma proteins: Secondary | ICD-10-CM | POA: Diagnosis not present

## 2022-01-16 DIAGNOSIS — M6282 Rhabdomyolysis: Secondary | ICD-10-CM | POA: Diagnosis not present

## 2022-01-16 DIAGNOSIS — R7401 Elevation of levels of liver transaminase levels: Secondary | ICD-10-CM | POA: Diagnosis not present

## 2022-01-16 DIAGNOSIS — N179 Acute kidney failure, unspecified: Secondary | ICD-10-CM | POA: Diagnosis not present

## 2022-01-16 MED ORDER — OXYCODONE HCL 5 MG PO TABS
5.0000 mg | ORAL_TABLET | ORAL | Status: DC | PRN
  Administered 2022-01-16 – 2022-01-17 (×6): 10 mg via ORAL
  Administered 2022-01-17: 5 mg via ORAL
  Administered 2022-01-17 – 2022-01-19 (×11): 10 mg via ORAL
  Administered 2022-01-19: 5 mg via ORAL
  Administered 2022-01-19 – 2022-01-23 (×21): 10 mg via ORAL
  Filled 2022-01-16 (×40): qty 2

## 2022-01-16 NOTE — Progress Notes (Signed)
Physical Therapy Treatment Patient Details Name: Chris Parker MRN: [id number] DOB: 1991-03-13 Today's Date: 01/16/2022   History of Present Illness 31 year old male found down on the ground on his right side for unclear duration of time.  He was found to have R UE compartment syndrome and is s/p an emergent R UE fasciotomy on 01/09/22, and  I&D on 01/11/22 and 01/13/22.    PT Comments    General Comments: Oriented x4, able to follow commands without difficulty. Pt was OOB in recliner.  General transfer comment: good use of L UE and L LE  strength to perform sit to stand.  R UE in ACE wrap cast with elbow flexion and c.o R LE weakness/pain "whole leg". Assisted with amb was difficult.  Last session, pt held to IV pole.  This session will attempt to use a cane.  General Gait Details: VERY unsteady gait with heavy lean on can.  Would benefit from a small base quad cane vs SPC.  Decreased weight shift/WBing R LE which appears <25% with c/o pain.Fatigues quickly/limited activity tolerance.  Assisted back to recliner.   Pt plans to return home.    Recommendations for follow up therapy are one component of a multi-disciplinary discharge planning process, led by the attending physician.  Recommendations may be updated based on patient status, additional functional criteria and insurance authorization.  Follow Up Recommendations  Outpatient PT     Assistance Recommended at Discharge Intermittent Supervision/Assistance  Patient can return home with the following A little help with walking and/or transfers;A little help with bathing/dressing/bathroom;Assistance with cooking/housework;Direct supervision/assist for medications management;Assist for transportation;Help with stairs or ramp for entrance   Equipment Recommendations  Other (comment) (small base [employer])    Recommendations for Other Services       Precautions / Restrictions Precautions Precautions: Fall Precaution Comments: no falls in  past 6 months; wound VAC R forearm Restrictions Weight Bearing Restrictions: No Other Position/Activity Restrictions: assuming NWB RUE     Mobility  Bed Mobility               General bed mobility comments: OOB in recliner    Transfers Overall transfer level: Needs assistance Equipment used: None Transfers: Sit to/from Stand Sit to Stand: Supervision, Min guard           General transfer comment: good use of L UE and L LE  strength to perform sit to stand.  R UE in ACE wrap cast with elbow flexion and c.o R LE weakness/pain "whole leg".    Ambulation/Gait Ambulation/Gait assistance: Min assist, Mod assist Gait Distance (Feet): 45 Feet Assistive device: Straight cane Gait Pattern/deviations: Step-to pattern, Decreased stance time - right, Decreased step length - right, Decreased step length - left, Decreased dorsiflexion - right Gait velocity: decr     General Gait Details: VERY unsteady gait with heavy lean on can.  Would benefit from a small base quad cane vs SPC.  Decreased weight shift/WBing R LE which appears <25% with c/o pain.   Stairs             Wheelchair Mobility    Modified Rankin (Stroke Patients Only)       Balance                                            Cognition Arousal/Alertness: Awake/alert Behavior During Therapy: WFL for tasks  assessed/performed Overall Cognitive Status: Within Functional Limits for tasks assessed                                 General Comments: Oriented x4, able to follow commands without difficulty        Exercises      General Comments        Pertinent Vitals/Pain Pain Assessment Pain Assessment: 0-10 Pain Score: 8  Pain Location: R forearm Pain Descriptors / Indicators: Sore, Tender, Operative site guarding Pain Intervention(s): Monitored during session, Repositioned, Premedicated before session    Home Living                          Prior  Function            PT Goals (current goals can now be found in the care plan section) Progress towards PT goals: Progressing toward goals    Frequency    Min 3X/week      PT Plan Current plan remains appropriate    Co-evaluation              AM-PAC PT "6 Clicks" Mobility   Outcome Measure  Help needed turning from your back to your side while in a flat bed without using bedrails?: A Little Help needed moving from lying on your back to sitting on the side of a flat bed without using bedrails?: A Little Help needed moving to and from a bed to a chair (including a wheelchair)?: A Little Help needed standing up from a chair using your arms (e.g., wheelchair or bedside chair)?: A Little Help needed to walk in hospital room?: A Lot Help needed climbing 3-5 steps with a railing? : A Lot 6 Click Score: 16    End of Session Equipment Utilized During Treatment: Gait belt Activity Tolerance: Patient limited by fatigue;Patient limited by pain Patient left: in chair;with call bell/phone within reach;with chair alarm set Nurse Communication: Mobility status PT Visit Diagnosis: Difficulty in walking, not elsewhere classified (R26.2);Pain;Muscle weakness (generalized) (M62.81);Other abnormalities of gait and mobility (R26.89);Unsteadiness on feet (R26.81) Pain - Right/Left: Right     Time: 1202-1217 PT Time Calculation (min) (ACUTE ONLY): 15 min  Charges:  $Gait Training: 8-22 mins                     Rica Koyanagi  PTA [place] [facility] M-F          [phone] Weekend pager [phone]

## 2022-01-16 NOTE — Progress Notes (Signed)
[facility] PROGRESS NOTE   Chris Parker PPI:[id number] DOB: 06-25-1990 DOA: 01/08/2022  PCP: Patient, No Pcp Per  Brief History/Interval Summary: 31-yom w/ history of IVDA, found down on the ground on his right side for unclear duration of time,reportedly with needle sticking out of right arm. In ED- in severe rhabdomyolysis w/ compartment syndrome Rt UE  s/p OR by Dr. Tempie Donning- emergent fasciotomy RUE, placed on aggressive IVF, cxr-right-sided airspace opacities ?asymmetric edema given that the patient was found on his right sidem procal and CTA ordere  placed on emperic broad-spectrum IV antibiotics with IV Vanco and cefepime. Also with hyperkalemia, s/p IV insulin and Lokelma, AKI creat 1.8, metabolci acidosis, transaminitis, w/ elevated troponin discussed w/ Dr. Debara Pickett, who felt that this elevation was less likely represent acute MI, but more likely on the basis of type II supply/demand mismatch- no heparin added but troponin trended and echocardiogram. ABG w/ 7.29/41/158/21.    9/23: Due to ongoing pain underwent right dorsal forearm compartment release. Placed Foley 9/25: Irrigation and debridement of right upper extremity 9/27: to Undergo repeat debridement on 9/27  Consultants: Plastic surgery.  Orthopedics.  Nephrology.  Procedures:  Emergency fasciotomy right upper extremity.   Multiple debridements to the right upper extremity    Subjective/Interval History: Patient denies any complaints this morning.  Pain is reasonably well controlled.  Still no movement in the fingers of his right hand.  Strength in the right leg continues to improve.      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  S/P emergent fasciotomy of forearm and hand 9/22 and 9/23 and repeat on 9/25 (Dr. Tempie Donning).   Underwent another I&D on 9/27. Still not able to move the fingers on his right hand but is able to move his arm itself.  Able to move his right lower extremity.   Seen by PT and OT.  May need home health versus outpatient therapy.   CK Levels have gradually improved.  Fluctuating levels noted and likely because he has required surgical interventions. Patient is stable for the most part.  Any acute issues regarding the right arm or hand will be deferred to orthopedics.    Acute kidney injury Due to rhabdomyolysis. No prior labs. Nephrology was consulted to assist with management.  Renal function has improved.  Nephrology has signed off. Foley catheter has been discontinued.  Patient has been able to void. CT scan of the abdomen pelvis did not show any hydronephrosis.   Avoid nephrotoxic agents. Recheck labs tomorrow.  Hypocalcemia in the setting of hypoalbuminemia Corrected calcium is stable  Hyponatremia Mild.  Continue to monitor.  IV fluids discontinued.  Metabolic acidosis w/ lactic acidosis Lactic acid and bicarb level has normalized.    Potassium abnormalities, hyperkalemia followed by hypokalemia Potassium was repleted yesterday.  Magnesium was 2.0.  Recheck labs tomorrow.     RUE/RLE weakness w/ decreased sensation  CT head no acute findings CT L and T-spine no acute finding.  Most likely weakness from compression neuropathy/rhabdomyolysis from laying on the right side,+/-drug use/unresponsive on floor. Has regained strength in the right lower extremity.  Now able to ambulate with assistance. PT and OT evaluation.   Right sided pneumonia likely aspiration pneumonia/Sepsis POA: Pro-Cal elevated 49, wbc In 26k-imaging w/ rt sided pneumonia.  Blood cultures no growth so far.   WBC has improved though remains elevated  which could partly be due to other acute issues including severe inflammation.  Noted to be afebrile. Vancomycin was discontinued.   Patient has completed 7-day  course of cefepime. Respiratory status is stable.     Transaminitis Significantly high AST ALT likely due to rhabdomyolysis.  Improving trends noted.  Hepatitis C also contributing.  No acute abnormalities noted on ultrasound of the right upper quadrant.  Hepatitis C Hep C antibody positive and viral load 275,000.  He will need follow-up with [facility] discharge.  This appears to be a new diagnosis.  Elevated troponin  Due to demand ischemia from rhabdomyolysis/compartment syndrome, trops [id number]> 1200: Dr. Rennis GoldenHilty was discussed-no further recommendation, echo with EF 60 to 65%, no RWMA   IVDA  Admits using fentanyl, urine drug screen negative.    Acute metabolic encephalopathy Likely multifactorial metabolic and toxic in the setting of IVDA, sepsis.  Mentation seems to be back to baseline.   High blood pressure no prior history of hypertension  Likely elevated due to pain.  Will not be too aggressive with blood pressure control due to his renal dysfunction.  Normocytic anemia No evidence for overt bleeding.  Some blood loss with surgery as expected.   Continue to monitor hemoglobin. Anemia panel reviewed.  Ferritin 107, iron 61, TIBC 346, percent saturation 18, vitamin B12 357, folic acid 7.2.  No clear deficiencies identified.  DVT Prophylaxis: SCDs Code Status: Full code Family Communication: Discussed with patient Disposition Plan: Looks like he will be able to return home at discharge.  He is going to live with his mother.  Status is: Inpatient Remains inpatient appropriate because: Compartment syndrome, rhabdomyolysis, acute kidney injury      Medications: Scheduled:  calcium carbonate  1 tablet Oral TID   Chlorhexidine Gluconate Cloth  6 each Topical Daily   melatonin  3 mg Oral QHS   polyethylene glycol  17 g Oral Daily   senna-docusate  1 tablet Oral BID   Continuous:  sodium chloride Stopped (01/13/22 0950)   QQV:ZDGLOVPRN:sodium chloride, acetaminophen **OR**  acetaminophen, hydrALAZINE, HYDROmorphone (DILAUDID) injection, LORazepam, naLOXone (NARCAN)  injection, ondansetron (ZOFRAN) IV, mouth rinse, oxyCODONE  Antibiotics: Anti-infectives (From admission, onward)    Start     Dose/Rate Route Frequency Ordered Stop   01/11/22 2300  ceFEPIme (MAXIPIME) 2 g in sodium chloride 0.9 % 100 mL IVPB        2 g 200 mL/hr over 30 Minutes Intravenous Every 12 hours 01/11/22 1335 01/15/22 2245   01/09/22 1600  vancomycin (VANCOREADY) IVPB 1500 mg/300 mL  Status:  Discontinued        1,500 mg 150 mL/hr over 120 Minutes Intravenous Every 24 hours 01/09/22 0043 01/10/22 1304   01/09/22 0200  ceFEPIme (MAXIPIME) 2 g in sodium chloride 0.9 % 100 mL IVPB  Status:  Discontinued        2 g 200 mL/hr over 30 Minutes Intravenous Every 8 hours 01/09/22 0043 01/11/22 1335   01/08/22 1715  ceFEPIme (MAXIPIME) 2 g in sodium chloride 0.9 % 100 mL IVPB        2 g 200 mL/hr over 30 Minutes Intravenous  Once 01/08/22 1713 01/08/22 1819   01/08/22 1715  metroNIDAZOLE (FLAGYL) IVPB 500 mg        500 mg 100 mL/hr over 60 Minutes Intravenous  Once 01/08/22 1713 01/08/22 1833   01/08/22 1715  vancomycin (VANCOCIN) IVPB 1000 mg/200 mL premix        1,000 mg 200 mL/hr over 60 Minutes  Intravenous  Once 01/08/22 1713 01/08/22 2019       Objective:  Vital Signs  Vitals:   01/15/22 0627 01/15/22 1337 01/15/22 2047 01/16/22 0515  BP: (!) 148/79 (!) 148/95 (!) 147/89 (!) 153/90  Pulse: 90 77 85 70  Resp: 18  18 18   Temp: 98.7 F (37.1 C) 98 F (36.7 C) 99.5 F (37.5 C) 98.5 F (36.9 C)  TempSrc: Oral Oral Oral Oral  SpO2: 98% 100% 99% 100%  Weight:    81.5 kg  Height:        Intake/Output Summary (Last 24 hours) at 01/16/2022 1044 Last data filed at 01/16/2022 0935 Gross per 24 hour  Intake 720 ml  Output 0 ml  Net 720 ml    Filed Weights   01/10/22 0500 01/13/22 1522 01/16/22 0515  Weight: 79 kg 70.8 kg 81.5 kg    General appearance: Awake alert.  In no  distress Resp: Clear to auscultation bilaterally.  Normal effort Cardio: S1-S2 is normal regular.  No S3-S4.  No rubs murmurs or bruit GI: Abdomen is soft.  Nontender nondistended.  Bowel sounds are present normal.  No masses organomegaly Extremities: Right arm in dressing.     Lab Results:  Data Reviewed: I have personally reviewed following labs and reports of the imaging studies  CBC: Recent Labs  Lab 01/11/22 0248 01/12/22 0254 01/13/22 0244 01/14/22 0303 01/15/22 0511  WBC 21.3* 20.3* 20.6* 19.4* 15.2*  HGB 13.2 11.1* 11.0* 10.7* 9.7*  HCT 37.3* 31.8* 31.6* 29.9* 27.5*  MCV 88.0 88.8 88.3 86.9 88.4  PLT 198 174 205 230 247     Basic Metabolic Panel: Recent Labs  Lab 01/10/22 0313 01/10/22 1557 01/11/22 0248 01/12/22 0254 01/13/22 0244 01/14/22 0303 01/15/22 0511  NA 134*  --  131* 133* 134* 131* 134*  K 4.1   < > 3.3* 3.7 3.0* 3.7 3.4*  CL 107  --  99 103 99 99 101  CO2 20*  --  23 23 27 26 27   GLUCOSE 177*  --  178* 176* 120* 175* 126*  BUN 40*  --  48* 43* 50* 49* 49*  CREATININE 1.85*  --  2.29* 2.08* 2.03* 1.95* 1.69*  CALCIUM 5.9*  --  7.3* 7.0* 7.4* 7.5* 7.8*  MG 1.7  --   --   --   --   --  2.0  PHOS 3.5  --   --   --   --   --   --    < > = values in this interval not displayed.     GFR: Estimated Creatinine Clearance: 65.1 mL/min (A) (by C-G formula based on SCr of 1.69 mg/dL (H)).  Liver Function Tests: Recent Labs  Lab 01/11/22 0248 01/12/22 0254 01/13/22 0244 01/14/22 0303 01/15/22 0511  AST 498* 331* 258* 207* 151*  ALT 253* 194* 173* 160* 133*  ALKPHOS 94 76 72 79 73  BILITOT 0.8 0.6 0.5 0.9 0.8  PROT 5.8* 4.9* 5.3* 5.3* 5.0*  ALBUMIN 2.4* 2.0* 2.1* 2.2* 2.2*     Cardiac Enzymes: Recent Labs  Lab 01/10/22 0313 01/11/22 0248 01/12/22 0254 01/13/22 0244 01/14/22 0303  CKTOTAL 1,412* 01/15/22* 01/16/22* 6,562* 6,033*      Recent Results (from the past 240 hour(s))  Urine Culture     Status: None   Collection Time:  01/08/22  5:09 PM   Specimen: In/Out Cath Urine  Result Value Ref Range Status   Specimen Description   Final  IN/OUT CATH URINE Performed at [facility], [address], [place], [place] [address]    Special Requests   Final    NONE Performed at [facility], [address] [place], [place] [address]    Culture   Final    NO GROWTH Performed at [facility], [address], [place], [place] [address]    Report Status 01/10/2022 FINAL  Final  Blood Culture (routine x 2)     Status: None   Collection Time: 01/08/22  5:39 PM   Specimen: BLOOD  Result Value Ref Range Status   Specimen Description   Final    BLOOD RIGHT ANTECUBITAL Performed at [facility], [address], [place], [place] [address]    Special Requests   Final    BOTTLES DRAWN AEROBIC AND ANAEROBIC Blood Culture adequate volume Performed at [facility], [address] [place], [place] [address]    Culture   Final    NO GROWTH 5 DAYS Performed at [facility], [address] [place], [place] [address]    Report Status 01/13/2022 FINAL  Final  Resp Panel by RT-PCR (Flu A&B, Covid) Anterior Nasal Swab     Status: None   Collection Time: 01/08/22  5:42 PM   Specimen: Anterior Nasal Swab  Result Value Ref Range Status   SARS Coronavirus 2 by RT PCR NEGATIVE NEGATIVE Final    Comment: (NOTE) SARS-CoV-2 target nucleic acids are NOT DETECTED.  The SARS-CoV-2 RNA is generally detectable in upper respiratory specimens during the acute phase of infection. The lowest concentration of SARS-CoV-2 viral copies this assay can detect is 138 copies/mL. A negative result does not preclude SARS-Cov-2 infection and should not be used as the sole basis for treatment or other patient management decisions. A negative result may occur with  improper specimen collection/handling, submission of specimen other than nasopharyngeal swab,  presence of viral mutation(s) within the areas targeted by this assay, and inadequate number of viral copies(<138 copies/mL). A negative result must be combined with clinical observations, patient history, and epidemiological information. The expected result is Negative.  Fact Sheet for Patients:  BloggerCourse.com  Fact Sheet for Healthcare Providers:  SeriousBroker.it  This test is no t yet approved or cleared by the [place] FDA and  has been authorized for detection and/or diagnosis of SARS-CoV-2 by FDA under an Emergency Use Authorization (EUA). This EUA will remain  in effect (meaning this test can be used) for the duration of the COVID-19 declaration under Section 564(b)(1) of the Act, 21 U.S.C.section 360bbb-3(b)(1), unless the authorization is terminated  or revoked sooner.       Influenza A by PCR NEGATIVE NEGATIVE Final   Influenza B by PCR NEGATIVE NEGATIVE Final    Comment: (NOTE) The Xpert Xpress SARS-CoV-2/FLU/RSV plus assay is intended as an aid in the diagnosis of influenza from Nasopharyngeal swab specimens and should not be used as a sole basis for treatment. Nasal washings and aspirates are unacceptable for Xpert Xpress SARS-CoV-2/FLU/RSV testing.  Fact Sheet for Patients: BloggerCourse.com  Fact Sheet for Healthcare Providers: SeriousBroker.it  This test is not yet approved or cleared by the [place] FDA and has been authorized for detection and/or diagnosis of SARS-CoV-2 by FDA under an Emergency Use Authorization (EUA). This EUA will remain in effect (meaning this test can be used) for the duration of the COVID-19 declaration under Section 564(b)(1) of the Act, 21 U.S.C. section 360bbb-3(b)(1),  unless the authorization is terminated or revoked.  Performed at [facility], [address] [place], [place] [address]   Blood  Culture (routine x 2)     Status: None   Collection Time: 01/08/22  6:00 PM   Specimen: BLOOD  Result Value Ref Range Status   Specimen Description   Final    BLOOD LEFT ANTECUBITAL Performed at [facility], [address] [place], [place] [address]    Special Requests   Final    BOTTLES DRAWN AEROBIC AND ANAEROBIC Blood Culture adequate volume Performed at [facility], [address], [place], [place] [address]    Culture   Final    NO GROWTH 5 DAYS Performed at [facility], [address] [place], [place] [address]    Report Status 01/13/2022 FINAL  Final  MRSA Next Gen by PCR, Nasal     Status: None   Collection Time: 01/09/22  2:53 AM   Specimen: Nasal Mucosa; Nasal Swab  Result Value Ref Range Status   MRSA by PCR Next Gen NOT DETECTED NOT DETECTED Final    Comment: (NOTE) The GeneXpert MRSA Assay (FDA approved for NASAL specimens only), is one component of a comprehensive MRSA colonization surveillance program. It is not intended to diagnose MRSA infection nor to guide or monitor treatment for MRSA infections. Test performance is not FDA approved in patients less than 67 years old. Performed at [facility], [address] [place], [place] [address]       Radiology Studies: No results found.     LOS: 8 days   Aydian Dimmick [employer] on [email or web address]  01/16/2022, 10:44 AM

## 2022-01-16 NOTE — Progress Notes (Signed)
   Subjective:  Chris Parker is a 31 y.o. male, 3 Days Post-Op    s/p Procedure(s): IRRIGATION AND DEBRIDEMENT forearm   Patient reports pain as mild to moderate.  Noted to have blood blister on his small finger.  No significant increase in pain.  Nurse called to have it evaluated.  Objective:   VITALS:   Vitals:   01/15/22 0627 01/15/22 1337 01/15/22 2047 01/16/22 0515  BP: (!) 148/79 (!) 148/95 (!) 147/89 (!) 153/90  Pulse: 90 77 85 70  Resp: 18  18 18   Temp: 98.7 F (37.1 C) 98 F (36.7 C) 99.5 F (37.5 C) 98.5 F (36.9 C)  TempSrc: Oral Oral Oral Oral  SpO2: 98% 100% 99% 100%  Weight:    81.5 kg  Height:       Sitting in hospital chair no acute distress:   Right upper extremity:  Dry clean with incisional VAC intact with good seal.  No significant active motor function.  Mild sensation in the fingers.  Fingers warm and well-perfused with brisk capillary refill.  New blood blister on the small finger.  Fingers otherwise vascularly intact. Lab Results  Component Value Date   WBC 15.2 (H) 01/15/2022   HGB 9.7 (L) 01/15/2022   HCT 27.5 (L) 01/15/2022   MCV 88.4 01/15/2022   PLT 247 01/15/2022   BMET    Component Value Date/Time   NA 134 (L) 01/15/2022 0511   K 3.4 (L) 01/15/2022 0511   CL 101 01/15/2022 0511   CO2 27 01/15/2022 0511   GLUCOSE 126 (H) 01/15/2022 0511   BUN 49 (H) 01/15/2022 0511   CREATININE 1.69 (H) 01/15/2022 0511   CALCIUM 7.8 (L) 01/15/2022 0511   GFRNONAA 56 (L) 01/15/2022 0511     Assessment/Plan: 3 Days Post-Op   Principal Problem:   Rhabdomyolysis Active Problems:   Compartment syndrome (HCC)   SIRS (systemic inflammatory response syndrome) (HCC)   Elevated troponin   Hyperkalemia   AKI (acute kidney injury) ([place])   Lactic acidosis   Transaminitis   IV drug abuse (HCC)   Advance diet  -Recommend ice on the fingers to assist with swelling.  If the blood blister was to burst then should be cleaned up and a dry dressing  applied.  Otherwise I would recommend leaving it be to prevent any infection risk that would come from draining it.   Instructions per Dr. Tempie Donning   - Incisional [facility] on volar right forearm wound to help w/ edema fluid - Discussed importance of arm and hand elevation to help w/ swelling - Will get OT involved in next few days to work on PROM of fingers and make a resting hand splint in intrinsic plus position to prevent contracture  Faythe Casa 01/16/2022, 10:15 AM  Jonelle Sidle PA-C  Physician Assistant with Dr. Rogers, [place]

## 2022-01-16 NOTE — Progress Notes (Signed)
Per Rica Koyanagi, PTA: "a SMALL BASE QUAD CANE not single point."   This pt will need the cane prior to d/c home. TOC following.

## 2022-01-17 DIAGNOSIS — N179 Acute kidney failure, unspecified: Secondary | ICD-10-CM | POA: Diagnosis not present

## 2022-01-17 DIAGNOSIS — M6282 Rhabdomyolysis: Secondary | ICD-10-CM | POA: Diagnosis not present

## 2022-01-17 DIAGNOSIS — R7401 Elevation of levels of liver transaminase levels: Secondary | ICD-10-CM | POA: Diagnosis not present

## 2022-01-17 DIAGNOSIS — R7989 Other specified abnormal findings of blood chemistry: Secondary | ICD-10-CM

## 2022-01-17 LAB — CBC
HCT: 28.2 % — ABNORMAL LOW (ref 39.0–52.0)
Hemoglobin: 9.7 g/dL — ABNORMAL LOW (ref 13.0–17.0)
MCH: 31 pg (ref 26.0–34.0)
MCHC: 34.4 g/dL (ref 30.0–36.0)
MCV: 90.1 fL (ref 80.0–100.0)
Platelets: 274 10*3/uL (ref 150–400)
RBC: 3.13 MIL/uL — ABNORMAL LOW (ref 4.22–5.81)
RDW: 13.1 % (ref 11.5–15.5)
WBC: 14.1 10*3/uL — ABNORMAL HIGH (ref 4.0–10.5)
nRBC: 0 % (ref 0.0–0.2)

## 2022-01-17 LAB — COMPREHENSIVE METABOLIC PANEL
ALT: 94 U/L — ABNORMAL HIGH (ref 0–44)
AST: 83 U/L — ABNORMAL HIGH (ref 15–41)
Albumin: 2.1 g/dL — ABNORMAL LOW (ref 3.5–5.0)
Alkaline Phosphatase: 70 U/L (ref 38–126)
Anion gap: 5 (ref 5–15)
BUN: 37 mg/dL — ABNORMAL HIGH (ref 6–20)
CO2: 29 mmol/L (ref 22–32)
Calcium: 7.9 mg/dL — ABNORMAL LOW (ref 8.9–10.3)
Chloride: 102 mmol/L (ref 98–111)
Creatinine, Ser: 1.69 mg/dL — ABNORMAL HIGH (ref 0.61–1.24)
GFR, Estimated: 56 mL/min — ABNORMAL LOW (ref >60)
Glucose, Bld: 121 mg/dL — ABNORMAL HIGH (ref 70–99)
Potassium: 4.3 mmol/L (ref 3.5–5.1)
Sodium: 136 mmol/L (ref 135–145)
Total Bilirubin: 1 mg/dL (ref 0.3–1.2)
Total Protein: 5 g/dL — ABNORMAL LOW (ref 6.5–8.1)

## 2022-01-17 LAB — CK: Total CK: 1700 U/L — ABNORMAL HIGH (ref 49–397)

## 2022-01-17 MED ORDER — SODIUM CHLORIDE 0.9% FLUSH
3.0000 mL | INTRAVENOUS | Status: DC | PRN
  Administered 2022-01-17: 3 mL via INTRAVENOUS

## 2022-01-17 NOTE — Progress Notes (Signed)
Pt resting comfortably in bedside chair.  No pain in RUE currently.    Gen: NAD, resting comfortably Pulm: Normal WOB on RA CV: Normal rate, BUE warm and well perfused RUE: Dressing clean and dry, incisional WVAC intact w/ good seal, fingers remain warm and well perfused w/ BCR, unchanged blister on ulnar side of small finger, no active motor function in hand, minimal sensation to light touch in hand  31 yo M s/p right forearm and hand fasciotomies for comparment syndrome after being found down for unknown length of time.  Now POD 4 s/p final I&D w/ primary closure of all wounds.  - Will likely take down incisional vac in next few days and replace with clean, dry dressing - Being followed by OT, once incisional vac is removed, will have them fabricate a dorsal hand splint in intrinsic plus safe position - No plan for additional surgeries given primary wound closure  Sherilyn Cooter, M.D. [facility]

## 2022-01-17 NOTE — Progress Notes (Signed)
[facility] PROGRESS NOTE   Jyren Cerasoli NLZ:[id number] DOB: 07-May-1990 DOA: 01/08/2022  PCP: Patient, No Pcp Per  Brief History/Interval Summary: 31-yom w/ history of IVDA, found down on the ground on his right side for unclear duration of time,reportedly with needle sticking out of right arm. In ED- in severe rhabdomyolysis w/ compartment syndrome Rt UE  s/p OR by Dr. Frazier Butt- emergent fasciotomy RUE, placed on aggressive IVF, cxr-right-sided airspace opacities ?asymmetric edema given that the patient was found on his right sidem procal and CTA ordere  placed on emperic broad-spectrum IV antibiotics with IV Vanco and cefepime. Also with hyperkalemia, s/p IV insulin and Lokelma, AKI creat 1.8, metabolci acidosis, transaminitis, w/ elevated troponin discussed w/ Dr. Rennis Golden, who felt that this elevation was less likely represent acute MI, but more likely on the basis of type II supply/demand mismatch- no heparin added but troponin trended and echocardiogram. ABG w/ 7.29/41/158/21.    9/23: Due to ongoing pain underwent right dorsal forearm compartment release. Placed Foley 9/25: Irrigation and debridement of right upper extremity 9/27: to Undergo repeat debridement on 9/27  Consultants: Plastic surgery.  Orthopedics.  Nephrology.  Procedures:  Emergency fasciotomy right upper extremity.   Multiple debridements to the right upper extremity    Subjective/Interval History: Experienced some tingling in his right lower extremity this morning with but has resolved now.  Overall he feels well.  He does complain of some scrotal swelling and some discomfort especially when he walks.  Nursing to discuss the blisters on his right hand with orthopedics.     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  S/P  emergent fasciotomy of forearm and hand 9/22 and 9/23 and repeat on 9/25 (Dr. Frazier Butt).  Underwent another I&D on 9/27. Still no movement in his right hand.  Some blistering is noted which is being addressed by orthopedics. Strength in his lower extremity appears to be improving.  He has been able to ambulate this.  PT and OT following.  Outpatient therapy is recommended. CK levels have improved. Patient remains stable.  Acute kidney injury Due to rhabdomyolysis. No prior labs. Nephrology was consulted to assist with management.  Renal function has improved.  Nephrology has signed off. Foley catheter has been discontinued.  Patient has been able to void. CT scan of the abdomen pelvis did not show any hydronephrosis.   Avoid nephrotoxic agents. Creatinine stable this morning at 1.69.  Continues to have good urine output.  Hypocalcemia in the setting of hypoalbuminemia Corrected calcium is stable  Hyponatremia Noted to have improved this morning.  Check periodically.  Metabolic acidosis w/ lactic acidosis Lactic acid and bicarb level has normalized.    Potassium abnormalities, hyperkalemia followed by hypokalemia Potassium level has improved.  Magnesium was 2.0.      RUE/RLE weakness w/ decreased sensation  CT head no acute findings CT L and T-spine no acute finding.  Most likely weakness from compression neuropathy/rhabdomyolysis from laying on the right side,+/-drug use/unresponsive on floor. Has regained strength in the right lower extremity.   PT and OT evaluation.   Right sided pneumonia likely aspiration pneumonia/Sepsis POA: Pro-Cal elevated 49, wbc In 26k-imaging w/ rt sided pneumonia.  Blood cultures no growth so far.   WBC has improved though remains elevated which could partly be due to other acute issues including severe inflammation.  Noted to be afebrile.  Vancomycin was discontinued.   Patient has completed 7-day course of cefepime. Respiratory status is stable.      Transaminitis Significantly high AST ALT likely due to rhabdomyolysis. Hepatitis C also contributing.  No acute abnormalities noted on ultrasound of the right upper quadrant. LFTs gradually improving.  Hepatitis C Hep C antibody positive and viral load 275,000.  He will need follow-up with [facility] discharge.  This appears to be a new diagnosis.  Elevated troponin  Due to demand ischemia from rhabdomyolysis/compartment syndrome, trops [id number]> 1200: Dr. Rennis GoldenHilty was discussed-no further recommendation, echo with EF 60 to 65%, no RWMA   IVDA  Admits using fentanyl, urine drug screen negative.    Acute metabolic encephalopathy Likely multifactorial metabolic and toxic in the setting of IVDA, sepsis.  Mentation seems to be back to baseline.   High blood pressure no prior history of hypertension  Pain is contributing to high blood pressure.  Even not too aggressive in treating it due to his renal dysfunction.  Continue to monitor for now.  May need to consider antihypertensives at discharge if his blood pressure is still elevated at that time.  Normocytic anemia No evidence for overt bleeding.  Some blood loss with surgery as expected.   Continue to monitor hemoglobin. Anemia panel reviewed.  Ferritin 107, iron 61, TIBC 346, percent saturation 18, vitamin B12 357, folic acid 7.2.  No clear deficiencies identified.  Scrotal edema Scrotal support has been ordered.  No erythema noted.  Testes are palpable bilaterally and noted to be normal.  Nontender.  DVT Prophylaxis: SCDs Code Status: Full code Family Communication: Discussed with patient Disposition Plan: Looks like he will be able to return home at discharge.  He is going to live with his mother.  Status is: Inpatient Remains inpatient appropriate because: Compartment syndrome, rhabdomyolysis, acute kidney injury      Medications: Scheduled:  calcium carbonate  1 tablet Oral TID   melatonin  3 mg Oral QHS   polyethylene  glycol  17 g Oral Daily   senna-docusate  1 tablet Oral BID   Continuous:  sodium chloride 10 mL/hr at 01/16/22 1718   KWI:OXBDZHPRN:sodium chloride, acetaminophen **OR** acetaminophen, hydrALAZINE, HYDROmorphone (DILAUDID) injection, LORazepam, naLOXone (NARCAN)  injection, ondansetron (ZOFRAN) IV, mouth rinse, oxyCODONE, sodium chloride flush  Antibiotics: Anti-infectives (From admission, onward)    Start     Dose/Rate Route Frequency Ordered Stop   01/11/22 2300  ceFEPIme (MAXIPIME) 2 g in sodium chloride 0.9 % 100 mL IVPB        2 g 200 mL/hr over 30 Minutes Intravenous Every 12 hours 01/11/22 1335 01/15/22 2245   01/09/22 1600  vancomycin (VANCOREADY) IVPB 1500 mg/300 mL  Status:  Discontinued        1,500 mg 150 mL/hr over 120 Minutes Intravenous Every 24 hours 01/09/22 0043 01/10/22 1304   01/09/22 0200  ceFEPIme (MAXIPIME) 2 g in sodium chloride 0.9 % 100 mL IVPB  Status:  Discontinued        2 g 200 mL/hr over 30 Minutes Intravenous Every 8 hours 01/09/22 0043 01/11/22 1335   01/08/22 1715  ceFEPIme (MAXIPIME) 2 g in sodium chloride 0.9 % 100 mL IVPB        2 g 200 mL/hr over 30 Minutes Intravenous  Once 01/08/22 1713 01/08/22 1819   01/08/22 1715  metroNIDAZOLE (FLAGYL) IVPB 500 mg        500 mg 100 mL/hr over 60 Minutes Intravenous  Once 01/08/22 1713 01/08/22 1833  01/08/22 1715  vancomycin (VANCOCIN) IVPB 1000 mg/200 mL premix        1,000 mg 200 mL/hr over 60 Minutes Intravenous  Once 01/08/22 1713 01/08/22 2019       Objective:  Vital Signs  Vitals:   01/16/22 1410 01/16/22 2028 01/17/22 0459 01/17/22 0500  BP: (!) 164/84 (!) 162/76 (!) 159/83   Pulse: 87 94 91   Resp: 18 18 18    Temp: 98.3 F (36.8 C) 99.3 F (37.4 C) 98.9 F (37.2 C)   TempSrc: Oral Oral Oral   SpO2: 100% 100% 100%   Weight:    80.4 kg  Height:        Intake/Output Summary (Last 24 hours) at 01/17/2022 0949 Last data filed at 01/17/2022 0948 Gross per 24 hour  Intake 1272.51 ml  Output  1720 ml  Net -447.49 ml    Filed Weights   01/13/22 1522 01/16/22 0515 01/17/22 0500  Weight: 70.8 kg 81.5 kg 80.4 kg    General appearance: Awake alert.  In no distress Resp: Clear to auscultation bilaterally.  Normal effort Cardio: S1-S2 is normal regular.  No S3-S4.  No rubs murmurs or bruit GI: Abdomen is soft.  Nontender nondistended.  Bowel sounds are present normal.  No masses organomegaly Scrotal edema is noted.  Nontender.  Normal testes. Extremities: Right arm covered in dressing     Lab Results:  Data Reviewed: I have personally reviewed following labs and reports of the imaging studies  CBC: Recent Labs  Lab 01/12/22 0254 01/13/22 0244 01/14/22 0303 01/15/22 0511 01/17/22 0516  WBC 20.3* 20.6* 19.4* 15.2* 14.1*  HGB 11.1* 11.0* 10.7* 9.7* 9.7*  HCT 31.8* 31.6* 29.9* 27.5* 28.2*  MCV 88.8 88.3 86.9 88.4 90.1  PLT 174 205 230 247 274     Basic Metabolic Panel: Recent Labs  Lab 01/12/22 0254 01/13/22 0244 01/14/22 0303 01/15/22 0511 01/17/22 0516  NA 133* 134* 131* 134* 136  K 3.7 3.0* 3.7 3.4* 4.3  CL 103 99 99 101 102  CO2 23 27 26 27 29   GLUCOSE 176* 120* 175* 126* 121*  BUN 43* 50* 49* 49* 37*  CREATININE 2.08* 2.03* 1.95* 1.69* 1.69*  CALCIUM 7.0* 7.4* 7.5* 7.8* 7.9*  MG  --   --   --  2.0  --      GFR: Estimated Creatinine Clearance: 65.1 mL/min (A) (by C-G formula based on SCr of 1.69 mg/dL (H)).  Liver Function Tests: Recent Labs  Lab 01/12/22 0254 01/13/22 0244 01/14/22 0303 01/15/22 0511 01/17/22 0516  AST 331* 258* 207* 151* 83*  ALT 194* 173* 160* 133* 94*  ALKPHOS 76 72 79 73 70  BILITOT 0.6 0.5 0.9 0.8 1.0  PROT 4.9* 5.3* 5.3* 5.0* 5.0*  ALBUMIN 2.0* 2.1* 2.2* 2.2* 2.1*     Cardiac Enzymes: Recent Labs  Lab 01/11/22 0248 01/12/22 0254 01/13/22 0244 01/14/22 0303 01/17/22 0516  CKTOTAL 50,539* 76,734* 6,562* 6,033* 1,700*      Recent Results (from the past 240 hour(s))  Urine Culture     Status: None    Collection Time: 01/08/22  5:09 PM   Specimen: In/Out Cath Urine  Result Value Ref Range Status   Specimen Description   Final    IN/OUT CATH URINE Performed at [facility], [place] [address], [place], [place] [address]    Special Requests   Final    NONE Performed at [facility], [place] [address], [place], [place] [address]  Culture   Final    NO GROWTH Performed at [facility], [address] [place], [place] [address]    Report Status 01/10/2022 FINAL  Final  Blood Culture (routine x 2)     Status: None   Collection Time: 01/08/22  5:39 PM   Specimen: BLOOD  Result Value Ref Range Status   Specimen Description   Final    BLOOD RIGHT ANTECUBITAL Performed at [facility], [address], [place], [place] [address]    Special Requests   Final    BOTTLES DRAWN AEROBIC AND ANAEROBIC Blood Culture adequate volume Performed at [facility], [address], [place], [place] [address]    Culture   Final    NO GROWTH 5 DAYS Performed at [facility], [address] [place], [place] [address]    Report Status 01/13/2022 FINAL  Final  Resp Panel by RT-PCR (Flu A&B, Covid) Anterior Nasal Swab     Status: None   Collection Time: 01/08/22  5:42 PM   Specimen: Anterior Nasal Swab  Result Value Ref Range Status   SARS Coronavirus 2 by RT PCR NEGATIVE NEGATIVE Final    Comment: (NOTE) SARS-CoV-2 target nucleic acids are NOT DETECTED.  The SARS-CoV-2 RNA is generally detectable in upper respiratory specimens during the acute phase of infection. The lowest concentration of SARS-CoV-2 viral copies this assay can detect is 138 copies/mL. A negative result does not preclude SARS-Cov-2 infection and should not be used as the sole basis for treatment or other patient management decisions. A negative result may occur with  improper specimen collection/handling, submission of specimen other than  nasopharyngeal swab, presence of viral mutation(s) within the areas targeted by this assay, and inadequate number of viral copies(<138 copies/mL). A negative result must be combined with clinical observations, patient history, and epidemiological information. The expected result is Negative.  Fact Sheet for Patients:  BloggerCourse.com  Fact Sheet for Healthcare Providers:  SeriousBroker.it  This test is no t yet approved or cleared by the [place] FDA and  has been authorized for detection and/or diagnosis of SARS-CoV-2 by FDA under an Emergency Use Authorization (EUA). This EUA will remain  in effect (meaning this test can be used) for the duration of the COVID-19 declaration under Section 564(b)(1) of the Act, 21 U.S.C.section 360bbb-3(b)(1), unless the authorization is terminated  or revoked sooner.       Influenza A by PCR NEGATIVE NEGATIVE Final   Influenza B by PCR NEGATIVE NEGATIVE Final    Comment: (NOTE) The Xpert Xpress SARS-CoV-2/FLU/RSV plus assay is intended as an aid in the diagnosis of influenza from Nasopharyngeal swab specimens and should not be used as a sole basis for treatment. Nasal washings and aspirates are unacceptable for Xpert Xpress SARS-CoV-2/FLU/RSV testing.  Fact Sheet for Patients: BloggerCourse.com  Fact Sheet for Healthcare Providers: SeriousBroker.it  This test is not yet approved or cleared by the [place] FDA and has been authorized for detection and/or diagnosis of SARS-CoV-2 by FDA under an Emergency Use Authorization (EUA). This EUA will remain in effect (meaning this test can be used) for the duration of the COVID-19 declaration under Section 564(b)(1) of the Act, 21 U.S.C. section 360bbb-3(b)(1), unless the authorization is terminated or revoked.  Performed at [facility], [address] [place], [place] [address]   Blood Culture (routine x 2)     Status: None   Collection Time: 01/08/22  6:00 PM  Specimen: BLOOD  Result Value Ref Range Status   Specimen Description   Final    BLOOD LEFT ANTECUBITAL Performed at [facility], [address], [place], [place] [address]    Special Requests   Final    BOTTLES DRAWN AEROBIC AND ANAEROBIC Blood Culture adequate volume Performed at [facility], [address] [place], [place] [address]    Culture   Final    NO GROWTH 5 DAYS Performed at [facility], [address] [place], [place] [address]    Report Status 01/13/2022 FINAL  Final  MRSA Next Gen by PCR, Nasal     Status: None   Collection Time: 01/09/22  2:53 AM   Specimen: Nasal Mucosa; Nasal Swab  Result Value Ref Range Status   MRSA by PCR Next Gen NOT DETECTED NOT DETECTED Final    Comment: (NOTE) The GeneXpert MRSA Assay (FDA approved for NASAL specimens only), is one component of a comprehensive MRSA colonization surveillance program. It is not intended to diagnose MRSA infection nor to guide or monitor treatment for MRSA infections. Test performance is not FDA approved in patients less than 35 years old. Performed at [facility], [address] [place], [place] [address]       Radiology Studies: No results found.     LOS: 9 days   Cal Gindlesperger [employer] on [email or web address]  01/17/2022, 9:49 AM

## 2022-01-17 NOTE — Progress Notes (Signed)
MD notified of sl increase in size of blood blister on lateral side of the end of the 5th R finger. Also noted new area of discoloration on medial aspect of 5th right finger. These areas marked w marker. Quick cap refill noted on all fingernails of R hand. Enc to keep elevated, will cont to monitor.

## 2022-01-18 DIAGNOSIS — R7401 Elevation of levels of liver transaminase levels: Secondary | ICD-10-CM | POA: Diagnosis not present

## 2022-01-18 DIAGNOSIS — R7989 Other specified abnormal findings of blood chemistry: Secondary | ICD-10-CM | POA: Diagnosis not present

## 2022-01-18 DIAGNOSIS — N179 Acute kidney failure, unspecified: Secondary | ICD-10-CM | POA: Diagnosis not present

## 2022-01-18 DIAGNOSIS — M6282 Rhabdomyolysis: Secondary | ICD-10-CM | POA: Diagnosis not present

## 2022-01-18 NOTE — Progress Notes (Signed)
[facility] PROGRESS NOTE   Chris Parker [id number] DOB: 1990-10-05 DOA: 01/08/2022  PCP: Patient, No Pcp Per  Brief History/Interval Summary: 30-yom w/ history of IVDA, found down on the ground on his right side for unclear duration of time,reportedly with needle sticking out of right arm. In ED- in severe rhabdomyolysis w/ compartment syndrome Rt UE  s/p OR by Dr. Tempie Donning- emergent fasciotomy RUE, placed on aggressive IVF, cxr-right-sided airspace opacities ?asymmetric edema given that the patient was found on his right sidem procal and CTA ordere  placed on emperic broad-spectrum IV antibiotics with IV Vanco and cefepime. Also with hyperkalemia, s/p IV insulin and Lokelma, AKI creat 1.8, metabolci acidosis, transaminitis, w/ elevated troponin discussed w/ Dr. Debara Pickett, who felt that this elevation was less likely represent acute MI, but more likely on the basis of type II supply/demand mismatch- no heparin added but troponin trended and echocardiogram. ABG w/ 7.29/41/158/21.    9/23: Due to ongoing pain underwent right dorsal forearm compartment release. Placed Foley 9/25: Irrigation and debridement of right upper extremity 9/27: to Undergo repeat debridement on 9/27  Consultants: Plastic surgery.  Orthopedics.  Nephrology.  Procedures:  Emergency fasciotomy right upper extremity.   Multiple debridements to the right upper extremity    Subjective/Interval History: Denies any complaints this morning.  No chest pain shortness of breath.  No cough.  No dysuria.  No skin rashes.  Right arm and hand feels about the same.  Strength in the right lower extremity has been improving.     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  S/P emergent fasciotomy of forearm and hand 9/22 and 9/23 and repeat on  9/25 (Dr. Tempie Donning).  Underwent another I&D on 9/27. Still no movement in his right hand.  Some blistering is noted which is being addressed by orthopedics. Strength in his lower extremity appears to be improving.  He has been able to ambulate.  PT and OT following.  Outpatient therapy is recommended. CK levels have improved. Stable for the most part.  Low-grade fever noted overnight.  Subsequently he has been afebrile.  WBC had been improving up until yesterday when WBC was noted to be 14.1.  We will recheck labs tomorrow.  Acute kidney injury Due to rhabdomyolysis. No prior labs. Nephrology was consulted to assist with management.  Renal function has improved.  Nephrology has signed off. Foley catheter has been discontinued.  Patient has been able to void. CT scan of the abdomen pelvis did not show any hydronephrosis.   Avoid nephrotoxic agents. Creatinine has been stable.  Continues to have good urine output.  Hypocalcemia in the setting of hypoalbuminemia Corrected calcium is stable  Hyponatremia Improved  Metabolic acidosis w/ lactic acidosis Lactic acid and bicarb level has normalized.    Potassium abnormalities, hyperkalemia followed by hypokalemia Potassium level has improved.  Magnesium was 2.0.      RUE/RLE weakness w/ decreased sensation  CT head no acute findings CT L and T-spine no acute finding.   Most likely weakness from compression neuropathy/rhabdomyolysis from laying on the right side,+/-drug use/unresponsive on floor. Has regained strength in the right lower extremity.   PT and OT evaluation.   Right sided pneumonia likely aspiration pneumonia/Sepsis POA: Pro-Cal elevated 49, wbc In 26k-imaging w/ rt sided pneumonia.  Blood cultures no growth so far.   Vancomycin was discontinued.  Patient has completed 7-day course of cefepime. Respiratory status is stable.  Low-grade fever noted overnight.  See above.   Transaminitis Significantly high AST ALT likely due  to rhabdomyolysis. Hepatitis C also contributing.  No acute abnormalities noted on ultrasound of the right upper quadrant. LFTs gradually improving.  Hepatitis C Hep C antibody positive and viral load 275,000.  He will need follow-up with [facility] discharge.  This appears to be a new diagnosis.  Elevated troponin  Due to demand ischemia from rhabdomyolysis/compartment syndrome, trops [phone] 1200: Dr. Debara Pickett was discussed-no further recommendation, echo with EF 60 to 65%, no RWMA   IVDA  Admits using fentanyl, urine drug screen negative.    Acute metabolic encephalopathy Likely multifactorial metabolic and toxic in the setting of IVDA, sepsis.  Mentation seems to be back to baseline.   High blood pressure no prior history of hypertension  Pain is contributing to high blood pressure.  Even not too aggressive in treating it due to his renal dysfunction.  Continue to monitor for now.  May need to consider antihypertensives at discharge if his blood pressure is still elevated at that time.  Normocytic anemia No evidence for overt bleeding.  Some blood loss with surgery as expected.   Continue to monitor hemoglobin. Anemia panel reviewed.  Ferritin 107, iron 61, TIBC 346, percent saturation 18, vitamin [id number], folic acid 7.2.  No clear deficiencies identified.  Scrotal edema Scrotal support has been ordered.  No erythema noted.  Testes are palpable bilaterally and noted to be normal.  Nontender.  DVT Prophylaxis: SCDs Code Status: Full code Family Communication: Discussed with patient Disposition Plan: Looks like he will be able to return home at discharge.  He is going to live with his mother.  Status is: Inpatient Remains inpatient appropriate because: Compartment syndrome, rhabdomyolysis, acute kidney injury      Medications: Scheduled:  calcium carbonate  1 tablet Oral TID   melatonin  3 mg Oral QHS   polyethylene glycol  17 g Oral Daily   senna-docusate  1 tablet Oral  BID   Continuous:  sodium chloride 10 mL/hr at 01/16/22 1718   [id number] chloride, acetaminophen **OR** acetaminophen, hydrALAZINE, HYDROmorphone (DILAUDID) injection, LORazepam, naLOXone (NARCAN)  injection, ondansetron (ZOFRAN) IV, mouth rinse, oxyCODONE, sodium chloride flush  Antibiotics: Anti-infectives (From admission, onward)    Start     Dose/Rate Route Frequency Ordered Stop   01/11/22 2300  ceFEPIme (MAXIPIME) 2 g in sodium chloride 0.9 % 100 mL IVPB        2 g 200 mL/hr over 30 Minutes Intravenous Every 12 hours 01/11/22 1335 01/15/22 2245   01/09/22 1600  vancomycin (VANCOREADY) IVPB 1500 mg/300 mL  Status:  Discontinued        1,500 mg 150 mL/hr over 120 Minutes Intravenous Every 24 hours 01/09/22 0043 01/10/22 1304   01/09/22 0200  ceFEPIme (MAXIPIME) 2 g in sodium chloride 0.9 % 100 mL IVPB  Status:  Discontinued        2 g 200 mL/hr over 30 Minutes Intravenous Every 8 hours 01/09/22 0043 01/11/22 1335   01/08/22 1715  ceFEPIme (MAXIPIME) 2 g in sodium chloride 0.9 % 100 mL IVPB        2 g 200 mL/hr over 30 Minutes Intravenous  Once 01/08/22 1713 01/08/22 1819   01/08/22 1715  metroNIDAZOLE (FLAGYL) IVPB 500 mg        500 mg 100 mL/hr over 60 Minutes Intravenous  Once 01/08/22 1713 01/08/22 1833  01/08/22 1715  vancomycin (VANCOCIN) IVPB 1000 mg/200 mL premix        1,000 mg 200 mL/hr over 60 Minutes Intravenous  Once 01/08/22 1713 01/08/22 2019       Objective:  Vital Signs  Vitals:   01/17/22 1346 01/17/22 2116 01/18/22 0005 01/18/22 0523  BP: (!) 167/97 (!) 156/91  135/85  Pulse: (!) 104 89  88  Resp: 16 18  18   Temp: 99.2 F (37.3 C) (!) 100.7 F (38.2 C) 99.6 F (37.6 C) 98.8 F (37.1 C)  TempSrc: Oral Oral  Oral  SpO2: 100% 97%  100%  Weight:      Height:        Intake/Output Summary (Last 24 hours) at 01/18/2022 0943 Last data filed at 01/18/2022 0600 Gross per 24 hour  Intake 1874.5 ml  Output 2200 ml  Net -325.5 ml    Filed Weights    01/13/22 1522 01/16/22 0515 01/17/22 0500  Weight: 70.8 kg 81.5 kg 80.4 kg    General appearance: Awake alert.  In no distress Resp: Clear to auscultation bilaterally.  Normal effort Cardio: S1-S2 is normal regular.  No S3-S4.  No rubs murmurs or bruit GI: Abdomen is soft.  Nontender nondistended.  Bowel sounds are present normal.  No masses organomegaly Extremities: Right arm covered in dressing.   Lab Results:  Data Reviewed: I have personally reviewed following labs and reports of the imaging studies  CBC: Recent Labs  Lab 01/12/22 0254 01/13/22 0244 01/14/22 0303 01/15/22 0511 01/17/22 0516  WBC 20.3* 20.6* 19.4* 15.2* 14.1*  HGB 11.1* 11.0* 10.7* 9.7* 9.7*  HCT 31.8* 31.6* 29.9* 27.5* 28.2*  MCV 88.8 88.3 86.9 88.4 90.1  PLT 174 205 230 247 274     Basic Metabolic Panel: Recent Labs  Lab 01/12/22 0254 01/13/22 0244 01/14/22 0303 01/15/22 0511 01/17/22 0516  NA 133* 134* 131* 134* 136  K 3.7 3.0* 3.7 3.4* 4.3  CL 103 99 99 101 102  CO2 23 27 26 27 29   GLUCOSE 176* 120* 175* 126* 121*  BUN 43* 50* 49* 49* 37*  CREATININE 2.08* 2.03* 1.95* 1.69* 1.69*  CALCIUM 7.0* 7.4* 7.5* 7.8* 7.9*  MG  --   --   --  2.0  --      GFR: Estimated Creatinine Clearance: 65.1 mL/min (A) (by C-G formula based on SCr of 1.69 mg/dL (H)).  Liver Function Tests: Recent Labs  Lab 01/12/22 0254 01/13/22 0244 01/14/22 0303 01/15/22 0511 01/17/22 0516  AST 331* 258* 207* 151* 83*  ALT 194* 173* 160* 133* 94*  ALKPHOS 76 72 79 73 70  BILITOT 0.6 0.5 0.9 0.8 1.0  PROT 4.9* 5.3* 5.3* 5.0* 5.0*  ALBUMIN 2.0* 2.1* 2.2* 2.2* 2.1*     Cardiac Enzymes: Recent Labs  Lab 01/12/22 0254 01/13/22 0244 01/14/22 0303 01/17/22 0516  CKTOTAL 35,573* 6,562* 6,033* 1,700*      Recent Results (from the past 240 hour(s))  Urine Culture     Status: None   Collection Time: 01/08/22  5:09 PM   Specimen: In/Out Cath Urine  Result Value Ref Range Status   Specimen Description    Final    IN/OUT CATH URINE Performed at [facility], [place] [address], [place], [place] [address]    Special Requests   Final    NONE Performed at [facility], [place] [address] [place], [place] [address]    Culture   Final    NO GROWTH  Performed at [facility], [place] [address] [place], [place] [address]    Report Status 01/10/2022 FINAL  Final  Blood Culture (routine x 2)     Status: None   Collection Time: 01/08/22  5:39 PM   Specimen: BLOOD  Result Value Ref Range Status   Specimen Description   Final    BLOOD RIGHT ANTECUBITAL Performed at [place] [address], [place], [place] [address]    Special Requests   Final    BOTTLES DRAWN AEROBIC AND ANAEROBIC Blood Culture adequate volume Performed at [place] [address], [place], [place] [address]    Culture   Final    NO GROWTH 5 DAYS Performed at [facility], [place] [address], [place], [place] [address]    Report Status 01/13/2022 FINAL  Final  Resp Panel by RT-PCR (Flu A&B, Covid) Anterior Nasal Swab     Status: None   Collection Time: 01/08/22  5:42 PM   Specimen: Anterior Nasal Swab  Result Value Ref Range Status   SARS Coronavirus 2 by RT PCR NEGATIVE NEGATIVE Final    Comment: (NOTE) SARS-CoV-2 target nucleic acids are NOT DETECTED.  The SARS-CoV-2 RNA is generally detectable in upper respiratory specimens during the acute phase of infection. The lowest concentration of SARS-CoV-2 viral copies this assay can detect is 138 copies/mL. A negative result does not preclude SARS-Cov-2 infection and should not be used as the sole basis for treatment or other patient management decisions. A negative result may occur with  improper specimen collection/handling, submission of specimen other than nasopharyngeal swab, presence of viral mutation(s) within the areas targeted by this assay, and inadequate number of  viral copies(<138 copies/mL). A negative result must be combined with clinical observations, patient history, and epidemiological information. The expected result is Negative.  Fact Sheet for Patients:  EntrepreneurPulse.com.au  Fact Sheet for Healthcare Providers:  IncredibleEmployment.be  This test is no t yet approved or cleared by the [place] FDA and  has been authorized for detection and/or diagnosis of SARS-CoV-2 by FDA under an Emergency Use Authorization (EUA). This EUA will remain  in effect (meaning this test can be used) for the duration of the COVID-19 declaration under Section 564(b)(1) of the Act, 21 U.S.C.section 360bbb-3(b)(1), unless the authorization is terminated  or revoked sooner.       Influenza A by PCR NEGATIVE NEGATIVE Final   Influenza B by PCR NEGATIVE NEGATIVE Final    Comment: (NOTE) The Xpert Xpress SARS-CoV-2/FLU/RSV plus assay is intended as an aid in the diagnosis of influenza from Nasopharyngeal swab specimens and should not be used as a sole basis for treatment. Nasal washings and aspirates are unacceptable for Xpert Xpress SARS-CoV-2/FLU/RSV testing.  Fact Sheet for Patients: EntrepreneurPulse.com.au  Fact Sheet for Healthcare Providers: IncredibleEmployment.be  This test is not yet approved or cleared by the [place] FDA and has been authorized for detection and/or diagnosis of SARS-CoV-2 by FDA under an Emergency Use Authorization (EUA). This EUA will remain in effect (meaning this test can be used) for the duration of the COVID-19 declaration under Section 564(b)(1) of the Act, 21 U.S.C. section 360bbb-3(b)(1), unless the authorization is terminated or revoked.  Performed at [facility], [place] [address] [place], [place] [address]   Blood Culture (routine x 2)     Status: None   Collection Time: 01/08/22  6:00 PM   Specimen: BLOOD   Result Value Ref Range Status  Specimen Description   Final    BLOOD LEFT ANTECUBITAL Performed at [place] [address] [place], [place] [address]    Special Requests   Final    BOTTLES DRAWN AEROBIC AND ANAEROBIC Blood Culture adequate volume Performed at [place] [address] [place], [place] [address]    Culture   Final    NO GROWTH 5 DAYS Performed at [facility], [place] [address], [place], [place] [address]    Report Status 01/13/2022 FINAL  Final  MRSA Next Gen by PCR, Nasal     Status: None   Collection Time: 01/09/22  2:53 AM   Specimen: Nasal Mucosa; Nasal Swab  Result Value Ref Range Status   MRSA by PCR Next Gen NOT DETECTED NOT DETECTED Final    Comment: (NOTE) The GeneXpert MRSA Assay (FDA approved for NASAL specimens only), is one component of a comprehensive MRSA colonization surveillance program. It is not intended to diagnose MRSA infection nor to guide or monitor treatment for MRSA infections. Test performance is not FDA approved in patients less than 20 years old. Performed at [facility], [place] [address] [place], [place] [address]       Radiology Studies: No results found.     LOS: 10 days   Rabon Scholle [employer] on [email or web address]  01/18/2022, 9:43 AM

## 2022-01-18 NOTE — Progress Notes (Signed)
Physical Therapy Treatment Patient Details Name: Chris Parker MRN: [id number] DOB: 08/03/1990 Today's Date: 01/18/2022   History of Present Illness 31 year old male found down on the ground on his right side for unclear duration of time.  He was found to have R UE compartment syndrome and is s/p an emergent R UE fasciotomy on 01/09/22, and  I&D on 01/11/22 and 01/13/22.    PT Comments    Pt continues to progress toward acute PT goals this session with progression of ambulation distance. Pt ambulated ~32ft x2 with use of small base quad cane with up to MOD A on 1st bout and MIN A on 2nd bout, seated rest break between. PT reviewed LE HEP for promotion of improved ROM and strength of R LE. Discussed importance of continued performance of there ex outside of therapy sessions with pt and mother. Pt will benefit from continued skilled PT to increase their independence and maximize safety with mobility.     Recommendations for follow up therapy are one component of a multi-disciplinary discharge planning process, led by the attending physician.  Recommendations may be updated based on patient status, additional functional criteria and insurance authorization.  Follow Up Recommendations  Outpatient PT     Assistance Recommended at Discharge Intermittent Supervision/Assistance  Patient can return home with the following A little help with walking and/or transfers;A little help with bathing/dressing/bathroom;Assistance with cooking/housework;Direct supervision/assist for medications management;Assist for transportation;Help with stairs or ramp for entrance   Equipment Recommendations  Other (comment) (small base [employer])    Recommendations for Other Services       Precautions / Restrictions Precautions Precautions: Fall Precaution Comments: no falls in past 6 months; wound VAC R forearm Restrictions Weight Bearing Restrictions: No Other Position/Activity Restrictions: assuming NWB RUE      Mobility  Bed Mobility Overal bed mobility: Needs Assistance Bed Mobility: Supine to Sit, Sit to Supine     Supine to sit: Supervision, HOB elevated Sit to supine: Supervision, HOB elevated   General bed mobility comments: use of L LE to complete progression of R LE to EOB.    Transfers Overall transfer level: Needs assistance Equipment used: None Transfers: Sit to/from Stand Sit to Stand: Min guard, Supervision   Step pivot transfers: Min guard       General transfer comment: STS x2; supervision on 2nd sit to stand from recliner chair    Ambulation/Gait Ambulation/Gait assistance: Min assist, Mod assist Gait Distance (Feet): 55 Feet Assistive device: Quad cane Gait Pattern/deviations: Step-to pattern, Decreased stance time - right, Decreased step length - right, Decreased step length - left, Decreased dorsiflexion - right, Antalgic Gait velocity: decreased     General Gait Details: 11ft x2; seated rest between. MIN A on 2nd bout of ambulation. very unsteady gait with heavy lean on cane. decreased weight bearing on R LE- toe/forefoot walking. Pt reports improved stability with use of quad cane vs. SPC.   Stairs             Wheelchair Mobility    Modified Rankin (Stroke Patients Only)       Balance Overall balance assessment: Needs assistance Sitting-balance support: Feet supported, No upper extremity supported Sitting balance-Leahy Scale: Good     Standing balance support: During functional activity, Reliant on assistive device for balance, Single extremity supported Standing balance-Leahy Scale: Fair Standing balance comment: relies on UE support for transfer  Cognition Arousal/Alertness: Awake/alert Behavior During Therapy: WFL for tasks assessed/performed Overall Cognitive Status: Within Functional Limits for tasks assessed                                 General Comments: pleasant and  cooperative during session        Exercises Total Joint Exercises Ankle Circles/Pumps: AROM, Both, 20 reps, Supine Short Arc Quad: AROM, Right, 10 reps, Supine Heel Slides: AROM, Right, 10 reps, Supine Straight Leg Raises: AROM, Right, 10 reps, Seated Long Arc Quad: AROM, Right, 10 reps, Seated Other Exercises Other Exercises: ankle DF stretch 3 x 20sec    General Comments        Pertinent Vitals/Pain Pain Assessment Pain Assessment: 0-10 Pain Score: 7  Pain Location: R forearm Pain Descriptors / Indicators: Sore, Tender, Operative site guarding Pain Intervention(s): Limited activity within patient's tolerance, Monitored during session, Repositioned, Premedicated before session    Home Living                          Prior Function            PT Goals (current goals can now be found in the care plan section) Acute Rehab PT Goals 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 PT Goal Formulation: With patient Time For Goal Achievement: 01/28/22 Potential to Achieve Goals: Good Progress towards PT goals: Progressing toward goals    Frequency    Min 3X/week      PT Plan Current plan remains appropriate    Co-evaluation              AM-PAC PT "6 Clicks" Mobility   Outcome Measure  Help needed turning from your back to your side while in a flat bed without using bedrails?: A Little Help needed moving from lying on your back to sitting on the side of a flat bed without using bedrails?: A Little Help needed moving to and from a bed to a chair (including a wheelchair)?: A Little Help needed standing up from a chair using your arms (e.g., wheelchair or bedside chair)?: A Little Help needed to walk in hospital room?: A Lot Help needed climbing 3-5 steps with a railing? : A Lot 6 Click Score: 16    End of Session Equipment Utilized During Treatment: Gait belt Activity Tolerance: Patient tolerated treatment well Patient left: with call  bell/phone within reach;in bed;with family/visitor present Nurse Communication: Mobility status PT Visit Diagnosis: Difficulty in walking, not elsewhere classified (R26.2);Pain;Muscle weakness (generalized) (M62.81);Other abnormalities of gait and mobility (R26.89);Unsteadiness on feet (R26.81) Pain - Right/Left: Right Pain - part of body: Arm;Leg     Time: 1610-9604 PT Time Calculation (min) (ACUTE ONLY): 31 min  Charges:  $Gait Training: 8-22 mins $Therapeutic Exercise: 8-22 mins                    Lyman Speller PT, DPT  Acute Rehabilitation Services  Office [phone]  01/18/2022, 3:19 PM

## 2022-01-19 DIAGNOSIS — R7989 Other specified abnormal findings of blood chemistry: Secondary | ICD-10-CM | POA: Diagnosis not present

## 2022-01-19 DIAGNOSIS — M6282 Rhabdomyolysis: Secondary | ICD-10-CM | POA: Diagnosis not present

## 2022-01-19 DIAGNOSIS — R7401 Elevation of levels of liver transaminase levels: Secondary | ICD-10-CM | POA: Diagnosis not present

## 2022-01-19 DIAGNOSIS — N179 Acute kidney failure, unspecified: Secondary | ICD-10-CM | POA: Diagnosis not present

## 2022-01-19 LAB — CBC
HCT: 28 % — ABNORMAL LOW (ref 39.0–52.0)
Hemoglobin: 9.2 g/dL — ABNORMAL LOW (ref 13.0–17.0)
MCH: 30.5 pg (ref 26.0–34.0)
MCHC: 32.9 g/dL (ref 30.0–36.0)
MCV: 92.7 fL (ref 80.0–100.0)
Platelets: 241 10*3/uL (ref 150–400)
RBC: 3.02 MIL/uL — ABNORMAL LOW (ref 4.22–5.81)
RDW: 13 % (ref 11.5–15.5)
WBC: 15.8 10*3/uL — ABNORMAL HIGH (ref 4.0–10.5)
nRBC: 0 % (ref 0.0–0.2)

## 2022-01-19 LAB — MAGNESIUM: Magnesium: 1.8 mg/dL (ref 1.7–2.4)

## 2022-01-19 LAB — COMPREHENSIVE METABOLIC PANEL
ALT: 74 U/L — ABNORMAL HIGH (ref 0–44)
AST: 65 U/L — ABNORMAL HIGH (ref 15–41)
Albumin: 2.2 g/dL — ABNORMAL LOW (ref 3.5–5.0)
Alkaline Phosphatase: 63 U/L (ref 38–126)
Anion gap: 7 (ref 5–15)
BUN: 27 mg/dL — ABNORMAL HIGH (ref 6–20)
CO2: 27 mmol/L (ref 22–32)
Calcium: 7.6 mg/dL — ABNORMAL LOW (ref 8.9–10.3)
Chloride: 101 mmol/L (ref 98–111)
Creatinine, Ser: 1.56 mg/dL — ABNORMAL HIGH (ref 0.61–1.24)
GFR, Estimated: >60 mL/min (ref >60)
Glucose, Bld: 114 mg/dL — ABNORMAL HIGH (ref 70–99)
Potassium: 4.2 mmol/L (ref 3.5–5.1)
Sodium: 135 mmol/L (ref 135–145)
Total Bilirubin: 0.8 mg/dL (ref 0.3–1.2)
Total Protein: 5 g/dL — ABNORMAL LOW (ref 6.5–8.1)

## 2022-01-19 LAB — CK: Total CK: 1115 U/L — ABNORMAL HIGH (ref 49–397)

## 2022-01-19 NOTE — Progress Notes (Signed)
Pt seen and examined yesterday (01/18/22).    Dressings taken down from dorsal foreram and volar hand.  Dorsal incision clean, dry, and well approximated w/ some superficial skin sloughing around proximal part of incision. No drainage.  Wounds on dorsal and volar aspects of hand also clean, dry, and well approximated without drainage or surrounding erythema.   Volar incision vac intact.  Still no active motor or sensory function in hand.  All fingers warm and well perfused w/ BCR in each finger.  Will plan on removing incisional wound vac tomorrow morning and apply new clean, dry dressing.  Sherilyn Cooter, M.D. [facility]

## 2022-01-19 NOTE — Progress Notes (Signed)
[facility] PROGRESS NOTE   Chris Parker [id number] DOB: [birth year] DOA: 01/08/2022  PCP: Patient, No Pcp Per  Brief History/Interval Summary: 30-yom w/ history of IVDA, found down on the ground on his right side for unclear duration of time,reportedly with needle sticking out of right arm. In ED- in severe rhabdomyolysis w/ compartment syndrome Rt UE  s/p OR by Dr. Tempie Donning- emergent fasciotomy RUE, placed on aggressive IVF, cxr-right-sided airspace opacities ?asymmetric edema given that the patient was found on his right sidem procal and CTA ordere  placed on emperic broad-spectrum IV antibiotics with IV Vanco and cefepime. Also with hyperkalemia, s/p IV insulin and Lokelma, AKI creat 1.8, metabolci acidosis, transaminitis, w/ elevated troponin discussed w/ Dr. Debara Pickett, who felt that this elevation was less likely represent acute MI, but more likely on the basis of type II supply/demand mismatch- no heparin added but troponin trended and echocardiogram. ABG w/ 7.29/41/158/21.    9/23: Due to ongoing pain underwent right dorsal forearm compartment release. Placed Foley 9/25: Irrigation and debridement of right upper extremity 9/27: repeat debridement on 9/27  Consultants: Plastic surgery.  Orthopedics.  Nephrology.  Procedures:  Emergency fasciotomy right upper extremity.   Multiple debridements to the right upper extremity    Subjective/Interval History: Patient feels well.  Denies any complaints.  Pain in the right arm is well controlled.  Strength in the right leg is improving.    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  S/P emergent fasciotomy of forearm and hand 9/22 and 9/23 and repeat on 9/25 (Dr. Tempie Donning).  Underwent another I&D on 9/27. Still no movement in his right hand.  Some  blistering is noted which is being addressed by orthopedics. Strength in the right lower extremity appears to be improving.  Has been able to ambulate.  Seen by physical therapy.  Outpatient therapy is recommended. CK level continues to improve. Noted to have low-grade fever appears to have subsided.  WBC elevated but much better compared to last week.  Stable today.  Labs being done every 2 days. Orthopedics plans to remove the wound VAC in the next few days after which hopefully patient should be able to go home.  Acute kidney injury Due to rhabdomyolysis. No prior labs. Nephrology was consulted to assist with management.  Renal function has improved.  Nephrology has signed off. Foley catheter has been discontinued.  Patient has been able to void. CT scan of the abdomen pelvis did not show any hydronephrosis.   Avoid nephrotoxic agents. Creatinine has been stable.  Continues to have good urine output. Labs being ordered every other day.  Labs were done this morning.  Next check can be on 10/5.  Hypocalcemia in the setting of hypoalbuminemia Corrected calcium is stable  Hyponatremia Improved  Metabolic acidosis w/ lactic acidosis Lactic acid and bicarb level has normalized.    Potassium abnormalities, hyperkalemia followed by hypokalemia Potassium level has improved.  Magnesium was 2.0.      RUE/RLE weakness w/ decreased sensation  CT head no acute findings CT L and T-spine no acute finding.   Most likely weakness from compression neuropathy/rhabdomyolysis from laying on the right side,+/-drug use/unresponsive on floor. Has regained strength in the right lower extremity.     Right sided pneumonia likely aspiration pneumonia/Sepsis POA: Pro-Cal elevated 49, wbc In 26k-imaging w/ rt sided pneumonia.  Blood cultures no  growth so far.   Vancomycin was discontinued.   Patient has completed 7-day course of cefepime. Respiratory status is stable.   Transaminitis Significantly high  AST ALT likely due to rhabdomyolysis. Hepatitis C also contributing.  No acute abnormalities noted on ultrasound of the right upper quadrant. LFTs gradually improving.  Hepatitis C Hep C antibody positive and viral load 275,000.  He will need follow-up with [facility] discharge.  This appears to be a new diagnosis.  High blood pressure no prior history of hypertension  Pain is contributing to high blood pressure.  Even not too aggressive in treating it due to his renal dysfunction.  Continue to monitor for now.  May need to consider antihypertensives at discharge if his blood pressure is still elevated at that time.  Elevated troponin  Due to demand ischemia from rhabdomyolysis/compartment syndrome, trops [id number] 1200: Dr. Debara Pickett was discussed-no further recommendation, echo with EF 60 to 65%, no RWMA   IVDA  Admits using fentanyl, urine drug screen negative.    Acute metabolic encephalopathy Likely multifactorial metabolic and toxic in the setting of IVDA, sepsis.  Mentation seems to be back to baseline.   Normocytic anemia No evidence for overt bleeding.  Some blood loss with surgery as expected.   Slow drift down in hemoglobin noted over the past several days.  No overt bleeding noted.  Continue to monitor. Anemia panel reviewed.  Ferritin 107, iron 61, TIBC 346, percent saturation 18, vitamin W11 914, folic acid 7.2.  No clear deficiencies identified.  Scrotal edema Scrotal support has been ordered.  No erythema noted.  Testes are palpated bilaterally and noted to be normal without any tenderness.    DVT Prophylaxis: SCDs Code Status: Full code Family Communication: Discussed with patient Disposition Plan: Looks like he will be able to return home at discharge.  He is going to live with his mother.  Status is: Inpatient Remains inpatient appropriate because: Compartment syndrome, rhabdomyolysis, acute kidney injury      Medications: Scheduled:  calcium carbonate  1 tablet  Oral TID   melatonin  3 mg Oral QHS   polyethylene glycol  17 g Oral Daily   senna-docusate  1 tablet Oral BID   Continuous:  sodium chloride 10 mL/hr at 01/16/22 1718   NWG:NFAOZH chloride, acetaminophen **OR** acetaminophen, hydrALAZINE, HYDROmorphone (DILAUDID) injection, LORazepam, naLOXone (NARCAN)  injection, ondansetron (ZOFRAN) IV, mouth rinse, oxyCODONE, sodium chloride flush  Antibiotics: Anti-infectives (From admission, onward)    Start     Dose/Rate Route Frequency Ordered Stop   01/11/22 2300  ceFEPIme (MAXIPIME) 2 g in sodium chloride 0.9 % 100 mL IVPB        2 g 200 mL/hr over 30 Minutes Intravenous Every 12 hours 01/11/22 1335 01/15/22 2245   01/09/22 1600  vancomycin (VANCOREADY) IVPB 1500 mg/300 mL  Status:  Discontinued        1,500 mg 150 mL/hr over 120 Minutes Intravenous Every 24 hours 01/09/22 0043 01/10/22 1304   01/09/22 0200  ceFEPIme (MAXIPIME) 2 g in sodium chloride 0.9 % 100 mL IVPB  Status:  Discontinued        2 g 200 mL/hr over 30 Minutes Intravenous Every 8 hours 01/09/22 0043 01/11/22 1335   01/08/22 1715  ceFEPIme (MAXIPIME) 2 g in sodium chloride 0.9 % 100 mL IVPB        2 g 200 mL/hr over 30 Minutes Intravenous  Once 01/08/22 1713 01/08/22 1819   01/08/22 1715  metroNIDAZOLE (FLAGYL) IVPB 500 mg  500 mg 100 mL/hr over 60 Minutes Intravenous  Once 01/08/22 1713 01/08/22 1833   01/08/22 1715  vancomycin (VANCOCIN) IVPB 1000 mg/200 mL premix        1,000 mg 200 mL/hr over 60 Minutes Intravenous  Once 01/08/22 1713 01/08/22 2019       Objective:  Vital Signs  Vitals:   01/18/22 1329 01/18/22 2017 01/19/22 0557 01/19/22 1125  BP: (!) 150/78 (!) 148/70 134/81 139/76  Pulse: 92 98 98 78  Resp: 17 18 18 18   Temp: 98.8 F (37.1 C) 99.6 F (37.6 C) 99.5 F (37.5 C) 98.6 F (37 C)  TempSrc: Oral Oral Oral Oral  SpO2: 100% 100% 98% 98%  Weight:      Height:        Intake/Output Summary (Last 24 hours) at 01/19/2022 1154 Last data  filed at 01/19/2022 1126 Gross per 24 hour  Intake 1380 ml  Output 2350 ml  Net -970 ml    Filed Weights   01/13/22 1522 01/16/22 0515 01/17/22 0500  Weight: 70.8 kg 81.5 kg 80.4 kg    General appearance: Awake alert.  In no distress Resp: Clear to auscultation bilaterally.  Normal effort Cardio: S1-S2 is normal regular.  No S3-S4.  No rubs murmurs or bruit GI: Abdomen is soft.  Nontender nondistended.  Bowel sounds are present normal.  No masses organomegaly    Lab Results:  Data Reviewed: I have personally reviewed following labs and reports of the imaging studies  CBC: Recent Labs  Lab 01/13/22 0244 01/14/22 0303 01/15/22 0511 01/17/22 0516 01/19/22 0434  WBC 20.6* 19.4* 15.2* 14.1* 15.8*  HGB 11.0* 10.7* 9.7* 9.7* 9.2*  HCT 31.6* 29.9* 27.5* 28.2* 28.0*  MCV 88.3 86.9 88.4 90.1 92.7  PLT 205 230 247 274 241     Basic Metabolic Panel: Recent Labs  Lab 01/13/22 0244 01/14/22 0303 01/15/22 0511 01/17/22 0516 01/19/22 0434  NA 134* 131* 134* 136 135  K 3.0* 3.7 3.4* 4.3 4.2  CL 99 99 101 102 101  CO2 27 26 27 29 27   GLUCOSE 120* 175* 126* 121* 114*  BUN 50* 49* 49* 37* 27*  CREATININE 2.03* 1.95* 1.69* 1.69* 1.56*  CALCIUM 7.4* 7.5* 7.8* 7.9* 7.6*  MG  --   --  2.0  --  1.8     GFR: Estimated Creatinine Clearance: 70.5 mL/min (A) (by C-G formula based on SCr of 1.56 mg/dL (H)).  Liver Function Tests: Recent Labs  Lab 01/13/22 0244 01/14/22 0303 01/15/22 0511 01/17/22 0516 01/19/22 0434  AST 258* 207* 151* 83* 65*  ALT 173* 160* 133* 94* 74*  ALKPHOS 72 79 73 70 63  BILITOT 0.5 0.9 0.8 1.0 0.8  PROT 5.3* 5.3* 5.0* 5.0* 5.0*  ALBUMIN 2.1* 2.2* 2.2* 2.1* 2.2*     Cardiac Enzymes: Recent Labs  Lab 01/13/22 0244 01/14/22 0303 01/17/22 0516 01/19/22 0434  CKTOTAL 6,562* 6,033* 1,700* 1,115*      No results found for this or any previous visit (from the past 240 hour(s)).     Radiology Studies: No results found.     LOS: 11  days   Chris Parker [employer] on [email or web address]  01/19/2022, 11:54 AM

## 2022-01-20 ENCOUNTER — Inpatient Hospital Stay (HOSPITAL_COMMUNITY): Payer: Medicaid Other

## 2022-01-20 DIAGNOSIS — N179 Acute kidney failure, unspecified: Secondary | ICD-10-CM | POA: Diagnosis not present

## 2022-01-20 DIAGNOSIS — E872 Acidosis, unspecified: Secondary | ICD-10-CM

## 2022-01-20 DIAGNOSIS — M6282 Rhabdomyolysis: Secondary | ICD-10-CM | POA: Diagnosis not present

## 2022-01-20 DIAGNOSIS — R7989 Other specified abnormal findings of blood chemistry: Secondary | ICD-10-CM | POA: Diagnosis not present

## 2022-01-20 DIAGNOSIS — E875 Hyperkalemia: Secondary | ICD-10-CM

## 2022-01-20 DIAGNOSIS — M79A11 Nontraumatic compartment syndrome of right upper extremity: Secondary | ICD-10-CM

## 2022-01-20 DIAGNOSIS — R6510 Systemic inflammatory response syndrome (SIRS) of non-infectious origin without acute organ dysfunction: Secondary | ICD-10-CM

## 2022-01-20 DIAGNOSIS — R29898 Other symptoms and signs involving the musculoskeletal system: Secondary | ICD-10-CM

## 2022-01-20 DIAGNOSIS — M79604 Pain in right leg: Secondary | ICD-10-CM

## 2022-01-20 DIAGNOSIS — F1910 Other psychoactive substance abuse, uncomplicated: Secondary | ICD-10-CM

## 2022-01-20 LAB — CBC WITH DIFFERENTIAL/PLATELET
Abs Immature Granulocytes: 0.09 10*3/uL — ABNORMAL HIGH (ref 0.00–0.07)
Basophils Absolute: 0 10*3/uL (ref 0.0–0.1)
Basophils Relative: 0 %
Eosinophils Absolute: 0.1 10*3/uL (ref 0.0–0.5)
Eosinophils Relative: 1 %
HCT: 27.2 % — ABNORMAL LOW (ref 39.0–52.0)
Hemoglobin: 9.2 g/dL — ABNORMAL LOW (ref 13.0–17.0)
Immature Granulocytes: 1 %
Lymphocytes Relative: 13 %
Lymphs Abs: 1.9 10*3/uL (ref 0.7–4.0)
MCH: 31 pg (ref 26.0–34.0)
MCHC: 33.8 g/dL (ref 30.0–36.0)
MCV: 91.6 fL (ref 80.0–100.0)
Monocytes Absolute: 1.2 10*3/uL — ABNORMAL HIGH (ref 0.1–1.0)
Monocytes Relative: 9 %
Neutro Abs: 10.6 10*3/uL — ABNORMAL HIGH (ref 1.7–7.7)
Neutrophils Relative %: 76 %
Platelets: 247 10*3/uL (ref 150–400)
RBC: 2.97 MIL/uL — ABNORMAL LOW (ref 4.22–5.81)
RDW: 13 % (ref 11.5–15.5)
WBC: 14 10*3/uL — ABNORMAL HIGH (ref 4.0–10.5)
nRBC: 0 % (ref 0.0–0.2)

## 2022-01-20 LAB — COMPREHENSIVE METABOLIC PANEL
ALT: 71 U/L — ABNORMAL HIGH (ref 0–44)
AST: 59 U/L — ABNORMAL HIGH (ref 15–41)
Albumin: 2.3 g/dL — ABNORMAL LOW (ref 3.5–5.0)
Alkaline Phosphatase: 67 U/L (ref 38–126)
Anion gap: 4 — ABNORMAL LOW (ref 5–15)
BUN: 23 mg/dL — ABNORMAL HIGH (ref 6–20)
CO2: 31 mmol/L (ref 22–32)
Calcium: 7.8 mg/dL — ABNORMAL LOW (ref 8.9–10.3)
Chloride: 103 mmol/L (ref 98–111)
Creatinine, Ser: 1.47 mg/dL — ABNORMAL HIGH (ref 0.61–1.24)
GFR, Estimated: >60 mL/min (ref >60)
Glucose, Bld: 115 mg/dL — ABNORMAL HIGH (ref 70–99)
Potassium: 4.6 mmol/L (ref 3.5–5.1)
Sodium: 138 mmol/L (ref 135–145)
Total Bilirubin: 0.7 mg/dL (ref 0.3–1.2)
Total Protein: 5.6 g/dL — ABNORMAL LOW (ref 6.5–8.1)

## 2022-01-20 LAB — CK: Total CK: 1006 U/L — ABNORMAL HIGH (ref 49–397)

## 2022-01-20 LAB — MAGNESIUM: Magnesium: 1.8 mg/dL (ref 1.7–2.4)

## 2022-01-20 LAB — PHOSPHORUS: Phosphorus: 4.4 mg/dL (ref 2.5–4.6)

## 2022-01-20 MED ORDER — SODIUM CHLORIDE 0.9 % IV SOLN: INTRAVENOUS | Status: DC

## 2022-01-20 NOTE — Progress Notes (Signed)
PROGRESS NOTE    Chris Parker  WUJ:[id number] DOB: 02/23/1991 DOA: 01/08/2022 PCP: Patient, No Pcp Per   Brief Narrative:  30-yom w/ history of IVDA, found down on the ground on his right side for unclear duration of time,reportedly with needle sticking out of right arm. In ED- in severe rhabdomyolysis w/ compartment syndrome  Rt UE  s/p OR by Dr. Tempie Donning- emergent fasciotomy RUE, placed on aggressive IVF,has SIRS criteria,but without overt underlying infectious source, cxr-right-sided airspace opacities ?asymmetric edema given that the patient was found on his right sidem procal and CTA ordere  placed onemperic broad-spectrum IV antibiotics with IV Vanco and cefepime. Also with hyperkalemia, s/p IV insulin and Lokelma, AKI creat 1.8, metabolci acidosis, transaminitis, w/ elevated troponin discussed w/ Dr. Debara Pickett, who felt that this elevation was less likely represent acute MI, but more likely on the basis of type II supply/demand mismatch- no heparin added but troponin trended and echocardiogram. ABG w/ 7.29/41/158/21. CT abd.pelvis, CT T L spine and CT Head done done in ED SHOWED: Inflammatory fat stranding along the anterior and lateral aspect of the lower right chest wall, consistent with the patient's history of recent trauma. Mild patchy right middle lobe and anterior right lower lobe airspace disease, which may represent ill-defined areas of pulmonary contusion. An underlying infectious versus inflammatory process cannot be excluded. Patchy opacities on rt lung-consistent w/ pneumonia No osseus findings on L/T spine  9/23: Due to ongoing pain underwent right dorsal forearm compartment release. Placed Foley 9/25: Irrigation and debridement of right upper extremity 01/13/2022 he underwent repeat debridement  His CK has been improving and all his fingers are warm and well-perfused with BCR in each finger.  Orthospine are removing his incisional wound VAC today and applying a clean new dry dressing.   Given his right leg swelling a lower extremity venous duplex was ordered and did not show any evidence of DVT.  CK still little bit high but has not significantly improved at the time of admission and renal functions continue to improve slowly.     Assessment and Plan: No notes have been filed under this hospital service. Service: Hospitalist  Severe Rhabdomyolysis nontraumatic Compartment syndrome RUE w/ necrotic tissue on RUE: -Compartment syndrome with rhabdomyolysis due to prolonged laying on right side/unresponsive status in the setting of IVDA.  S/P emergent fasciotomy of forearm and hand 9/22 and 9/23 and repeat on 9/25 (Dr. Tempie Donning).  Underwent another I&D on 9/27. Still no movement in his right hand.  Some blistering is noted which is being addressed by orthopedics. Strength in the right lower extremity appears to be improving.  Has been able to ambulate.  Seen by physical therapy.  Outpatient therapy is recommended. CK level continues to improve and last CK was 1009 6. Noted to have low-grade fever appears to have subsided.  WBC elevated but much better compared to last week.  Stable today.  Labs being done every 2 days. Orthopedics plans to remove the wound VAC in the next few days after which hopefully patient should be able to go home. -Follow-up on Ortho recommendations   Acute kidney injury Due to rhabdomyolysis. No prior labs. Nephrology was consulted to assist with management.  Renal function has improved.  Nephrology has signed off. Foley catheter has been discontinued.  Patient has been able to void. -Patient's BUN/creatinine has improved and gone from 48/2.29 is now trended down to 20/1.47 on last check -We will resume IV fluid hydration with normal saline at 75 MLS per  hour CT scan of the abdomen pelvis did not show any hydronephrosis. -We will need to avoid further nephrotoxic medications, contrast dyes, hypotension and dehydration to ensure adequate renal  perfusion Creatinine has been stable.  Continues to have good urine output. -We will follow-up CMP in a.m.   Hypocalcemia in the setting of hypoalbuminemia -Corrected calcium is stable   Hyponatremia Improved and now sodium is 138 on last check -Continue monitor and trend intermittently repeat CMP in a.m.   Metabolic acidosis w/ lactic acidosis -Lactic acid and bicarb level has normalized.   -Patient's CO2 is now 31, anion gap is 4, chloride level is 103   Potassium abnormalities, hyperkalemia followed by hypokalemia -Potassium level has improved and was 4.6.  Magnesium was 1.8 today and replete.      RUE/RLE weakness w/ decreased sensation  -CT head no acute findings CT L and T-spine no acute finding.   -Most likely weakness from compression neuropathy/rhabdomyolysis from laying on the right side,+/-drug use/unresponsive on floor. -Has regained strength in the right lower extremity but right lower extremity was a little swollen today so ordered a right lower extremity venous duplex and was negative for DVT.     Right sided pneumonia likely aspiration pneumonia/Sepsis POA: -Pro-Cal elevated 49, wbc In 26k-imaging w/ rt sided pneumonia.  Blood cultures no growth so far.   Vancomycin was discontinued.   Patient has completed 7-day course of cefepime. Respiratory status is stable and will need an ambulatory home O2 screen prior to discharge   Transaminitis Significantly high AST ALT likely due to rhabdomyolysis.  AST is now 59 and ALT is now 71.  Hepatitis C also contributing.  No acute abnormalities noted on ultrasound of the right upper quadrant. LFTs gradually improving.   Hepatitis C -Hep C antibody positive and viral load 275,000.  He will need follow-up with [facility] discharge.  This appears to be a new diagnosis.   High blood pressure no prior history of hypertension  -Pain is contributing to high blood pressure.  Even not too aggressive in treating it due to his renal  dysfunction.  Continue to monitor for now.  May need to consider antihypertensives at discharge if his blood pressure is still elevated at that time.   Elevated troponin  Due to demand ischemia from rhabdomyolysis/compartment syndrome, trops [phone] 1200: Dr. Debara Pickett was discussed-no further recommendation, echo with EF 60 to 65%, no RWMA   IVDA  -Admits using fentanyl, urine drug screen negative.    Acute metabolic encephalopathy -Likely multifactorial metabolic and toxic in the setting of IVDA, sepsis.  Mentation seems to be back to baseline.   Normocytic anemia -No evidence for overt bleeding.  Some blood loss with surgery as expected.   Slow drift down in hemoglobin noted over the past several days.  No overt bleeding noted.  Continue to monitor. Anemia panel reviewed.  Ferritin 107, iron 61, TIBC 346, percent saturation 18, vitamin [id number], folic acid 7.2.  No clear deficiencies identified. -Continue monitor for signs and symptoms of bleeding; no overt bleeding noted and hemoglobin/hematocrit has been stable last few checks and is now 9.2/27.2   Scrotal edema Scrotal support has been ordered.  No erythema noted.  Testes are palpated bilaterally and noted to be normal without any tenderness.   -Continue scrotal elevation  DVT prophylaxis: SCDs Start: 01/08/22 2020    Code Status: Full Code Family Communication: No family present at bedside   Disposition Plan:  Level of care: Med-Surg Status is: Inpatient  Remains inpatient appropriate because: Needs orthopedic surgery clearance prior to safe discharge disposition   Consultants:  Plastic Surgery Orthopedic Surgery Nephrology   Procedures:  As delineated as above  Antimicrobials:  Anti-infectives (From admission, onward)    Start     Dose/Rate Route Frequency Ordered Stop   01/11/22 2300  ceFEPIme (MAXIPIME) 2 g in sodium chloride 0.9 % 100 mL IVPB        2 g 200 mL/hr over 30 Minutes Intravenous Every 12 hours  01/11/22 1335 01/15/22 2245   01/09/22 1600  vancomycin (VANCOREADY) IVPB 1500 mg/300 mL  Status:  Discontinued        1,500 mg 150 mL/hr over 120 Minutes Intravenous Every 24 hours 01/09/22 0043 01/10/22 1304   01/09/22 0200  ceFEPIme (MAXIPIME) 2 g in sodium chloride 0.9 % 100 mL IVPB  Status:  Discontinued        2 g 200 mL/hr over 30 Minutes Intravenous Every 8 hours 01/09/22 0043 01/11/22 1335   01/08/22 1715  ceFEPIme (MAXIPIME) 2 g in sodium chloride 0.9 % 100 mL IVPB        2 g 200 mL/hr over 30 Minutes Intravenous  Once 01/08/22 1713 01/08/22 1819   01/08/22 1715  metroNIDAZOLE (FLAGYL) IVPB 500 mg        500 mg 100 mL/hr over 60 Minutes Intravenous  Once 01/08/22 1713 01/08/22 1833   01/08/22 1715  vancomycin (VANCOCIN) IVPB 1000 mg/200 mL premix        1,000 mg 200 mL/hr over 60 Minutes Intravenous  Once 01/08/22 1713 01/08/22 2019       Subjective: And examined at bedside he is doing okay.  States that he is having some pain in his arm but is doing okay.  Denies chest pain or shortness of breath.  States his right leg is a little bit more swollen.  No other concerns or complaints this time.  Objective: Vitals:   01/19/22 2152 01/20/22 0416 01/20/22 0421 01/20/22 1319  BP:  139/70  (!) 159/94  Pulse:  96  100  Resp:  16  17  Temp: 99.9 F (37.7 C) 98.7 F (37.1 C)  98.5 F (36.9 C)  TempSrc: Oral Oral  Oral  SpO2:  99%  97%  Weight:   76.1 kg   Height:        Intake/Output Summary (Last 24 hours) at 01/20/2022 1320 Last data filed at 01/20/2022 1000 Gross per 24 hour  Intake 600 ml  Output 1200 ml  Net -600 ml   Filed Weights   01/16/22 0515 01/17/22 0500 01/20/22 0421  Weight: 81.5 kg 80.4 kg 76.1 kg   Examination: Physical Exam:  Constitutional: WN/WD Caucasian male currently no acute distress Respiratory: Diminished to auscultation bilaterally, no wheezing, rales, rhonchi or crackles. Normal respiratory effort and patient is not tachypenic. No  accessory muscle use.  Unlabored breathing Cardiovascular: RRR, no murmurs / rubs / gallops. S1 and S2 auscultated.  Has a right lower extremity swelling and his right arm is wrapped connected to wound VAC Abdomen: Soft, non-tender, non-distended.  Bowel sounds positive.  GU: Deferred. Musculoskeletal: No clubbing / cyanosis of digits/nails.  Right arm is wrapped Skin: Has some track marks from prior IV injection sites Neurologic: CN 2-12 grossly intact with no focal deficits. Romberg sign and cerebellar reflexes not assessed.  Psychiatric: Normal judgment and insight. Alert and oriented x 3. Normal mood and appropriate affect.   Data Reviewed: I have personally reviewed following labs and  imaging studies  CBC: Recent Labs  Lab 01/14/22 0303 01/15/22 0511 01/17/22 0516 01/19/22 0434 01/20/22 0826  WBC 19.4* 15.2* 14.1* 15.8* 14.0*  NEUTROABS  --   --   --   --  10.6*  HGB 10.7* 9.7* 9.7* 9.2* 9.2*  HCT 29.9* 27.5* 28.2* 28.0* 27.2*  MCV 86.9 88.4 90.1 92.7 91.6  PLT 230 247 274 241 [id number]   Basic Metabolic Panel: Recent Labs  Lab 01/14/22 0303 01/15/22 0511 01/17/22 0516 01/19/22 0434 01/20/22 0826  NA 131* 134* 136 135 138  K 3.7 3.4* 4.3 4.2 4.6  CL 99 101 102 101 103  CO2 26 27 29 27 31   GLUCOSE 175* 126* 121* 114* 115*  BUN 49* 49* 37* 27* 23*  CREATININE 1.95* 1.69* 1.69* 1.56* 1.47*  CALCIUM 7.5* 7.8* 7.9* 7.6* 7.8*  MG  --  2.0  --  1.8 1.8  PHOS  --   --   --   --  4.4   GFR: Estimated Creatinine Clearance: 74.8 mL/min (A) (by C-G formula based on SCr of 1.47 mg/dL (H)). Liver Function Tests: Recent Labs  Lab 01/14/22 0303 01/15/22 0511 01/17/22 0516 01/19/22 0434 01/20/22 0826  AST 207* 151* 83* 65* 59*  ALT 160* 133* 94* 74* 71*  ALKPHOS 79 73 70 63 67  BILITOT 0.9 0.8 1.0 0.8 0.7  PROT 5.3* 5.0* 5.0* 5.0* 5.6*  ALBUMIN 2.2* 2.2* 2.1* 2.2* 2.3*   No results for input(s): "LIPASE", "AMYLASE" in the last 168 hours. No results for input(s): "AMMONIA"  in the last 168 hours. Coagulation Profile: No results for input(s): "INR", "PROTIME" in the last 168 hours. Cardiac Enzymes: Recent Labs  Lab 01/14/22 0303 01/17/22 0516 01/19/22 0434 01/20/22 0826  CKTOTAL 6,033* 1,700* 1,115* 1,006*   BNP (last 3 results) No results for input(s): "PROBNP" in the last 8760 hours. HbA1C: No results for input(s): "HGBA1C" in the last 72 hours. CBG: No results for input(s): "GLUCAP" in the last 168 hours. Lipid Profile: No results for input(s): "CHOL", "HDL", "LDLCALC", "TRIG", "CHOLHDL", "LDLDIRECT" in the last 72 hours. Thyroid Function Tests: No results for input(s): "TSH", "T4TOTAL", "FREET4", "T3FREE", "THYROIDAB" in the last 72 hours. Anemia Panel: No results for input(s): "VITAMINB12", "FOLATE", "FERRITIN", "TIBC", "IRON", "RETICCTPCT" in the last 72 hours. Sepsis Labs: No results for input(s): "PROCALCITON", "LATICACIDVEN" in the last 168 hours.  No results found for this or any previous visit (from the past 240 hour(s)).   Radiology Studies: No results found.  Scheduled Meds:  calcium carbonate  1 tablet Oral TID   melatonin  3 mg Oral QHS   polyethylene glycol  17 g Oral Daily   senna-docusate  1 tablet Oral BID   Continuous Infusions:  sodium chloride 10 mL/hr at 01/16/22 1718   sodium chloride 75 mL/hr at 01/20/22 1043    LOS: 12 days   Raiford Noble, DO [facility] Available via Epic secure chat 7am-7pm After these hours, please refer to coverage provider listed on [email or web address] 01/20/2022, 1:20 PM

## 2022-01-20 NOTE — Progress Notes (Signed)
Physical Therapy Treatment Patient Details Name: Chris Parker MRN: [id number] DOB: 22-Jul-1990 Today's Date: 01/20/2022   History of Present Illness 31 year old male found down on the ground on his right side for unclear duration of time.  He was found to have R UE compartment syndrome and is s/p an emergent R UE fasciotomy on 01/09/22, and  I&D on 01/11/22 and 01/13/22.    PT Comments    General Comments: pleasant and cooperative during session.  Pt lives with parents and woorks for his [place]. Pt was OOB in recliner.  Assisted with amb in hallway was difficult.  General transfer comment: heavy lean on L LE and L UE to push self up from recliner.  R UE remains in ACE wrap cast with elbow flexed and R LE pt is unable to fully WB due to c/o weakness/pain.  General Gait Details: heavy lean on [employer] with inability to tolerate >50% WBing R LE due to pain/weakness "funny feeling like it won't hold me", stated pt.  distance limited due to effort and MAX c/o fatigue.  Progressing slowly. Pt plans to return home with family.    Recommendations for follow up therapy are one component of a multi-disciplinary discharge planning process, led by the attending physician.  Recommendations may be updated based on patient status, additional functional criteria and insurance authorization.  Follow Up Recommendations  Outpatient PT     Assistance Recommended at Discharge Intermittent Supervision/Assistance  Patient can return home with the following A little help with walking and/or transfers;A little help with bathing/dressing/bathroom;Assistance with cooking/housework;Direct supervision/assist for medications management;Assist for transportation;Help with stairs or ramp for entrance   Equipment Recommendations  Cane Environmental education officer)    Recommendations for Other Services       Precautions / Restrictions Precautions Precautions: Fall Precaution Comments: Wound  VAC Restrictions Weight Bearing Restrictions: No RLE Weight Bearing: Weight bearing as tolerated Other Position/Activity Restrictions: assuming NWB RUE     Mobility  Bed Mobility               General bed mobility comments: OOB in recliner    Transfers Overall transfer level: Needs assistance Equipment used: None Transfers: Sit to/from Stand Sit to Stand: Min guard, Supervision           General transfer comment: heavy lean on L LE and L UE to push self up from recliner.  R UE remains in ACE wrap cast with elbow flexed and R LE pt is unable to fully WB due to c/o weakness/pain    Ambulation/Gait Ambulation/Gait assistance: Min assist, Mod assist Gait Distance (Feet): 45 Feet Assistive device: Quad cane Gait Pattern/deviations: Step-to pattern, Decreased stance time - right, Decreased step length - right, Decreased step length - left, Decreased dorsiflexion - right, Antalgic Gait velocity: decreased     General Gait Details: heavy lean on [employer] with inability to tolerate >50% WBing R LE due to pain/weakness "funny feeling like it won't hold me", stated pt.  distance limited due to effort and MAX c/o fatigue.  Progressing slowly.   Stairs             Wheelchair Mobility    Modified Rankin (Stroke Patients Only)       Balance  Cognition Arousal/Alertness: Awake/alert Behavior During Therapy: WFL for tasks assessed/performed Overall Cognitive Status: Within Functional Limits for tasks assessed                                 General Comments: pleasant and cooperative during session.  Pt lives with parents and woorks for his [place].        Exercises      General Comments        Pertinent Vitals/Pain Pain Assessment Pain Assessment: Faces Faces Pain Scale: Hurts little more Pain Location: R forearm "improving" Pain Descriptors / Indicators: Sore,  Tender Pain Intervention(s): Monitored during session, Repositioned    Home Living                          Prior Function            PT Goals (current goals can now be found in the care plan section) Progress towards PT goals: Progressing toward goals    Frequency    Min 3X/week      PT Plan Current plan remains appropriate    Co-evaluation              AM-PAC PT "6 Clicks" Mobility   Outcome Measure  Help needed turning from your back to your side while in a flat bed without using bedrails?: A Little Help needed moving from lying on your back to sitting on the side of a flat bed without using bedrails?: A Little Help needed moving to and from a bed to a chair (including a wheelchair)?: A Little Help needed standing up from a chair using your arms (e.g., wheelchair or bedside chair)?: A Little Help needed to walk in hospital room?: A Lot Help needed climbing 3-5 steps with a railing? : A Lot 6 Click Score: 16    End of Session Equipment Utilized During Treatment: Gait belt Activity Tolerance: Patient limited by fatigue;Patient limited by pain Patient left: in chair;with call bell/phone within reach Nurse Communication: Mobility status PT Visit Diagnosis: Difficulty in walking, not elsewhere classified (R26.2);Pain;Muscle weakness (generalized) (M62.81);Other abnormalities of gait and mobility (R26.89);Unsteadiness on feet (R26.81) Pain - Right/Left: Right Pain - part of body: Arm;Leg     Time: [id number] PT Time Calculation (min) (ACUTE ONLY): 14 min  Charges:  $Gait Training: 8-22 mins                     {Elward Nocera  PTA Acute  [employer] M-F          [phone] Weekend pager [phone]

## 2022-01-20 NOTE — Progress Notes (Signed)
RLE venous duplex has been completed.   Results can be found under chart review under CV PROC. 01/20/2022 3:25 PM Nataleigh Griffin RVT, RDMS

## 2022-01-21 DIAGNOSIS — M79A11 Nontraumatic compartment syndrome of right upper extremity: Secondary | ICD-10-CM | POA: Diagnosis not present

## 2022-01-21 DIAGNOSIS — R7989 Other specified abnormal findings of blood chemistry: Secondary | ICD-10-CM | POA: Diagnosis not present

## 2022-01-21 DIAGNOSIS — N179 Acute kidney failure, unspecified: Secondary | ICD-10-CM | POA: Diagnosis not present

## 2022-01-21 DIAGNOSIS — M6282 Rhabdomyolysis: Secondary | ICD-10-CM | POA: Diagnosis not present

## 2022-01-21 LAB — CBC WITH DIFFERENTIAL/PLATELET
Abs Immature Granulocytes: 0.08 10*3/uL — ABNORMAL HIGH (ref 0.00–0.07)
Basophils Absolute: 0 10*3/uL (ref 0.0–0.1)
Basophils Relative: 0 %
Eosinophils Absolute: 0.1 10*3/uL (ref 0.0–0.5)
Eosinophils Relative: 1 %
HCT: 25.5 % — ABNORMAL LOW (ref 39.0–52.0)
Hemoglobin: 8.5 g/dL — ABNORMAL LOW (ref 13.0–17.0)
Immature Granulocytes: 1 %
Lymphocytes Relative: 16 %
Lymphs Abs: 1.9 10*3/uL (ref 0.7–4.0)
MCH: 30.6 pg (ref 26.0–34.0)
MCHC: 33.3 g/dL (ref 30.0–36.0)
MCV: 91.7 fL (ref 80.0–100.0)
Monocytes Absolute: 1.1 10*3/uL — ABNORMAL HIGH (ref 0.1–1.0)
Monocytes Relative: 9 %
Neutro Abs: 8.8 10*3/uL — ABNORMAL HIGH (ref 1.7–7.7)
Neutrophils Relative %: 73 %
Platelets: 220 10*3/uL (ref 150–400)
RBC: 2.78 MIL/uL — ABNORMAL LOW (ref 4.22–5.81)
RDW: 12.9 % (ref 11.5–15.5)
WBC: 12.1 10*3/uL — ABNORMAL HIGH (ref 4.0–10.5)
nRBC: 0 % (ref 0.0–0.2)

## 2022-01-21 LAB — COMPREHENSIVE METABOLIC PANEL WITH GFR
ALT: 61 U/L — ABNORMAL HIGH (ref 0–44)
AST: 49 U/L — ABNORMAL HIGH (ref 15–41)
Albumin: 2.2 g/dL — ABNORMAL LOW (ref 3.5–5.0)
Alkaline Phosphatase: 60 U/L (ref 38–126)
Anion gap: 7 (ref 5–15)
BUN: 20 mg/dL (ref 6–20)
CO2: 27 mmol/L (ref 22–32)
Calcium: 7.6 mg/dL — ABNORMAL LOW (ref 8.9–10.3)
Chloride: 104 mmol/L (ref 98–111)
Creatinine, Ser: 1.23 mg/dL (ref 0.61–1.24)
GFR, Estimated: >60 mL/min
Glucose, Bld: 105 mg/dL — ABNORMAL HIGH (ref 70–99)
Potassium: 3.8 mmol/L (ref 3.5–5.1)
Sodium: 138 mmol/L (ref 135–145)
Total Bilirubin: 0.8 mg/dL (ref 0.3–1.2)
Total Protein: 5.3 g/dL — ABNORMAL LOW (ref 6.5–8.1)

## 2022-01-21 LAB — PHOSPHORUS: Phosphorus: 4.5 mg/dL (ref 2.5–4.6)

## 2022-01-21 LAB — CK: Total CK: 878 U/L — ABNORMAL HIGH (ref 49–397)

## 2022-01-21 LAB — MAGNESIUM: Magnesium: 1.6 mg/dL — ABNORMAL LOW (ref 1.7–2.4)

## 2022-01-21 MED ORDER — MAGNESIUM SULFATE 2 GM/50ML IV SOLN
2.0000 g | Freq: Once | INTRAVENOUS | Status: AC
  Administered 2022-01-21: 2 g via INTRAVENOUS
  Filled 2022-01-21: qty 50

## 2022-01-21 NOTE — Progress Notes (Signed)
Occupational Therapy Treatment Patient Details Name: Chris Parker MRN: [id number] DOB: [birth year] Today's Date: 01/21/2022   History of present illness 31 year old male found down on the ground on his right side for unclear duration of time.  He was found to have R UE compartment syndrome and is s/p an emergent R UE fasciotomy on 01/09/22, and  I&D on 01/11/22 and 01/13/22.   OT comments  OT educated the pt on elevating his R UE to assist edema management, R elbow and shoulder ROM to tolerance, compensatory techniques for upper and lower body dressing tasks, and potential use of a reacher to assist with lower body dressing. He provided teach back on some of the aforementioned. He was able to ambulate a short distance in his room using a quad cane in his L UE. He reported having R UE pain & still no ability to move all 5 of his R UE fingers. Overall, he presented with good effort and participation, and he is making gradual functional progress.    Recommendations for follow up therapy are one component of a multi-disciplinary discharge planning process, led by the attending physician.  Recommendations may be updated based on patient status, additional functional criteria and insurance authorization.    Follow Up Recommendations  Outpatient OT    Assistance Recommended at Discharge Intermittent Supervision/Assistance  Patient can return home with the following  A little help with walking and/or transfers;Assistance with cooking/housework;Assist for transportation;Help with stairs or ramp for entrance;A little help with bathing/dressing/bathroom   Equipment Recommendations  Tub/shower seat       Precautions / Restrictions Precautions Precaution Comments: Wound VAC Restrictions Weight Bearing Restrictions: No RLE Weight Bearing: Weight bearing as tolerated Other Position/Activity Restrictions: assuming NWB RUE       Mobility   Transfers Overall transfer level: Needs assistance Equipment  used: Quad cane Transfers: Sit to/from Stand Sit to Stand: Supervision                     ADL either performed or assessed with clinical judgement   ADL   Eating/Feeding: Set up Eating/Feeding Details (indicate cue type and reason): requires assist to open packets, remove lids, and cut food, due to acute R UE ROM and strength deficits; requires use of non-dominant L UE             Upper Body Dressing : Minimal assistance Upper Body Dressing Details (indicate cue type and reason): OT instructed the pt on implementing the hemi-technique for upper body dressing, given acute R UE ROM and strength deficits Lower Body Dressing: Minimal assistance;Cueing for compensatory techniques;With adaptive equipment Lower Body Dressing Details (indicate cue type and reason): OT educated the pt on implementing the one-handed technique for donning & doffing socks, as well as how to use a reacher to doff socks or donn lower body clothing articles such as underwear.  He performed teach back on implementing the one-handed technique for sock management while seated in the chair.                      Cognition Arousal/Alertness: Awake/alert Behavior During Therapy: WFL for tasks assessed/performed Overall Cognitive Status: Within Functional Limits for tasks assessed             Exercises  OT instructed the pt on R UE positioning, and implementing elbow and shoulder ROM to tolerance.             Pertinent Vitals/ Pain  Pain Assessment Pain Assessment: 0-10 Pain Score: 7  Pain Location: R UE Pain Intervention(s): Other (comment) (He reported recently receiving pain medication. R UE elevated on pillows at the end of the session)         Frequency  Min 2X/week        Progress Toward Goals  OT Goals(current goals can now be found in the care plan section)  Progress towards OT goals: Progressing toward goals  Acute Rehab OT Goals OT Goal Formulation: With  patient Time For Goal Achievement: 01/28/22 Potential to Achieve Goals: Good  Plan Discharge plan remains appropriate       AM-PAC OT "6 Clicks" Daily Activity     Outcome Measure   Help from another person eating meals?: None Help from another person taking care of personal grooming?: A Little Help from another person toileting, which includes using toliet, bedpan, or urinal?: A Little Help from another person bathing (including washing, rinsing, drying)?: A Little Help from another person to put on and taking off regular upper body clothing?: A Little Help from another person to put on and taking off regular lower body clothing?: A Little 6 Click Score: 19    End of Session Equipment Utilized During Treatment: Other (comment) (Quad cane)  OT Visit Diagnosis: Unsteadiness on feet (R26.81);Muscle weakness (generalized) (M62.81)   Activity Tolerance Patient tolerated treatment well   Patient Left in chair;with call bell/phone within reach   Nurse Communication  (Nurse cleared pt for participation in the session)        Time: 2947-6546 OT Time Calculation (min): 20 min  Charges: OT General Charges $OT Visit: 1 Visit OT Treatments $Self Care/Home Management : 8-22 mins    Reuben Likes, OTR/L 01/21/2022, 12:25 PM

## 2022-01-21 NOTE — Progress Notes (Signed)
PROGRESS NOTE    Chris Parker  [id number] DOB: 07-Mar-1991 DOA: 01/08/2022 PCP: Patient, No Pcp Per   Brief Narrative:  The patient 31-yo Caucasian male w/ history of IVDA, found down on the ground on his right side for unclear duration of time,reportedly with needle sticking out of right arm. In ED- in severe rhabdomyolysis w/ compartment syndrome  Rt UE  s/p OR by Dr. Tempie Donning- emergent fasciotomy RUE, placed on aggressive IVF,has SIRS criteria,but without overt underlying infectious source, cxr-right-sided airspace opacities ?asymmetric edema given that the patient was found on his right sidem procal and CTA ordere  placed onemperic broad-spectrum IV antibiotics with IV Vanco and cefepime. Also with hyperkalemia, s/p IV insulin and Lokelma, AKI creat 1.8, metabolci acidosis, transaminitis, w/ elevated troponin discussed w/ Dr. Debara Pickett, who felt that this elevation was less likely represent acute MI, but more likely on the basis of type II supply/demand mismatch- no heparin added but troponin trended and echocardiogram. ABG w/ 7.29/41/158/21. CT abd.pelvis, CT T L spine and CT Head done done in ED SHOWED: Inflammatory fat stranding along the anterior and lateral aspect of the lower right chest wall, consistent with the patient's history of recent trauma. Mild patchy right middle lobe and anterior right lower lobe airspace disease, which may represent ill-defined areas of pulmonary contusion. An underlying infectious versus inflammatory process cannot be excluded. Patchy opacities on rt lung-consistent w/ pneumonia No osseus findings on L/T spine   9/23: Due to ongoing pain underwent right dorsal forearm compartment release. Placed Foley 9/25: Irrigation and debridement of right upper extremity 01/13/2022 he underwent repeat debridement   His CK has been improving and all his fingers are warm and well-perfused with BCR in each finger.  Orthospine are removing his incisional wound VAC today and  applying a clean new dry dressing.  Given his right leg swelling a lower extremity venous duplex was ordered and did not show any evidence of DVT.  CK still little bit high but has not significantly improved at the time of admission and renal functions continue to improve slowly.   01/21/2022 orthopedic surgery has evaluated and took the incisional wound VAC down today and his volar wound was clean and dry and well approximated.  The wounds were redressed with Xeroform and bulky folded Curlex and an Ace wrap.  Orthopedic surgery discussed the importance of outpatient hand therapy to work on edema control and passive range of motion to prevent finger contractures.  They are recommending OT fabricated a removable dorsal blocking splint in intrinsic plus position while patient is in-house and recommended following up in outpatient setting within 2 weeks after final wound closure  Assessment and Plan:  Severe Rhabdomyolysis nontraumatic Compartment syndrome RUE w/ necrotic tissue on RUE: -Compartment syndrome with rhabdomyolysis due to prolonged laying on right side/unresponsive status in the setting of IVDA.  S/P emergent fasciotomy of forearm and hand 9/22 and 9/23 and repeat on 9/25 (Dr. Tempie Donning).  Underwent another I&D on 9/27. Still no movement in his right hand.  Some blistering is noted which is being addressed by orthopedics. Strength in the right lower extremity appears to be improving.  Has been able to ambulate.  Seen by physical therapy.  Outpatient therapy is recommended. CK level continues to improve and last CK was 878 Noted to have low-grade fever appears to have subsided.  WBC elevated but much better compared to last week.  Stable today.  Labs being done every 2 days. Orthopedics plans to remove the wound VAC  in the next few days after which hopefully patient should be able to go home. -Follow-up on Ortho recommendations   Acute kidney injury Due to rhabdomyolysis. No prior  labs. Nephrology was consulted to assist with management.  Renal function has improved.  Nephrology has signed off. Foley catheter has been discontinued.  Patient has been able to void. -Patient's BUN/creatinine has improved and gone from 48/2.29 is now trended down to 20/1.23 on last check -We will resume IV fluid hydration with normal saline at 75 MLS per hour CT scan of the abdomen pelvis did not show any hydronephrosis. -We will need to avoid further nephrotoxic medications, contrast dyes, hypotension and dehydration to ensure adequate renal perfusion Creatinine has been stable.  Continues to have good urine output. -We will follow-up CMP in a.m.   Hypocalcemia in the setting of hypoalbuminemia -Corrected calcium is stable   Hyponatremia Improved and now sodium is 138 on last check -Continue monitor and trend intermittently repeat CMP in a.m.   Metabolic acidosis w/ lactic acidosis -Lactic acid and bicarb level has normalized.   -Patient's CO2 is now 31, anion gap is 4, chloride level is 103   Potassium abnormalities, hyperkalemia followed by hypokalemia -Potassium level has improved and was 4.6.  Magnesium was 1.6 today and replete.     Hypomagnesemia -Patient's magnesium level is now 1.6 -Replete with IV mag sulfate 2 g -Continue to monitor and replete as necessary -Repeat mag level in a.m.   RUE/RLE weakness w/ decreased sensation  -CT head no acute findings CT L and T-spine no acute finding.   -Most likely weakness from compression neuropathy/rhabdomyolysis from laying on the right side,+/-drug use/unresponsive on floor. -Has regained strength in the right lower extremity but right lower extremity was a little swollen today so ordered a right lower extremity venous duplex and was negative for DVT.     Right sided pneumonia likely aspiration pneumonia/Sepsis POA: -Pro-Cal elevated 49, wbc I was elevated to 26,000 but is now 12.1 -imaging w/ rt sided pneumonia.  Blood  cultures no growth so far.   Vancomycin was discontinued.   Patient has completed 7-day course of cefepime. Respiratory status is stable and will need an ambulatory home O2 screen prior to discharge   Transaminitis Significantly high AST ALT likely due to rhabdomyolysis.  AST is now 49 and ALT is now 61.  Hepatitis C also contributing.  No acute abnormalities noted on ultrasound of the right upper quadrant. LFTs gradually improving.   Hepatitis C -Hep C antibody positive and viral load 275,000.  He will need follow-up with [facility] discharge.  This appears to be a new diagnosis.   High blood pressure no prior history of hypertension  -Pain is contributing to high blood pressure.  Even not too aggressive in treating it due to his renal dysfunction.  Continue to monitor for now.  May need to consider antihypertensives at discharge if his blood pressure is still elevated at that time.   Elevated troponin  Due to demand ischemia from rhabdomyolysis/compartment syndrome, trops [phone] 1200: Dr. Debara Pickett was discussed-no further recommendation, echo with EF 60 to 65%, no RWMA   IVDA  -Admits using fentanyl, urine drug screen negative.  -Continue substance abuse counseling   Acute metabolic encephalopathy -Likely multifactorial metabolic and toxic in the setting of IVDA, sepsis.  Mentation seems to be back to baseline.   Normocytic anemia -No evidence for overt bleeding.  Some blood loss with surgery as expected.   Slow drift down in  hemoglobin noted over the past several days.  No overt bleeding noted.  Continue to monitor. Anemia panel reviewed.  Ferritin 107, iron 61, TIBC 346, percent saturation 18, vitamin [id number], folic acid 7.2.  No clear deficiencies identified. -Continue monitor for signs and symptoms of bleeding; no overt bleeding noted and hemoglobin/hematocrit has been stable last few checks and is now 9.2/27.2 yesterday but slightly dropped to 8.5/25.5 today -Repeat CBC within 1  week   Scrotal edema Scrotal support has been ordered.  No erythema noted.  Testes are palpated bilaterally and noted to be normal without any tenderness.   -Continue scrotal elevation  DVT prophylaxis: SCDs Start: 01/08/22 2020    Code Status: Full Code Family Communication: No family present at bedside   Disposition Plan:  Level of care: Med-Surg Status is: Inpatient Remains inpatient appropriate because: Anticipating discharging home in the next 24 to 48 hours if medically stable and cleared by orthopedic surgery   Consultants:  Plastic surgery Orthopedic surgery Nephrology  Procedures:  As delineated as above  Antimicrobials:  Anti-infectives (From admission, onward)    Start     Dose/Rate Route Frequency Ordered Stop   01/11/22 2300  ceFEPIme (MAXIPIME) 2 g in sodium chloride 0.9 % 100 mL IVPB        2 g 200 mL/hr over 30 Minutes Intravenous Every 12 hours 01/11/22 1335 01/15/22 2245   01/09/22 1600  vancomycin (VANCOREADY) IVPB 1500 mg/300 mL  Status:  Discontinued        1,500 mg 150 mL/hr over 120 Minutes Intravenous Every 24 hours 01/09/22 0043 01/10/22 1304   01/09/22 0200  ceFEPIme (MAXIPIME) 2 g in sodium chloride 0.9 % 100 mL IVPB  Status:  Discontinued        2 g 200 mL/hr over 30 Minutes Intravenous Every 8 hours 01/09/22 0043 01/11/22 1335   01/08/22 1715  ceFEPIme (MAXIPIME) 2 g in sodium chloride 0.9 % 100 mL IVPB        2 g 200 mL/hr over 30 Minutes Intravenous  Once 01/08/22 1713 01/08/22 1819   01/08/22 1715  metroNIDAZOLE (FLAGYL) IVPB 500 mg        500 mg 100 mL/hr over 60 Minutes Intravenous  Once 01/08/22 1713 01/08/22 1833   01/08/22 1715  vancomycin (VANCOCIN) IVPB 1000 mg/200 mL premix        1,000 mg 200 mL/hr over 60 Minutes Intravenous  Once 01/08/22 1713 01/08/22 2019       Subjective: Seen and examined at bedside and he states that he is doing better and continues to have some pain but is doing better.  No nausea or vomiting.  States  he is feeling better and states his right leg is still little swollen but not as bad.  No chest pain or shortness of breath.  No other concerns or complaints at this time.  Objective: Vitals:   01/20/22 2029 01/21/22 0210 01/21/22 0505 01/21/22 1255  BP: 134/86  124/73 (!) 164/98  Pulse: 97  87 (!) 101  Resp: 18  18 17   Temp: (!) 101 F (38.3 C) 99 F (37.2 C) 98.3 F (36.8 C) 99.2 F (37.3 C)  TempSrc: Oral Oral Oral Oral  SpO2: 98%  100% 100%  Weight:      Height:        Intake/Output Summary (Last 24 hours) at 01/21/2022 1841 Last data filed at 01/21/2022 1800 Gross per 24 hour  Intake 3874.52 ml  Output 2260 ml  Net 1614.52  ml   Filed Weights   01/16/22 0515 01/17/22 0500 01/20/22 0421  Weight: 81.5 kg 80.4 kg 76.1 kg   Examination: Physical Exam:  Constitutional: WN/WD Caucasian male currently no acute distress Respiratory: Diminished to auscultation bilaterally with coarse breath sounds, no wheezing, rales, rhonchi or crackles. Normal respiratory effort and patient is not tachypenic. No accessory muscle use.  Cardiovascular: RRR, no murmurs / rubs / gallops. S1 and S2 auscultated.  Has mild right lower extremity swelling compared to left Abdomen: Soft, non-tender, non-distended. No masses palpated. N GU: Deferred. Musculoskeletal: No clubbing / cyanosis of digits/nails.  His right arm is wrapped and connected to wound VAC  Skin: Has some track marks from prior IV injections Neurologic: CN 2-12 grossly intact with no focal deficits. Romberg sign and cerebellar reflexes not assessed.  Psychiatric: Normal judgment and insight. Alert and oriented x 3. Normal mood and appropriate affect.   Data Reviewed: I have personally reviewed following labs and imaging studies  CBC: Recent Labs  Lab 01/15/22 0511 01/17/22 0516 01/19/22 0434 01/20/22 0826 01/21/22 0454  WBC 15.2* 14.1* 15.8* 14.0* 12.1*  NEUTROABS  --   --   --  10.6* 8.8*  HGB 9.7* 9.7* 9.2* 9.2* 8.5*  HCT  27.5* 28.2* 28.0* 27.2* 25.5*  MCV 88.4 90.1 92.7 91.6 91.7  PLT 247 274 241 247 [id number]   Basic Metabolic Panel: Recent Labs  Lab 01/15/22 0511 01/17/22 0516 01/19/22 0434 01/20/22 0826 01/21/22 0454  NA 134* 136 135 138 138  K 3.4* 4.3 4.2 4.6 3.8  CL 101 102 101 103 104  CO2 27 29 27 31 27   GLUCOSE 126* 121* 114* 115* 105*  BUN 49* 37* 27* 23* 20  CREATININE 1.69* 1.69* 1.56* 1.47* 1.23  CALCIUM 7.8* 7.9* 7.6* 7.8* 7.6*  MG 2.0  --  1.8 1.8 1.6*  PHOS  --   --   --  4.4 4.5   GFR: Estimated Creatinine Clearance: 89.4 mL/min (by C-G formula based on SCr of 1.23 mg/dL). Liver Function Tests: Recent Labs  Lab 01/15/22 0511 01/17/22 0516 01/19/22 0434 01/20/22 0826 01/21/22 0454  AST 151* 83* 65* 59* 49*  ALT 133* 94* 74* 71* 61*  ALKPHOS 73 70 63 67 60  BILITOT 0.8 1.0 0.8 0.7 0.8  PROT 5.0* 5.0* 5.0* 5.6* 5.3*  ALBUMIN 2.2* 2.1* 2.2* 2.3* 2.2*   No results for input(s): "LIPASE", "AMYLASE" in the last 168 hours. No results for input(s): "AMMONIA" in the last 168 hours. Coagulation Profile: No results for input(s): "INR", "PROTIME" in the last 168 hours. Cardiac Enzymes: Recent Labs  Lab 01/17/22 0516 01/19/22 0434 01/20/22 0826 01/21/22 0454  CKTOTAL 1,700* 1,115* 1,006* 878*   BNP (last 3 results) No results for input(s): "PROBNP" in the last 8760 hours. HbA1C: No results for input(s): "HGBA1C" in the last 72 hours. CBG: No results for input(s): "GLUCAP" in the last 168 hours. Lipid Profile: No results for input(s): "CHOL", "HDL", "LDLCALC", "TRIG", "CHOLHDL", "LDLDIRECT" in the last 72 hours. Thyroid Function Tests: No results for input(s): "TSH", "T4TOTAL", "FREET4", "T3FREE", "THYROIDAB" in the last 72 hours. Anemia Panel: No results for input(s): "VITAMINB12", "FOLATE", "FERRITIN", "TIBC", "IRON", "RETICCTPCT" in the last 72 hours. Sepsis Labs: No results for input(s): "PROCALCITON", "LATICACIDVEN" in the last 168 hours.  No results found for this  or any previous visit (from the past 240 hour(s)).   Radiology Studies: VAS [place] LOWER EXTREMITY VENOUS (DVT)  Result Date: 01/20/2022  Lower Venous DVT Study Patient  Name:  Chris Parker  Date of Exam:   01/20/2022 Medical Rec #: [id number]    Accession #:    [id number] Date of Birth: 29-Jan-1991   Patient Gender: M Patient Age:   31 years Exam [facility]  [facility] Procedure:      VAS [place] LOWER EXTREMITY VENOUS (DVT) Referring Phys: Raiford Noble --------------------------------------------------------------------------------  Indications: Pain, and weakness. Other Indications: Patient admitted with rhabdomyolysis after being found down                    at home. Comparison Study: No previous exams Performing Technologist: Jody Hill RVT, RDMS  Examination Guidelines: A complete evaluation includes B-mode imaging, spectral Doppler, color Doppler, and power Doppler as needed of all accessible portions of each vessel. Bilateral testing is considered an integral part of a complete examination. Limited examinations for reoccurring indications may be performed as noted. The reflux portion of the exam is performed with the patient in reverse Trendelenburg.  +---------+---------------+---------+-----------+----------+--------------+ RIGHT    CompressibilityPhasicitySpontaneityPropertiesThrombus Aging +---------+---------------+---------+-----------+----------+--------------+ CFV      Full           No       Yes                                 +---------+---------------+---------+-----------+----------+--------------+ SFJ      Full                                                        +---------+---------------+---------+-----------+----------+--------------+ FV Prox  Full           No       Yes                                 +---------+---------------+---------+-----------+----------+--------------+ FV Mid   Full           No       Yes                                  +---------+---------------+---------+-----------+----------+--------------+ FV DistalFull           No       Yes                                 +---------+---------------+---------+-----------+----------+--------------+ PFV      Full                                                        +---------+---------------+---------+-----------+----------+--------------+ POP      Full           No       Yes                                 +---------+---------------+---------+-----------+----------+--------------+ PTV      Full                                                        +---------+---------------+---------+-----------+----------+--------------+  PERO     Full                                                        +---------+---------------+---------+-----------+----------+--------------+   +----+---------------+---------+-----------+----------+--------------+ LEFTCompressibilityPhasicitySpontaneityPropertiesThrombus Aging +----+---------------+---------+-----------+----------+--------------+ CFV Full           No       Yes                                 +----+---------------+---------+-----------+----------+--------------+     Summary: RIGHT: - There is no evidence of deep vein thrombosis in the lower extremity.  - No cystic structure found in the popliteal fossa.  LEFT: - No evidence of common femoral vein obstruction.  *See table(s) above for measurements and observations. Electronically signed by Harold Barban MD on 01/20/2022 at 10:37:25 PM.    Final     Scheduled Meds:  calcium carbonate  1 tablet Oral TID   melatonin  3 mg Oral QHS   polyethylene glycol  17 g Oral Daily   senna-docusate  1 tablet Oral BID   Continuous Infusions:  sodium chloride 10 mL/hr at 01/16/22 1718   sodium chloride 75 mL/hr at 01/21/22 1041    LOS: 13 days   Raiford Noble, DO [facility] Available via Epic secure chat 7am-7pm After these hours, please refer to  coverage provider listed on [email or web address] 01/21/2022, 6:41 PM

## 2022-01-21 NOTE — Progress Notes (Signed)
Pt resting comfortably in bed side chair.  Pain seems to be improving.  Still w/ limited sensation in hand and no active motor function.  Gen: resting comfortably, NAD Pulm: Normal WOB on RA CV: Normal rate, BUE warm and well perfused RUE: Dressing clean and dry, incisions well approximated without any surrounding erythema or induration, no drainage, compartments soft and compressible, no active motor function in hand, limited sensation to light touch in hand, hand warm and well perfused w/ BCR in fingers.  31 yo M s/p right forearm and hand fasciotomies for compartment syndrome after being down for unknown length of time.  Has undergone multiple debridements and closure of all wounds.  - Incision wound vac taken down. Volar wound clean, dry, and well approximated.  No surrounding erythema and no drainage. - Wounds re-dressed w/ xerform, bulky folded kerlix, and an ace wrap - We discussed the importance of outpatient hand therapy to work on edema control and passive ROM of fingers to prevent contracture; OT can fabricate a removable dorsal blocking splint in intrinsic plus position while patient in house if he'll be here for a few days. - Can follow up with patient as an outpatient around two weeks after final wound closure  Sherilyn Cooter, M.D. [facility]

## 2022-01-22 ENCOUNTER — Inpatient Hospital Stay (HOSPITAL_COMMUNITY): Payer: Medicaid Other

## 2022-01-22 DIAGNOSIS — M6282 Rhabdomyolysis: Secondary | ICD-10-CM | POA: Diagnosis not present

## 2022-01-22 DIAGNOSIS — N179 Acute kidney failure, unspecified: Secondary | ICD-10-CM | POA: Diagnosis not present

## 2022-01-22 DIAGNOSIS — R7989 Other specified abnormal findings of blood chemistry: Secondary | ICD-10-CM | POA: Diagnosis not present

## 2022-01-22 DIAGNOSIS — M79A11 Nontraumatic compartment syndrome of right upper extremity: Secondary | ICD-10-CM | POA: Diagnosis not present

## 2022-01-22 LAB — COMPREHENSIVE METABOLIC PANEL
ALT: 55 U/L — ABNORMAL HIGH (ref 0–44)
AST: 44 U/L — ABNORMAL HIGH (ref 15–41)
Albumin: 2.2 g/dL — ABNORMAL LOW (ref 3.5–5.0)
Alkaline Phosphatase: 58 U/L (ref 38–126)
Anion gap: 5 (ref 5–15)
BUN: 15 mg/dL (ref 6–20)
CO2: 26 mmol/L (ref 22–32)
Calcium: 7.5 mg/dL — ABNORMAL LOW (ref 8.9–10.3)
Chloride: 105 mmol/L (ref 98–111)
Creatinine, Ser: 1.02 mg/dL (ref 0.61–1.24)
GFR, Estimated: >60 mL/min (ref >60)
Glucose, Bld: 107 mg/dL — ABNORMAL HIGH (ref 70–99)
Potassium: 3.8 mmol/L (ref 3.5–5.1)
Sodium: 136 mmol/L (ref 135–145)
Total Bilirubin: 0.7 mg/dL (ref 0.3–1.2)
Total Protein: 5.2 g/dL — ABNORMAL LOW (ref 6.5–8.1)

## 2022-01-22 LAB — MAGNESIUM: Magnesium: 1.5 mg/dL — ABNORMAL LOW (ref 1.7–2.4)

## 2022-01-22 LAB — URINALYSIS, ROUTINE W REFLEX MICROSCOPIC
Bilirubin Urine: NEGATIVE
Glucose, UA: 50 mg/dL — AB
Hgb urine dipstick: NEGATIVE
Ketones, ur: NEGATIVE mg/dL
Nitrite: NEGATIVE
Protein, ur: NEGATIVE mg/dL
Specific Gravity, Urine: 1.013 (ref 1.005–1.030)
pH: 6 (ref 5.0–8.0)

## 2022-01-22 LAB — CBC WITH DIFFERENTIAL/PLATELET
Abs Immature Granulocytes: 0.06 10*3/uL (ref 0.00–0.07)
Basophils Absolute: 0.1 10*3/uL (ref 0.0–0.1)
Basophils Relative: 1 %
Eosinophils Absolute: 0.1 10*3/uL (ref 0.0–0.5)
Eosinophils Relative: 1 %
HCT: 25.2 % — ABNORMAL LOW (ref 39.0–52.0)
Hemoglobin: 8.4 g/dL — ABNORMAL LOW (ref 13.0–17.0)
Immature Granulocytes: 1 %
Lymphocytes Relative: 17 %
Lymphs Abs: 1.7 10*3/uL (ref 0.7–4.0)
MCH: 30.7 pg (ref 26.0–34.0)
MCHC: 33.3 g/dL (ref 30.0–36.0)
MCV: 92 fL (ref 80.0–100.0)
Monocytes Absolute: 0.9 10*3/uL (ref 0.1–1.0)
Monocytes Relative: 9 %
Neutro Abs: 7.1 10*3/uL (ref 1.7–7.7)
Neutrophils Relative %: 71 %
Platelets: 207 10*3/uL (ref 150–400)
RBC: 2.74 MIL/uL — ABNORMAL LOW (ref 4.22–5.81)
RDW: 12.9 % (ref 11.5–15.5)
WBC: 9.8 10*3/uL (ref 4.0–10.5)
nRBC: 0 % (ref 0.0–0.2)

## 2022-01-22 LAB — PHOSPHORUS: Phosphorus: 3.9 mg/dL (ref 2.5–4.6)

## 2022-01-22 LAB — CK: Total CK: 786 U/L — ABNORMAL HIGH (ref 49–397)

## 2022-01-22 MED ORDER — SODIUM CHLORIDE 0.9 % IV SOLN
100.0000 mg | Freq: Two times a day (BID) | INTRAVENOUS | Status: DC
  Administered 2022-01-22 (×2): 100 mg via INTRAVENOUS
  Filled 2022-01-22 (×3): qty 100

## 2022-01-22 MED ORDER — MAGNESIUM SULFATE 4 GM/100ML IV SOLN
4.0000 g | Freq: Once | INTRAVENOUS | Status: AC
  Administered 2022-01-22: 4 g via INTRAVENOUS
  Filled 2022-01-22: qty 100

## 2022-01-22 NOTE — TOC Progression Note (Signed)
Transition of Care [facility]) - Progression Note    Patient Details  Name: Alain Deschene MRN: [id number] Date of Birth: 1990-08-01  Transition of Care [facility]) CM/SW Contact  Lennart Pall, LCSW Phone Number: 01/22/2022, 9:58 AM  Clinical Narrative:     Alerted by PT of need for small base quad cane and pt agreeable - order placed with [employer] and this has been delivered to his room.  Expected Discharge Plan: Home/Self Care (vs. with HH) Barriers to Discharge: Continued Medical Work up  Expected Discharge Plan and Services Expected Discharge Plan: Home/Self Care (vs. with HH) In-house Referral: Clinical Social Work     Living arrangements for the past 2 months: Single Family Home                                       Social Determinants of Health (SDOH) Interventions    Readmission Risk Interventions     No data to display

## 2022-01-22 NOTE — TOC Transition Note (Signed)
Transition of Care [facility]) - CM/SW Discharge Note   Patient Details  Name: Chris Parker MRN: [id number] Date of Birth: 1991/03/07  Transition of Care [facility]) CM/SW Contact:  Lennart Pall, LCSW Phone Number: 01/22/2022, 4:53 PM   Clinical Narrative:    Spoke with pt and mother today and they are anticipating dc over weekend.  We reviewed that SA resource info has been placed on AVS.  Also, discussed recommendations for OPPT/OT per Dr. Tempie Donning.  Have spoken with MD who report pt should be able to be followed for therapy at an [facility] facility and they will contact with appointment info.  [facility] contact info placed on AVS as well.  No further TOC needs at this point.   Final next level of care: OP Rehab Barriers to Discharge: Continued Medical Work up   Patient Goals and CMS Choice Patient states their goals for this hospitalization and ongoing recovery are:: return home with mother; begin SA treament when mobility better      Discharge Placement                       Discharge Plan and Services In-house Referral: Clinical Social Work              DME Arranged: Other see comment (small base quad cane) DME Agency: [employer] Date DME Agency Contacted: 01/21/22   Representative spoke with at DME Agency: Andee Poles            Social Determinants of Health ([place]) Interventions     Readmission Risk Interventions     No data to display

## 2022-01-22 NOTE — Progress Notes (Signed)
PROGRESS NOTE    Chris Parker  GHW:[id number] DOB: Jan 04, 1991 DOA: 01/08/2022 PCP: Patient, No Pcp Per   Brief Narrative:  The patient 30-yo Caucasian male w/ history of IVDA, found down on the ground on his right side for unclear duration of time,reportedly with needle sticking out of right arm. In ED- in severe rhabdomyolysis w/ compartment syndrome  Rt UE  s/p OR by Dr. Frazier Butt- emergent fasciotomy RUE, placed on aggressive IVF,has SIRS criteria,but without overt underlying infectious source, cxr-right-sided airspace opacities ?asymmetric edema given that the patient was found on his right sidem procal and CTA ordere  placed onemperic broad-spectrum IV antibiotics with IV Vanco and cefepime. Also with hyperkalemia, s/p IV insulin and Lokelma, AKI creat 1.8, metabolci acidosis, transaminitis, w/ elevated troponin discussed w/ Dr. Rennis Golden, who felt that this elevation was less likely represent acute MI, but more likely on the basis of type II supply/demand mismatch- no heparin added but troponin trended and echocardiogram. ABG w/ 7.29/41/158/21. CT abd.pelvis, CT T L spine and CT Head done done in ED SHOWED: Inflammatory fat stranding along the anterior and lateral aspect of the lower right chest wall, consistent with the patient's history of recent trauma. Mild patchy right middle lobe and anterior right lower lobe airspace disease, which may represent ill-defined areas of pulmonary contusion. An underlying infectious versus inflammatory process cannot be excluded. Patchy opacities on rt lung-consistent w/ pneumonia No osseus findings on L/T spine   9/23: Due to ongoing pain underwent right dorsal forearm compartment release. Placed Foley 9/25: Irrigation and debridement of right upper extremity 01/13/2022 he underwent repeat debridement   His CK has been improving and all his fingers are warm and well-perfused with BCR in each finger.  Orthospine are removing his incisional wound VAC today and  applying a clean new dry dressing.  Given his right leg swelling a lower extremity venous duplex was ordered and did not show any evidence of DVT.  CK still little bit high but has not significantly improved at the time of admission and renal functions continue to improve slowly.   01/21/2022 orthopedic surgery has evaluated and took the incisional wound VAC down today and his volar wound was clean and dry and well approximated.  The wounds were redressed with Xeroform and bulky folded Curlex and an Ace wrap.  Orthopedic surgery discussed the importance of outpatient hand therapy to work on edema control and passive range of motion to prevent finger contractures.  They are recommending OT fabricated a removable dorsal blocking splint in intrinsic plus position while patient is in-house and recommended following up in outpatient setting within 2 weeks after final wound closure  Yesterday overnight patient spiked a temperature of 102.9.  We will continue monitor his temperature curve and improvement started on doxycycline for bronchitis.  We will need to ensure he is afebrile for least 24 hours prior to safe discharge disposition.   Assessment and Plan:  Severe Rhabdomyolysis nontraumatic Compartment syndrome RUE w/ necrotic tissue on RUE: -Compartment syndrome with rhabdomyolysis due to prolonged laying on right side/unresponsive status in the setting of IVDA.  S/P emergent fasciotomy of forearm and hand 9/22 and 9/23 and repeat on 9/25 (Dr. Frazier Butt).  Underwent another I&D on 9/27. Still no movement in his right hand.  Some blistering is noted which is being addressed by orthopedics. Strength in the right lower extremity appears to be improving.  Has been able to ambulate.  Seen by physical therapy.  Outpatient therapy is recommended. CK level continues  to improve and last CK was 786 Noted to have low-grade fever appears to have subsided.  WBC elevated but much better compared to last week.  Stable  today.  Labs being done every 2 days. Orthopedics plans to remove the wound VAC in the next few days after which hopefully patient should be able to go home. -Follow-up on Ortho recommendations   Acute kidney injury Due to rhabdomyolysis. No prior labs. Nephrology was consulted to assist with management.  Renal function has improved.  Nephrology has signed off. Foley catheter has been discontinued.  Patient has been able to void. -Patient's BUN/creatinine has improved and gone from 48/2.29 is now trended down to 15/1.02 on last check -We will resume IV fluid hydration with normal saline at 75 MLS per hour -CT scan of the abdomen pelvis did not show any hydronephrosis. -We will need to avoid further nephrotoxic medications, contrast dyes, hypotension and dehydration to ensure adequate renal perfusion Creatinine has been stable.  Continues to have good urine output. -We will follow-up CMP in a.m.   Hypocalcemia in the setting of hypoalbuminemia -Corrected calcium is stable   Hyponatremia Improved and now sodium is 136 on last check -Continue monitor and trend intermittently repeat CMP in a.m.   Metabolic acidosis w/ lactic acidosis -Lactic acid and bicarb level has normalized.   -Patient's CO2 is now 26, anion gap is 5, chloride level is 105 -To monitor and trend and repeat CMP in the a.m.   Potassium abnormalities, hyperkalemia followed by hypokalemia -Potassium level and was 3.8.  Mag level was 1.5 -To monitor and replete as necessary   Hypomagnesemia -Patient's magnesium level is now 1.6 -Replete with IV mag sulfate 4 g -Continue to monitor and replete as necessary -Repeat mag level in a.m.   RUE/RLE weakness w/ decreased sensation  -CT head no acute findings CT L and T-spine no acute finding.   -Most likely weakness from compression neuropathy/rhabdomyolysis from laying on the right side,+/-drug use/unresponsive on floor. -Has regained strength in the right lower extremity  but right lower extremity was a little swollen today so ordered a right lower extremity venous duplex and was negative for DVT.     Right sided pneumonia likely aspiration pneumonia/Sepsis POA: -Pro-Cal elevated 49, wbc I was elevated to 26,000 but is now 9.8 and normalized  -He spiked a temperature of 102.9 yesterday and unclear etiology -imaging w/ rt sided pneumonia.  Blood cultures no growth so far.  Vancomycin was discontinued.   -Patient has completed 7-day course of cefepime. -Respiratory status is stable and will need an ambulatory home -O2 screen prior to discharge -Initiate flutter valve and incentive spirometry -Repeat chest x-ray today showed "Central bronchitis, with no other evidence of acute chest process." -We will place him on empirically on doxycycline  Fever -Unclear etiology -Panculture, obtain blood cultures x2, urinalysis and chest x-ray -Empirically start doxycycline IV twice daily -Chest x-ray as above -Follow blood cultures x2 -Urinalysis done and showed clear appearance with 50 glucose, negative hemoglobin, negative ketones, small leukocytes, negative nitrites, pH of 6.0, and there is rare bacteria with 21-50 WBCs -Continue monitor temperature curve and WBC curve   Transaminitis Significantly high AST ALT likely due to rhabdomyolysis.  AST is now 44 and ALT is now 55.  Hepatitis C also contributing.  No acute abnormalities noted on ultrasound of the right upper quadrant. LFTs gradually improving.   Hepatitis C -Hep C antibody positive and viral load 275,000.  He will need follow-up with [facility]  discharge.  This appears to be a new diagnosis.   High blood pressure no prior history of hypertension  -Pain is contributing to high blood pressure.  Even not too aggressive in treating it due to his renal dysfunction.  Continue to monitor for now.  May need to consider antihypertensives at discharge if his blood pressure is still elevated at that time. -Last BP reading  was 152/99   Elevated troponin  Due to demand ischemia from rhabdomyolysis/compartment syndrome, trops [phone]: Dr. Debara Pickett was discussed-no further recommendation, echo with EF 60 to 65%, no RWMA   IVDA  -Admits using fentanyl, urine drug screen negative.  -Continue substance abuse counseling   Acute metabolic encephalopathy -Likely multifactorial metabolic and toxic in the setting of IVDA, sepsis.  Mentation seems to be back to baseline.   Normocytic anemia -No evidence for overt bleeding.  Some blood loss with surgery as expected.   Slow drift down in hemoglobin noted over the past several days.  No overt bleeding noted.  Continue to monitor. Anemia panel reviewed.  Ferritin 107, iron 61, TIBC 346, percent saturation 18, vitamin [id number], folic acid 7.2.  No clear deficiencies identified. -Continue monitor for signs and symptoms of bleeding; no overt bleeding noted and hemoglobin/hematocrit has been stable last few checks and is now 9.2/27.2-> 8.5/25.5 -> 8.4/25.2 today -Repeat CBC within 1 week  Hypomagnesemia -Patient's mag level is now 1.5 -Replete with IV mag sulfate 4 grams -Continue monitor and replete as necessary -Repeat Mag Level in the in a.m.   Scrotal edema Scrotal support has been ordered.  No erythema noted.  Testes are palpated bilaterally and noted to be normal without any tenderness.   -Continue scrotal elevation  DVT prophylaxis: SCDs Start: 01/08/22 2020    Code Status: Full Code Family Communication: No family currently at bedside  Disposition Plan:  Level of care: Med-Surg Status is: Inpatient Remains inpatient appropriate because: Spiked a temperature overnight and needs to be afebrile for least 24 hours prior to safe discharge disposition   Consultants:  Plastic surgery Orthopedic surgery Nephrology  Procedures:  As delineated as above  Antimicrobials:  Anti-infectives (From admission, onward)    Start     Dose/Rate Route Frequency  Ordered Stop   01/22/22 1100  doxycycline (VIBRAMYCIN) 100 mg in sodium chloride 0.9 % 250 mL IVPB        100 mg 125 mL/hr over 120 Minutes Intravenous Every 12 hours 01/22/22 0959     01/11/22 2300  ceFEPIme (MAXIPIME) 2 g in sodium chloride 0.9 % 100 mL IVPB        2 g 200 mL/hr over 30 Minutes Intravenous Every 12 hours 01/11/22 1335 01/15/22 2245   01/09/22 1600  vancomycin (VANCOREADY) IVPB 1500 mg/300 mL  Status:  Discontinued        1,500 mg 150 mL/hr over 120 Minutes Intravenous Every 24 hours 01/09/22 0043 01/10/22 1304   01/09/22 0200  ceFEPIme (MAXIPIME) 2 g in sodium chloride 0.9 % 100 mL IVPB  Status:  Discontinued        2 g 200 mL/hr over 30 Minutes Intravenous Every 8 hours 01/09/22 0043 01/11/22 1335   01/08/22 1715  ceFEPIme (MAXIPIME) 2 g in sodium chloride 0.9 % 100 mL IVPB        2 g 200 mL/hr over 30 Minutes Intravenous  Once 01/08/22 1713 01/08/22 1819   01/08/22 1715  metroNIDAZOLE (FLAGYL) IVPB 500 mg        500 mg  100 mL/hr over 60 Minutes Intravenous  Once 01/08/22 1713 01/08/22 1833   01/08/22 1715  vancomycin (VANCOCIN) IVPB 1000 mg/200 mL premix        1,000 mg 200 mL/hr over 60 Minutes Intravenous  Once 01/08/22 1713 01/08/22 2019        Subjective: Seen and examined at bedside and he spiked a temperature yesterday.  Thinks he is doing better.  Wound VAC was removed.  No nausea or vomiting.  Feels okay.  Denies any urinary discomfort and thinks his arm is getting better.  No other concerns or complaints this time.  Objective: Vitals:   01/22/22 0332 01/22/22 0610 01/22/22 0844 01/22/22 1132  BP:  (!) 145/79 (!) 163/86 (!) 152/99  Pulse:  99 91 100  Resp:  17 18 18   Temp:  98.9 F (37.2 C) 98.1 F (36.7 C) 99.2 F (37.3 C)  TempSrc:  Oral    SpO2:  98% 99% 99%  Weight: 78.9 kg     Height:        Intake/Output Summary (Last 24 hours) at 01/22/2022 1628 Last data filed at 01/22/2022 1133 Gross per 24 hour  Intake 1199.07 ml  Output 1375 ml   Net -175.93 ml   Filed Weights   01/17/22 0500 01/20/22 0421 01/22/22 0332  Weight: 80.4 kg 76.1 kg 78.9 kg   Examination: Physical Exam:  Constitutional: WN/WD Caucasian male currently no acute distress Respiratory: Diminished to auscultation bilaterally, no wheezing, rales, rhonchi or crackles. Normal respiratory effort and patient is not tachypenic. No accessory muscle use.  Unlabored breathing Cardiovascular: RRR, no murmurs / rubs / gallops. S1 and S2 auscultated.  Has mild right lower extremity swelling compared to left Abdomen: Soft, non-tender, non-distended.  Bowel sounds positive.  GU: Deferred. Musculoskeletal: No clubbing / cyanosis of digits/nails. No joint deformity upper and lower extremities.  Skin: No rashes, lesions, ulcers on limited skin evaluation. No induration; Warm and dry.  Neurologic: CN 2-12 grossly intact with no focal deficits. Romberg sign and cerebellar reflexes not assessed.  Psychiatric: Normal judgment and insight. Alert and oriented x 3. Normal mood and appropriate affect.   Data Reviewed: I have personally reviewed following labs and imaging studies  CBC: Recent Labs  Lab 01/17/22 0516 01/19/22 0434 01/20/22 0826 01/21/22 0454 01/22/22 0446  WBC 14.1* 15.8* 14.0* 12.1* 9.8  NEUTROABS  --   --  10.6* 8.8* 7.1  HGB 9.7* 9.2* 9.2* 8.5* 8.4*  HCT 28.2* 28.0* 27.2* 25.5* 25.2*  MCV 90.1 92.7 91.6 91.7 92.0  PLT 274 241 247 220 [id number]   Basic Metabolic Panel: Recent Labs  Lab 01/17/22 0516 01/19/22 0434 01/20/22 0826 01/21/22 0454 01/22/22 0446  NA 136 135 138 138 136  K 4.3 4.2 4.6 3.8 3.8  CL 102 101 103 104 105  CO2 29 27 31 27 26   GLUCOSE 121* 114* 115* 105* 107*  BUN 37* 27* 23* 20 15  CREATININE 1.69* 1.56* 1.47* 1.23 1.02  CALCIUM 7.9* 7.6* 7.8* 7.6* 7.5*  MG  --  1.8 1.8 1.6* 1.5*  PHOS  --   --  4.4 4.5 3.9   GFR: Estimated Creatinine Clearance: 107.8 mL/min (by C-G formula based on SCr of 1.02 mg/dL). Liver Function  Tests: Recent Labs  Lab 01/17/22 0516 01/19/22 0434 01/20/22 0826 01/21/22 0454 01/22/22 0446  AST 83* 65* 59* 49* 44*  ALT 94* 74* 71* 61* 55*  ALKPHOS 70 63 67 60 58  BILITOT 1.0 0.8 0.7 0.8 0.7  PROT 5.0* 5.0* 5.6* 5.3* 5.2*  ALBUMIN 2.1* 2.2* 2.3* 2.2* 2.2*   No results for input(s): "LIPASE", "AMYLASE" in the last 168 hours. No results for input(s): "AMMONIA" in the last 168 hours. Coagulation Profile: No results for input(s): "INR", "PROTIME" in the last 168 hours. Cardiac Enzymes: Recent Labs  Lab 01/17/22 0516 01/19/22 0434 01/20/22 0826 01/21/22 0454 01/22/22 0446  CKTOTAL 1,700* 1,115* 1,006* 878* 786*   BNP (last 3 results) No results for input(s): "PROBNP" in the last 8760 hours. HbA1C: No results for input(s): "HGBA1C" in the last 72 hours. CBG: No results for input(s): "GLUCAP" in the last 168 hours. Lipid Profile: No results for input(s): "CHOL", "HDL", "LDLCALC", "TRIG", "CHOLHDL", "LDLDIRECT" in the last 72 hours. Thyroid Function Tests: No results for input(s): "TSH", "T4TOTAL", "FREET4", "T3FREE", "THYROIDAB" in the last 72 hours. Anemia Panel: No results for input(s): "VITAMINB12", "FOLATE", "FERRITIN", "TIBC", "IRON", "RETICCTPCT" in the last 72 hours. Sepsis Labs: No results for input(s): "PROCALCITON", "LATICACIDVEN" in the last 168 hours.  No results found for this or any previous visit (from the past 240 hour(s)).   Radiology Studies: DG CHEST PORT 1 VIEW  Result Date: 01/22/2022 CLINICAL DATA:  [id number].  Fever. EXAM: PORTABLE CHEST 1 VIEW COMPARISON:  Portable chest 01/09/2022 FINDINGS: 4:50 a.m. Right upper and middle lobe infiltrates on the prior study appear resolved. Bronchial thickening is noted centrally consistent with bronchitis but no focal lung infiltrate is seen. Heart size and vasculature are normal with stable mediastinum. Unremarkable osseous structures. IMPRESSION: Central bronchitis, with no other evidence of acute chest  process. Electronically Signed   By: Telford Nab M.D.   On: 01/22/2022 06:28    Scheduled Meds:  calcium carbonate  1 tablet Oral TID   melatonin  3 mg Oral QHS   polyethylene glycol  17 g Oral Daily   senna-docusate  1 tablet Oral BID   Continuous Infusions:  sodium chloride 10 mL/hr at 01/16/22 1718   sodium chloride 75 mL/hr at 01/22/22 1612   doxycycline (VIBRAMYCIN) IV 100 mg (01/22/22 1218)    LOS: 14 days   Raiford Noble, DO [facility] Available via Epic secure chat 7am-7pm After these hours, please refer to coverage provider listed on [email or web address] 01/22/2022, 4:28 PM

## 2022-01-22 NOTE — Progress Notes (Signed)
Physical Therapy Treatment Patient Details Name: Chris Parker MRN: [id number] DOB: 09/24/1990 Today's Date: 01/22/2022   History of Present Illness 31 year old male found down on the ground on his right side for unclear duration of time.  He was found to have R UE compartment syndrome and is s/p an emergent R UE fasciotomy on 01/09/22, and  I&D on 01/11/22 and 01/13/22.    PT Comments    Pt received supine in bed agreeable to seen. Pt required supervision for bed mobility and transfers with use of quad cane for support. Pt required min guard for ambulation with quad cane 184ft with one standing rest break, pt with highly antalgic gait pattern and fatigues quickly, remains a fall risk. Reviewed BLE exercises and pt demonstrated good form. Encouraged pt to ambulate with nursing staff outside of therapy session if possible, RN agreeable. Pt agreeable to OPPT and has necessary transportation. We will continue to follow acutely.   Recommendations for follow up therapy are one component of a multi-disciplinary discharge planning process, led by the attending physician.  Recommendations may be updated based on patient status, additional functional criteria and insurance authorization.  Follow Up Recommendations  Outpatient PT     Assistance Recommended at Discharge Intermittent Supervision/Assistance  Patient can return home with the following A little help with walking and/or transfers;A little help with bathing/dressing/bathroom;Assistance with cooking/housework;Direct supervision/assist for medications management;Assist for transportation;Help with stairs or ramp for entrance   Equipment Recommendations  Cane (small base [employer])    Recommendations for Other Services       Precautions / Restrictions Precautions Precautions: Fall Restrictions Weight Bearing Restrictions: No RLE Weight Bearing: Weight bearing as tolerated Other Position/Activity Restrictions: assuming NWB RUE      Mobility  Bed Mobility Overal bed mobility: Needs Assistance Bed Mobility: Supine to Sit, Sit to Supine     Supine to sit: Supervision, HOB elevated     General bed mobility comments: For safety only, no physical assist required    Transfers Overall transfer level: Needs assistance Equipment used: Quad cane Transfers: Sit to/from Stand Sit to Stand: Supervision           General transfer comment: heavy lean on L LE and L UE to push self up from EOB.  R UE remains in ACE wrap cast with elbow flexed and R LE pt is unable to fully WB due to c/o weakness/pain, holds RLE into high degree of ER, pt unable to DF and reports decreased sensation    Ambulation/Gait Ambulation/Gait assistance: Min guard Gait Distance (Feet): 100 Feet Assistive device: Quad cane Gait Pattern/deviations: Step-to pattern, Decreased stance time - right, Decreased step length - right, Decreased step length - left, Decreased dorsiflexion - right, Antalgic Gait velocity: decreased     General Gait Details: Pt ambualted with [employer] and min guard, no physical assist required, VCs for shortening step length and for 3-point gait pattern. Pt with strong L lateral lean onto [employer] with inability to tolerate >50% WBing R LE due to pain/weakness "funny feeling like it won't hold me", stated pt.  distance limited due to effort and MAX c/o fatigue, pt with standing rest break at window reporting mild dizziness and SOB.  Progressing slowly.   Stairs             Wheelchair Mobility    Modified Rankin (Stroke Patients Only)       Balance Overall balance assessment: Needs assistance Sitting-balance support: Feet supported, No upper extremity supported  Sitting balance-Leahy Scale: Good     Standing balance support: During functional activity, Reliant on assistive device for balance, Single extremity supported Standing balance-Leahy Scale: Fair Standing balance comment: relies on UE support for  transfer                            Cognition Arousal/Alertness: Awake/alert Behavior During Therapy: WFL for tasks assessed/performed Overall Cognitive Status: Within Functional Limits for tasks assessed                                          Exercises Total Joint Exercises Ankle Circles/Pumps: AROM, Both, 20 reps Short Arc Quad: AROM, Right, 10 reps, Supine Heel Slides: AROM, Right, 10 reps, Supine Straight Leg Raises: AROM, Right, 10 reps, Seated Long Arc Quad: AROM, Right, 10 reps, Seated    General Comments        Pertinent Vitals/Pain Pain Assessment Pain Assessment: 0-10 Pain Score: 4  Faces Pain Scale: Hurts little more Breathing: normal Negative Vocalization: none Facial Expression: smiling or inexpressive Body Language: relaxed Consolability: no need to console PAINAD Score: 0 Pain Location: R UE Pain Descriptors / Indicators: Sore, Tender Pain Intervention(s): Limited activity within patient's tolerance, Monitored during session, Repositioned, Other (comment) (elevated RUE on pillows at end of session)    Home Living                          Prior Function            PT Goals (current goals can now be found in the care plan section) Acute Rehab PT Goals 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 PT Goal Formulation: With patient Time For Goal Achievement: 01/28/22 Potential to Achieve Goals: Good Progress towards PT goals: Progressing toward goals    Frequency    Min 3X/week      PT Plan Current plan remains appropriate    Co-evaluation              AM-PAC PT "6 Clicks" Mobility   Outcome Measure  Help needed turning from your back to your side while in a flat bed without using bedrails?: A Little Help needed moving from lying on your back to sitting on the side of a flat bed without using bedrails?: A Little Help needed moving to and from a bed to a chair (including a  wheelchair)?: A Little Help needed standing up from a chair using your arms (e.g., wheelchair or bedside chair)?: A Little Help needed to walk in hospital room?: A Lot Help needed climbing 3-5 steps with a railing? : A Lot 6 Click Score: 16    End of Session Equipment Utilized During Treatment: Gait belt Activity Tolerance: Patient limited by fatigue;Patient limited by pain Patient left: in chair;with call bell/phone within reach Nurse Communication: Mobility status PT Visit Diagnosis: Difficulty in walking, not elsewhere classified (R26.2);Pain;Muscle weakness (generalized) (M62.81);Other abnormalities of gait and mobility (R26.89);Unsteadiness on feet (R26.81) Pain - Right/Left: Right Pain - part of body: Arm;Leg     Time: 0177-9390 PT Time Calculation (min) (ACUTE ONLY): 23 min  Charges:  $Gait Training: 8-22 mins $Therapeutic Exercise: 8-22 mins                     Jamesetta Geralds, PT, DPT [facility] Department Office:  [phone] Weekend pager: [phone]   Jamesetta Geralds 01/22/2022, 11:30 AM

## 2022-01-23 DIAGNOSIS — N179 Acute kidney failure, unspecified: Secondary | ICD-10-CM | POA: Diagnosis not present

## 2022-01-23 DIAGNOSIS — R7989 Other specified abnormal findings of blood chemistry: Secondary | ICD-10-CM | POA: Diagnosis not present

## 2022-01-23 DIAGNOSIS — M79A11 Nontraumatic compartment syndrome of right upper extremity: Secondary | ICD-10-CM | POA: Diagnosis not present

## 2022-01-23 DIAGNOSIS — R509 Fever, unspecified: Secondary | ICD-10-CM

## 2022-01-23 DIAGNOSIS — M6282 Rhabdomyolysis: Secondary | ICD-10-CM | POA: Diagnosis not present

## 2022-01-23 LAB — COMPREHENSIVE METABOLIC PANEL
ALT: 53 U/L — ABNORMAL HIGH (ref 0–44)
AST: 44 U/L — ABNORMAL HIGH (ref 15–41)
Albumin: 2.3 g/dL — ABNORMAL LOW (ref 3.5–5.0)
Alkaline Phosphatase: 62 U/L (ref 38–126)
Anion gap: 3 — ABNORMAL LOW (ref 5–15)
BUN: 13 mg/dL (ref 6–20)
CO2: 26 mmol/L (ref 22–32)
Calcium: 7.4 mg/dL — ABNORMAL LOW (ref 8.9–10.3)
Chloride: 105 mmol/L (ref 98–111)
Creatinine, Ser: 0.86 mg/dL (ref 0.61–1.24)
GFR, Estimated: >60 mL/min (ref >60)
Glucose, Bld: 107 mg/dL — ABNORMAL HIGH (ref 70–99)
Potassium: 3.4 mmol/L — ABNORMAL LOW (ref 3.5–5.1)
Sodium: 134 mmol/L — ABNORMAL LOW (ref 135–145)
Total Bilirubin: 0.8 mg/dL (ref 0.3–1.2)
Total Protein: 5.4 g/dL — ABNORMAL LOW (ref 6.5–8.1)

## 2022-01-23 LAB — CBC WITH DIFFERENTIAL/PLATELET
Abs Immature Granulocytes: 0.04 10*3/uL (ref 0.00–0.07)
Basophils Absolute: 0.1 10*3/uL (ref 0.0–0.1)
Basophils Relative: 1 %
Eosinophils Absolute: 0.1 10*3/uL (ref 0.0–0.5)
Eosinophils Relative: 1 %
HCT: 24.5 % — ABNORMAL LOW (ref 39.0–52.0)
Hemoglobin: 7.9 g/dL — ABNORMAL LOW (ref 13.0–17.0)
Immature Granulocytes: 0 %
Lymphocytes Relative: 17 %
Lymphs Abs: 1.6 10*3/uL (ref 0.7–4.0)
MCH: 29.7 pg (ref 26.0–34.0)
MCHC: 32.2 g/dL (ref 30.0–36.0)
MCV: 92.1 fL (ref 80.0–100.0)
Monocytes Absolute: 0.7 10*3/uL (ref 0.1–1.0)
Monocytes Relative: 8 %
Neutro Abs: 7 10*3/uL (ref 1.7–7.7)
Neutrophils Relative %: 73 %
Platelets: 189 10*3/uL (ref 150–400)
RBC: 2.66 MIL/uL — ABNORMAL LOW (ref 4.22–5.81)
RDW: 12.6 % (ref 11.5–15.5)
WBC: 9.5 10*3/uL (ref 4.0–10.5)
nRBC: 0 % (ref 0.0–0.2)

## 2022-01-23 LAB — MAGNESIUM: Magnesium: 1.6 mg/dL — ABNORMAL LOW (ref 1.7–2.4)

## 2022-01-23 LAB — PHOSPHORUS: Phosphorus: 3.7 mg/dL (ref 2.5–4.6)

## 2022-01-23 MED ORDER — ACETAMINOPHEN 325 MG PO TABS: 650.0000 mg | ORAL_TABLET | Freq: Four times a day (QID) | ORAL | 0 refills | Status: AC | PRN

## 2022-01-23 MED ORDER — POLYETHYLENE GLYCOL 3350 17 G PO PACK: 17.0000 g | PACK | Freq: Every day | ORAL | 0 refills | Status: AC

## 2022-01-23 MED ORDER — SENNOSIDES-DOCUSATE SODIUM 8.6-50 MG PO TABS: 1.0000 | ORAL_TABLET | Freq: Every day | ORAL | 0 refills | Status: AC

## 2022-01-23 MED ORDER — POTASSIUM CHLORIDE CRYS ER 20 MEQ PO TBCR
40.0000 meq | EXTENDED_RELEASE_TABLET | Freq: Two times a day (BID) | ORAL | Status: DC
  Administered 2022-01-23: 40 meq via ORAL
  Filled 2022-01-23: qty 2

## 2022-01-23 MED ORDER — DOXYCYCLINE HYCLATE 100 MG PO CAPS: 100.0000 mg | ORAL_CAPSULE | Freq: Two times a day (BID) | ORAL | 0 refills | Status: AC

## 2022-01-23 MED ORDER — MAGNESIUM SULFATE 2 GM/50ML IV SOLN
2.0000 g | Freq: Once | INTRAVENOUS | Status: AC
  Administered 2022-01-23: 2 g via INTRAVENOUS
  Filled 2022-01-23: qty 50

## 2022-01-23 NOTE — Progress Notes (Signed)
Reviewed written d/c instructions w pt and all questions answered. He verbalized understanding. D/ C via w/c w all belongings in stable condition. 

## 2022-01-23 NOTE — TOC Transition Note (Signed)
Transition of Care [facility]) - CM/SW Discharge Note   Patient Details  Name: Hilberto Burzynski MRN: [id number] Date of Birth: 31-Jul-1990  [facility] Care [facility] CM/SW Contact:  Leeroy Cha, RN Phone Number: 01/23/2022, 9:39 AM   Clinical Narrative:     [id number] discharged to return home.  Chart reviewed for TOC needs.  None found.  Patient self care.   Final next level of care: Home w Home Health Services Barriers to Discharge: Barriers Resolved   Patient Goals and CMS Choice Patient states their goals for this hospitalization and ongoing recovery are:: return home with mother; begin SA treament when mobility better CMS Medicare.gov Compare Post Acute Care list provided to:: Patient Choice offered to / list presented to : Patient  Discharge Placement                       Discharge Plan and Services In-house Referral: Clinical Social Work   Post Acute Care Choice: Durable Medical Equipment          DME Arranged: Other see comment (small base quad cane) DME Agency: [employer] Date DME Agency Contacted: 01/21/22   Representative spoke with at DME Agency: Andee Poles            Social Determinants of Health ([place]) Interventions     Readmission Risk Interventions   No data to display

## 2022-01-23 NOTE — Discharge Summary (Signed)
Physician Discharge Summary   Patient: Chris Parker MRN: [id number] DOB: 05-31-1990  Admit date:     01/08/2022  Discharge date: 01/23/22  Discharge Physician: Raiford Noble, DO   PCP: Patient, No Pcp Per   Recommendations at discharge:   Follow-up and establish with PCP within 1 to 2 weeks and repeat CBC, CMP, mag, Phos within 1 week Follow-up and establish with the infectious disease clinic for new onset Hepatitis C; message was sent to the on-call ID physician for arrangement of follow-up Follow-up on blood cultures Follow-up with orthopedic surgery within 1 to 2 weeks  Discharge Diagnoses: Principal Problem:   Rhabdomyolysis Active Problems:   Compartment syndrome ([place])   SIRS (systemic inflammatory response syndrome) ([facility])   Elevated troponin   Hyperkalemia   AKI (acute kidney injury) ([place])   Lactic acidosis   Transaminitis   IV drug abuse ([place])  Resolved Problems:   * No resolved hospital problems. [facility] Course: The patient 31-yo Caucasian male w/ history of IVDA, found down on the ground on his right side for unclear duration of time,reportedly with needle sticking out of right arm. In ED- in severe rhabdomyolysis w/ compartment syndrome  Rt UE  s/p OR by Dr. Tempie Donning- emergent fasciotomy RUE, placed on aggressive IVF,has SIRS criteria,but without overt underlying infectious source, cxr-right-sided airspace opacities ?asymmetric edema given that the patient was found on his right sidem procal and CTA ordere  placed onemperic broad-spectrum IV antibiotics with IV Vanco and cefepime. Also with hyperkalemia, s/p IV insulin and Lokelma, AKI creat 1.8, metabolci acidosis, transaminitis, w/ elevated troponin discussed w/ Dr. Debara Pickett, who felt that this elevation was less likely represent acute MI, but more likely on the basis of type II supply/demand mismatch- no heparin added but troponin trended and echocardiogram. ABG w/ 7.29/41/158/21. CT abd.pelvis, CT T L spine and CT Head  done done in ED SHOWED: Inflammatory fat stranding along the anterior and lateral aspect of the lower right chest wall, consistent with the patient's history of recent trauma. Mild patchy right middle lobe and anterior right lower lobe airspace disease, which may represent ill-defined areas of pulmonary contusion. An underlying infectious versus inflammatory process cannot be excluded. Patchy opacities on rt lung-consistent w/ pneumonia No osseus findings on L/T spine   9/23: Due to ongoing pain underwent right dorsal forearm compartment release. Placed Foley 9/25: Irrigation and debridement of right upper extremity 01/13/2022 he underwent repeat debridement   His CK has been improving and all his fingers are warm and well-perfused with BCR in each finger.  Orthospine are removing his incisional wound VAC today and applying a clean new dry dressing.  Given his right leg swelling a lower extremity venous duplex was ordered and did not show any evidence of DVT.  CK still little bit high but has not significantly improved at the time of admission and renal functions continue to improve slowly.   01/21/2022 orthopedic surgery has evaluated and took the incisional wound VAC down today and his volar wound was clean and dry and well approximated.  The wounds were redressed with Xeroform and bulky folded Curlex and an Ace wrap.  Orthopedic surgery discussed the importance of outpatient hand therapy to work on edema control and passive range of motion to prevent finger contractures.  They are recommending OT fabricated a removable dorsal blocking splint in intrinsic plus position while patient is in-house and recommended following up in outpatient setting within 2 weeks after final wound closure   The night  before last patient spiked a temperature of 102.9.  We will continue monitor his temperature curve and improvement started on doxycycline for bronchitis.  Culture work-up has been negative and he is improved and  afebrile.  He is medically stable for discharge and orthopedic surgery has cleared him for discharge as well and he will follow-up with orthopedic surgery on Monday and have outpatient PT and OT.   Assessment and Plan:  Severe Rhabdomyolysis nontraumatic Compartment syndrome RUE w/ necrotic tissue on RUE: -Compartment syndrome with rhabdomyolysis due to prolonged laying on right side/unresponsive status in the setting of IVDA.  S/P emergent fasciotomy of forearm and hand 9/22 and 9/23 and repeat on 9/25 (Dr. Tempie Donning).  Underwent another I&D on 9/27. Still no movement in his right hand.  Some blistering is noted which is being addressed by orthopedics. Strength in the right lower extremity appears to be improving.  Has been able to ambulate.  Seen by physical therapy.  Outpatient therapy is recommended. CK level continues to improve and last CK was 786 on last check Noted to have low-grade fever appears to have subsided.  WBC elevated but much better compared to last week.  Stable today. Orthopedics plans to remove the wound VAC in the next few days after which hopefully patient should be able to go home. -Follow-up on Ortho recommendations and he is cleared from Ortho standpoint for discharge and will have outpatient PT and OT   Acute kidney injury, improved Due to rhabdomyolysis. No prior labs. Nephrology was consulted to assist with management.  Renal function has improved.  Nephrology has signed off. Foley catheter has been discontinued.  Patient has been able to void. -Patient's BUN/creatinine has improved and gone from 48/2.29 is now trended down to 13/0.86 on last check -We will resume IV fluid hydration with normal saline at 75 MLS per hour -CT scan of the abdomen pelvis did not show any hydronephrosis. -We will need to avoid further nephrotoxic medications, contrast dyes, hypotension and dehydration to ensure adequate renal perfusion Creatinine has been stable.  Continues to have good  urine output. -We will follow-up CMP in a.m.   Hypocalcemia in the setting of hypoalbuminemia -Corrected calcium is stable   Hyponatremia Improved and now sodium is 134 -Continue monitor and trend intermittently repeat CMP within the week   Metabolic acidosis w/ lactic acidosis -Lactic acid and bicarb level has normalized.   -Patient's CO2 is now 26, anion gap is 3, chloride level is 105 -To monitor and trend and repeat CMP in the a.m.   Potassium abnormalities, hyperkalemia followed by hypokalemia -Potassium level and was 3.4.  Mag level was 1.6 -Replete prior to discharge -Continue to monitor and replete as necessary -Repeat CMP within 1 week   Hypomagnesemia -Patient's magnesium level is now 1.6 -Replete with IV mag sulfate 4 g -Continue to monitor and replete as necessary -Repeat mag level in a.m.   RUE/RLE weakness w/ decreased sensation  -CT head no acute findings CT L and T-spine no acute finding.   -Most likely weakness from compression neuropathy/rhabdomyolysis from laying on the right side,+/-drug use/unresponsive on floor. -Has regained strength in the right lower extremity but right lower extremity was a little swollen today so ordered a right lower extremity venous duplex and was negative for DVT.     Right sided pneumonia likely aspiration pneumonia/Sepsis POA, improved -Pro-Cal elevated 49, wbc I was elevated to 26,000 but is now 9.8 and normalized  -He spiked a temperature of 102.9 the day  before yesterday and unclear etiology and is now improved -imaging w/ rt sided pneumonia.  Blood cultures no growth so far.  Vancomycin was discontinued.   -Patient has completed 7-day course of cefepime. -Respiratory status is stable and will need an ambulatory home -O2 screen prior to discharge -Initiate flutter valve and incentive spirometry -Repeat chest x-ray today showed "Central bronchitis, with no other evidence of acute chest process." -We will place him on  empirically on doxycycline for 5 days   Fever, improved -Unclear etiology but this is improved -Panculture, obtain blood cultures x2, urinalysis and chest x-ray -Empirically start doxycycline IV twice daily and changed to p.o. to complete the course -Chest x-ray as above as it showed "Central bronchitis, with no other evidence of acute chest process." -Follow blood cultures x2 and showed no growth to date 1 day -Urinalysis done and showed clear appearance with 50 glucose, negative hemoglobin, negative ketones, small leukocytes, negative nitrites, pH of 6.0, and there is rare bacteria with 21-50 WBCs -Continue monitor temperature curve and WBC curve this is improved and he is stable for discharge   Transaminitis Significantly high AST ALT likely due to rhabdomyolysis.  AST is now 44 and ALT is now 53.  Hepatitis C also contributing.  No acute abnormalities noted on ultrasound of the right upper quadrant. -LFTs gradually improving.   Hepatitis C -Hep C antibody positive and viral load 275,000.  He will need follow-up with [facility] discharge.  This appears to be a new diagnosis. -Follow-up with ID in the clinic   High blood pressure no prior history of hypertension  -Pain is contributing to high blood pressure.  Even not too aggressive in treating it due to his renal dysfunction.  Continue to monitor for now.  May need to consider antihypertensives at discharge if his blood pressure is still elevated at that time. -Last BP reading was 152/99   Elevated troponin  Due to demand ischemia from rhabdomyolysis/compartment syndrome, trops [phone] 1200: Dr. Debara Pickett was discussed-no further recommendation, echo with EF 60 to 65%, no RWMA   IVDA  -Admits using fentanyl, urine drug screen negative.  -Continue substance abuse counseling   Acute metabolic encephalopathy -Likely multifactorial metabolic and toxic in the setting of IVDA, sepsis.  Mentation seems to be back to baseline.   Normocytic  anemia -No evidence for overt bleeding.  Some blood loss with surgery as expected.   Slow drift down in hemoglobin noted over the past several days.  No overt bleeding noted.  Continue to monitor. Anemia panel reviewed.  Ferritin 107, iron 61, TIBC 346, percent saturation 18, vitamin [id number], folic acid 7.2.  No clear deficiencies identified. -Continue monitor for signs and symptoms of bleeding; no overt bleeding noted and hemoglobin/hematocrit has been stable last few checks and is now 9.2/27.2-> 8.5/25.5 -> 8.4/25.2 and dropped to 7.9/24.5 but is stable in the setting of dilutional drop today -Repeat CBC within 1 week   Hypomagnesemia -Patient's mag level is now 1.6 -Replete with IV mag sulfate 2 grams -Continue monitor and replete as necessary -Repeat Mag Level in the in a.m.   Scrotal edema Scrotal support has been ordered.  No erythema noted.  Testes are palpated bilaterally and noted to be normal without any tenderness.   -Continue scrotal elevation   Consultants: Orthopedic surgery, plastic surgery, nephrology Procedures performed: As delineated as above Disposition:  Home with outpatient PT and OT Diet recommendation:  Regular diet DISCHARGE MEDICATION: Allergies as of 01/23/2022   No Known Allergies  Medication List     TAKE these medications    acetaminophen 325 MG tablet Commonly known as: TYLENOL Take 2 tablets (650 mg total) by mouth every 6 (six) hours as needed for mild pain (or Fever >/= 101).   doxycycline 100 MG capsule Commonly known as: VIBRAMYCIN Take 1 capsule (100 mg total) by mouth 2 (two) times daily for 4 days.   polyethylene glycol 17 g packet Commonly known as: MIRALAX / GLYCOLAX Take 17 g by mouth daily. Start taking on: January 24, 2022   senna-docusate 8.6-50 MG tablet Commonly known as: Senokot-S Take 1 tablet by mouth at bedtime.               Discharge Care Instructions  (From admission, onward)           Start      Ordered   01/23/22 0000  Discharge wound care:       Comments: Per Orthopedic Surgery   01/23/22 0936   01/23/22 0000  No dressing needed        01/23/22 0936            Follow-up Information     Orth[facility], [facility]   Specialty: Specialist Why: THE OFFICE SHOULD CONTACT YOU TO SET UP OUTPATIENT THERAPY VISITS;  IF YOU DO NOT HEAR FROM THEM WITHIN THE FIRST WEEK, PLEASE CALL THEM. Contact information: [address] [place] [address] [phone]                Discharge Exam: Filed Weights   01/20/22 0421 01/22/22 0332 01/23/22 0508  Weight: 76.1 kg 78.9 kg 81.6 kg   Vitals:   01/22/22 2129 01/23/22 0508  BP: (!) 168/91 (!) 151/94  Pulse: (!) 110 99  Resp: 18 18  Temp: 99 F (37.2 C) 98.8 F (37.1 C)  SpO2: 100% 100%   Examination: Physical Exam:  Constitutional: WN/WD Caucasian male currently no acute distress Respiratory: Diminished to auscultation bilaterally, no wheezing, rales, rhonchi or crackles. Normal respiratory effort and patient is not tachypenic. No accessory muscle use.  Unlabored breathing Cardiovascular: RRR, no murmurs / rubs / gallops. S1 and S2 auscultated.  Right arm is wrapped and does have some right leg swelling Abdomen: Soft, non-tender, non-distended.  Bowel sounds positive.  GU: Deferred. Musculoskeletal: No clubbing / cyanosis of digits/nails. No joint deformity upper and lower extremities.  Skin: No rashes, but does have some necrotic areas on his fifth pinky on his right hand. No induration; Warm and dry.  Neurologic: CN 2-12 grossly intact with no focal deficits. Romberg sign and cerebellar reflexes not assessed.  Psychiatric: Normal judgment and insight. Alert and oriented x 3. Normal mood and appropriate affect.   Condition at discharge: stable  The results of significant diagnostics from this hospitalization (including imaging, microbiology, ancillary and laboratory) are listed below for reference.    Imaging Studies: DG CHEST PORT 1 VIEW  Result Date: 01/22/2022 CLINICAL DATA:  [id number].  Fever. EXAM: PORTABLE CHEST 1 VIEW COMPARISON:  Portable chest 01/09/2022 FINDINGS: 4:50 a.m. Right upper and middle lobe infiltrates on the prior study appear resolved. Bronchial thickening is noted centrally consistent with bronchitis but no focal lung infiltrate is seen. Heart size and vasculature are normal with stable mediastinum. Unremarkable osseous structures. IMPRESSION: Central bronchitis, with no other evidence of acute chest process. Electronically Signed   By: Almira Bar M.D.   On: 01/22/2022 06:28   VAS [place] LOWER EXTREMITY VENOUS (DVT)  Result Date:  01/20/2022  Lower Venous DVT Study Patient Name:  Chris Parker  Date of Exam:   01/20/2022 Medical Rec #: [id number]    Accession #:    [id number] Date of Birth: 04-13-1991   Patient Gender: M Patient Age:   85 years Exam Location:  [facility] Procedure:      VAS [place] LOWER EXTREMITY VENOUS (DVT) Referring Phys: Raiford Noble --------------------------------------------------------------------------------  Indications: Pain, and weakness. Other Indications: Patient admitted with rhabdomyolysis after being found down                    at home. Comparison Study: No previous exams Performing Technologist: Jody Hill RVT, RDMS  Examination Guidelines: A complete evaluation includes B-mode imaging, spectral Doppler, color Doppler, and power Doppler as needed of all accessible portions of each vessel. Bilateral testing is considered an integral part of a complete examination. Limited examinations for reoccurring indications may be performed as noted. The reflux portion of the exam is performed with the patient in reverse Trendelenburg.  +---------+---------------+---------+-----------+----------+--------------+ RIGHT    CompressibilityPhasicitySpontaneityPropertiesThrombus Aging  +---------+---------------+---------+-----------+----------+--------------+ CFV      Full           No       Yes                                 +---------+---------------+---------+-----------+----------+--------------+ SFJ      Full                                                        +---------+---------------+---------+-----------+----------+--------------+ FV Prox  Full           No       Yes                                 +---------+---------------+---------+-----------+----------+--------------+ FV Mid   Full           No       Yes                                 +---------+---------------+---------+-----------+----------+--------------+ FV DistalFull           No       Yes                                 +---------+---------------+---------+-----------+----------+--------------+ PFV      Full                                                        +---------+---------------+---------+-----------+----------+--------------+ POP      Full           No       Yes                                 +---------+---------------+---------+-----------+----------+--------------+ PTV      Full                                                        +---------+---------------+---------+-----------+----------+--------------+  PERO     Full                                                        +---------+---------------+---------+-----------+----------+--------------+   +----+---------------+---------+-----------+----------+--------------+ LEFTCompressibilityPhasicitySpontaneityPropertiesThrombus Aging +----+---------------+---------+-----------+----------+--------------+ CFV Full           No       Yes                                 +----+---------------+---------+-----------+----------+--------------+     Summary: RIGHT: - There is no evidence of deep vein thrombosis in the lower extremity.  - No cystic structure found in the popliteal fossa.   LEFT: - No evidence of common femoral vein obstruction.  *See table(s) above for measurements and observations. Electronically signed by Harold Barban MD on 01/20/2022 at 10:37:25 PM.    Final    DG CHEST PORT 1 VIEW  Result Date: 01/09/2022 CLINICAL DATA:  Pneumonia EXAM: PORTABLE CHEST 1 VIEW COMPARISON:  01/08/2022 FINDINGS: There is partial clearing of infiltrate in right lung. There is residual alveolar infiltrate in right upper lung fields. Left lung is clear. There is no pleural effusion or pneumothorax. IMPRESSION: There is partial clearing of infiltrates in right lung suggesting resolving pneumonia. Residual alveolar infiltrates are noted in right upper lung field. There is no pleural effusion. Electronically Signed   By: Elmer Picker M.D.   On: 01/09/2022 15:28   ECHOCARDIOGRAM COMPLETE  Result Date: 01/09/2022    ECHOCARDIOGRAM REPORT   Patient Name:   Chris Parker Date of Exam: 01/09/2022 Medical Rec #:  [id number]   Height:       69.0 in Accession #:    [id number]  Weight:       173.1 lb Date of Birth:  11-29-1990  BSA:          1.943 m Patient Age:    30 years    BP:           163/99 mmHg Patient Gender: M           HR:           79 bpm. Exam Location:  Inpatient Procedure: 2D Echo Indications:    elevated troponins  History:        Patient has no prior history of Echocardiogram examinations.  Sonographer:    Harvie Junior Referring Phys: [id number] [place]  1. Left ventricular ejection fraction, by estimation, is 60 to 65%. The left ventricle has normal function. The left ventricle has no regional wall motion abnormalities. Left ventricular diastolic parameters were normal.  2. Right ventricular systolic function is normal. The right ventricular size is normal. There is normal pulmonary artery systolic pressure.  3. The mitral valve is normal in structure. No evidence of mitral valve regurgitation.  4. The aortic valve was not well visualized. Aortic valve regurgitation  is not visualized. Comparison(s): No prior Echocardiogram. Conclusion(s)/Recommendation(s): Normal biventricular function without evidence of hemodynamically significant valvular heart disease. FINDINGS  Left Ventricle: Left ventricular ejection fraction, by estimation, is 60 to 65%. The left ventricle has normal function. The left ventricle has no regional wall motion abnormalities. The left ventricular internal cavity size was normal in size. There is  no left ventricular hypertrophy. Left ventricular diastolic parameters  were normal. Right Ventricle: The right ventricular size is normal. No increase in right ventricular wall thickness. Right ventricular systolic function is normal. There is normal pulmonary artery systolic pressure. The tricuspid regurgitant velocity is 2.76 m/s, and  with an assumed right atrial pressure of 3 mmHg, the estimated right ventricular systolic pressure is [id number] mmHg. Left Atrium: Left atrial size was normal in size. Right Atrium: Right atrial size was normal in size. Pericardium: There is no evidence of pericardial effusion. Mitral Valve: The mitral valve is normal in structure. No evidence of mitral valve regurgitation. Tricuspid Valve: The tricuspid valve is normal in structure. Tricuspid valve regurgitation is not demonstrated. Aortic Valve: The aortic valve was not well visualized. Aortic valve regurgitation is not visualized. Aortic valve mean gradient measures 5.0 mmHg. Aortic valve peak gradient measures 8.9 mmHg. Aortic valve area, by VTI measures 2.51 cm. Pulmonic Valve: The pulmonic valve was not well visualized. Pulmonic valve regurgitation is not visualized. Aorta: The aortic root and ascending aorta are structurally normal, with no evidence of dilitation. IAS/Shunts: The interatrial septum was not well visualized.  LEFT VENTRICLE PLAX 2D LVIDd:         4.80 cm     Diastology LVIDs:         3.40 cm     LV e' medial:    14.70 cm/s LV PW:         0.90 cm     LV E/e' medial:   7.6 LV IVS:        0.70 cm     LV e' lateral:   23.40 cm/s LVOT diam:     2.00 cm     LV E/e' lateral: 4.7 LV SV:         58 LV SV Index:   30 LVOT Area:     3.14 cm  LV Volumes (MOD) LV vol d, MOD A2C: 80.6 ml LV vol d, MOD A4C: 87.5 ml LV vol s, MOD A2C: 34.5 ml LV vol s, MOD A4C: 38.7 ml LV SV MOD A2C:     46.1 ml LV SV MOD A4C:     87.5 ml LV SV MOD BP:      50.1 ml RIGHT VENTRICLE RV Basal diam:  3.80 cm RV Mid diam:    3.50 cm RV S prime:     21.80 cm/s TAPSE (M-mode): 2.3 cm LEFT ATRIUM             Index        RIGHT ATRIUM           Index LA diam:        2.90 cm 1.49 cm/m   RA Area:     12.00 cm LA Vol (A2C):   36.7 ml 18.89 ml/m  RA Volume:   26.00 ml  13.38 ml/m LA Vol (A4C):   38.6 ml 19.87 ml/m LA Biplane Vol: 38.1 ml 19.61 ml/m  AORTIC VALVE                    PULMONIC VALVE AV Area (Vmax):    2.74 cm     PV Vmax:       1.34 m/s AV Area (Vmean):   2.55 cm     PV Peak grad:  7.2 mmHg AV Area (VTI):     2.51 cm AV Vmax:           149.00 cm/s AV Vmean:          96.900  cm/s AV VTI:            0.230 m AV Peak Grad:      8.9 mmHg AV Mean Grad:      5.0 mmHg LVOT Vmax:         130.00 cm/s LVOT Vmean:        78.500 cm/s LVOT VTI:          0.184 m LVOT/AV VTI ratio: 0.80  AORTA Ao Root diam: 3.30 cm Ao Asc diam:  3.10 cm MITRAL VALVE                TRICUSPID VALVE MV Area (PHT): 4.21 cm     TR Peak grad:   30.5 mmHg MV Decel Time: 180 msec     TR Vmax:        276.00 cm/s MV E velocity: 111.00 cm/s MV A velocity: 62.10 cm/s   SHUNTS MV E/A ratio:  1.79         Systemic VTI:  0.18 m                             Systemic Diam: 2.00 cm Phineas Inches Electronically signed by Phineas Inches Signature Date/Time: 01/09/2022/1:27:33 PM    Final    US Abdomen Limited RUQ (LIVER/GB)  Result Date: 01/09/2022 CLINICAL DATA:  31 year old male with abnormal LFTs. EXAM: ULTRASOUND ABDOMEN LIMITED RIGHT UPPER QUADRANT COMPARISON:  01/08/2022 CT FINDINGS: Gallbladder: A small amount of pericholecystic fluid is noted.  There is no evidence of cholelithiasis, gallbladder wall thickening or definite sonographic Murphy sign. Common bile duct: Diameter: 3.5 mm. There is no evidence of intrahepatic or extrahepatic biliary dilatation. Liver: No focal lesion identified. Within normal limits in parenchymal echogenicity. Portal vein is patent on color Doppler imaging with normal direction of blood flow towards the liver. Other: None. IMPRESSION: 1. Small amount of thick nonspecific pericholecystic fluid without cholelithiasis or other evidence of acute cholecystitis. 2. Unremarkable liver.  No evidence of biliary dilatation. Electronically Signed   By: Margarette Canada M.D.   On: 01/09/2022 10:42   CT L-SPINE NO CHARGE  Result Date: 01/08/2022 CLINICAL DATA:  Altered mental status, trauma EXAM: CT LUMBAR SPINE WITHOUT CONTRAST TECHNIQUE: Multidetector CT imaging of the lumbar spine was performed without intravenous contrast administration. Multiplanar CT image reconstructions were also generated. RADIATION DOSE REDUCTION: This exam was performed according to the departmental dose-optimization program which includes automated exposure control, adjustment of the mA and/or kV according to patient size and/or use of iterative reconstruction technique. COMPARISON:  None Available. FINDINGS: Segmentation: 5 lumbar type vertebrae. Alignment: Straightening of the lumbar spine. Vertebrae: No acute fracture or focal pathologic process. Small Schmorl node in the superior endplate of L2 vertebral body. Paraspinal and other soft tissues: Negative. Disc levels: Disc heights are maintained. Significant disc bulge, spinal canal or neural foraminal stenosis. Mild multilevel facet joint arthropathy. IMPRESSION: 1.  No acute fracture or traumatic subluxation. 2.  Mild multiple facet joint arthropathy Electronically Signed   By: Keane Police D.O.   On: 01/08/2022 22:07   CT T-SPINE NO CHARGE  Result Date: 01/08/2022 CLINICAL DATA:  Initial evaluation for  you attic behavior, fentanyl use, swelling to right hand and face. EXAM: CT THORACIC SPINE WITHOUT CONTRAST TECHNIQUE: Multidetector CT images of the thoracic were obtained using the standard protocol without intravenous contrast. RADIATION DOSE REDUCTION: This exam was performed according to the departmental dose-optimization program which includes automated exposure  control, adjustment of the mA and/or kV according to patient size and/or use of iterative reconstruction technique. COMPARISON:  None Available. FINDINGS: Alignment: Physiologic with preservation of the normal thoracic kyphosis. No listhesis. Vertebrae: Vertebral body height maintained without acute or chronic fracture. Visualized ribs intact. No worrisome osseous lesions. No evidence for osteomyelitis discitis or septic arthritis by CT. Paraspinal and other soft tissues: Paraspinous soft tissues within normal limits. Patchy opacities within the visualized right lung, consistent with pneumonia. Disc levels: Unremarkable. IMPRESSION: 1. No acute osseous abnormality within the thoracic spine. 2. Patchy opacities within the visualized right lung, consistent with pneumonia. Electronically Signed   By: Jeannine Boga M.D.   On: 01/08/2022 22:03   CT ABDOMEN PELVIS W CONTRAST  Result Date: 01/08/2022 CLINICAL DATA:  Status post trauma. EXAM: CT ABDOMEN AND PELVIS WITH CONTRAST TECHNIQUE: Multidetector CT imaging of the abdomen and pelvis was performed using the standard protocol following bolus administration of intravenous contrast. RADIATION DOSE REDUCTION: This exam was performed according to the departmental dose-optimization program which includes automated exposure control, adjustment of the mA and/or kV according to patient size and/or use of iterative reconstruction technique. CONTRAST:  39mL OMNIPAQUE IOHEXOL 350 MG/ML SOLN COMPARISON:  None Available. FINDINGS: Lower chest: Mild patchy right middle lobe and anterior right lower lobe  airspace disease is noted. Hepatobiliary: No focal liver abnormality is seen. No gallstones, gallbladder wall thickening, or biliary dilatation. Pancreas: Unremarkable. No pancreatic ductal dilatation or surrounding inflammatory changes. Spleen: Normal in size without focal abnormality. Adrenals/Urinary Tract: Adrenal glands are unremarkable. Kidneys are normal, without renal calculi, focal lesion, or hydronephrosis. Bladder is unremarkable. Stomach/Bowel: Stomach is within normal limits. Appendix appears normal. No evidence of bowel wall thickening, distention, or inflammatory changes. Vascular/Lymphatic: No significant vascular findings are present. No enlarged abdominal or pelvic lymph nodes. Reproductive: Prostate is unremarkable. Other: No abdominal wall hernia.  No abdominopelvic ascites. Musculoskeletal: Mild to moderate severity subcutaneous and para muscular inflammatory fat stranding is seen along the anterior and lateral aspect of the lower right chest wall. No acute osseous abnormalities are identified. IMPRESSION: 1. Inflammatory fat stranding along the anterior and lateral aspect of the lower right chest wall, consistent with the patient's history of recent trauma. 2. Mild patchy right middle lobe and anterior right lower lobe airspace disease, which may represent ill-defined areas of pulmonary contusion. An underlying infectious versus inflammatory process cannot be excluded. Electronically Signed   By: Virgina Norfolk M.D.   On: 01/08/2022 21:46   CT Angio Chest PE W and/or Wo Contrast  Result Date: 01/08/2022 CLINICAL DATA:  Sepsis. IV drug use. Right upper extremity and facial swelling. Altered mental status. High probability for pulmonary embolism. EXAM: CT ANGIOGRAPHY CHEST WITH CONTRAST TECHNIQUE: Multidetector CT imaging of the chest was performed using the standard protocol during bolus administration of intravenous contrast. Multiplanar CT image reconstructions and MIPs were obtained to  evaluate the vascular anatomy. RADIATION DOSE REDUCTION: This exam was performed according to the departmental dose-optimization program which includes automated exposure control, adjustment of the mA and/or kV according to patient size and/or use of iterative reconstruction technique. CONTRAST:  70mL OMNIPAQUE IOHEXOL 350 MG/ML SOLN COMPARISON:  None Available. FINDINGS: Cardiovascular: Satisfactory opacification of pulmonary arteries noted, and no pulmonary emboli identified. No evidence of thoracic aortic dissection or aneurysm. Mediastinum/Nodes: No masses or pathologically enlarged lymph nodes identified. Lungs/Pleura: Right lung airspace opacity is seen, greatest in the right upper lobe, consistent with pneumonia. No evidence of mass or pleural effusion. Upper  abdomen: No acute findings. Musculoskeletal: No suspicious bone lesions identified. Swelling and edema is seen involving the right lateral chest wall muscles and subcutaneous tissues. Review of the MIP images confirms the above findings. IMPRESSION: No evidence of pulmonary embolism. Right lung airspace opacity, greatest in right upper lobe, consistent with pneumonia. Swelling and edema involving right lateral chest wall muscles and subcutaneous tissues. Electronically Signed   By: Marlaine Hind M.D.   On: 01/08/2022 21:41   CT Cervical Spine Wo Contrast  Result Date: 01/08/2022 CLINICAL DATA:  Patient acting erratic with swollen right hand and right face EXAM: CT HEAD WITHOUT CONTRAST CT CERVICAL SPINE WITHOUT CONTRAST TECHNIQUE: Multidetector CT imaging of the head and cervical spine was performed following the standard protocol without intravenous contrast. Multiplanar CT image reconstructions of the cervical spine were also generated. RADIATION DOSE REDUCTION: This exam was performed according to the departmental dose-optimization program which includes automated exposure control, adjustment of the mA and/or kV according to patient size and/or use  of iterative reconstruction technique. COMPARISON:  None Available. FINDINGS: CT HEAD FINDINGS Exam is mildly compromised by motion artifact. Brain: No intracranial hemorrhage, mass effect, or evidence of acute infarct. No hydrocephalus. No extra-axial fluid collection. Vascular: No hyperdense vessel or unexpected calcification. Skull: No fracture or focal lesion. Sinuses/Orbits: No acute finding. Paranasal sinuses and mastoid air cells are well aerated. Other: None. CT CERVICAL SPINE FINDINGS Exam is mildly compromised by motion artifact. Alignment: Normal. Skull base and vertebrae: No definite acute fracture. Mild anterior wedging of C6 is presumed chronic though technically age indeterminate. Irregularity through the C4 vertebral body corresponds to area of motion and is artifactual. No primary bone lesion or focal pathologic process. Soft tissues and spinal canal: No prevertebral fluid or swelling. No visible canal hematoma. Disc levels: No significant spondylosis or disc space height loss. No high-grade spinal canal or neural foraminal narrowing. Upper chest: Negative. Other: None. IMPRESSION: 1. No acute intracranial abnormality or calvarial fracture. 2. No definite acute fracture in the cervical spine. Age indeterminate wedging of C6 is presumed chronic. Electronically Signed   By: Placido Sou M.D.   On: 01/08/2022 21:39   CT HEAD WO CONTRAST (5MM)  Result Date: 01/08/2022 CLINICAL DATA:  Patient acting erratic with swollen right hand and right face EXAM: CT HEAD WITHOUT CONTRAST CT CERVICAL SPINE WITHOUT CONTRAST TECHNIQUE: Multidetector CT imaging of the head and cervical spine was performed following the standard protocol without intravenous contrast. Multiplanar CT image reconstructions of the cervical spine were also generated. RADIATION DOSE REDUCTION: This exam was performed according to the departmental dose-optimization program which includes automated exposure control, adjustment of the mA  and/or kV according to patient size and/or use of iterative reconstruction technique. COMPARISON:  None Available. FINDINGS: CT HEAD FINDINGS Exam is mildly compromised by motion artifact. Brain: No intracranial hemorrhage, mass effect, or evidence of acute infarct. No hydrocephalus. No extra-axial fluid collection. Vascular: No hyperdense vessel or unexpected calcification. Skull: No fracture or focal lesion. Sinuses/Orbits: No acute finding. Paranasal sinuses and mastoid air cells are well aerated. Other: None. CT CERVICAL SPINE FINDINGS Exam is mildly compromised by motion artifact. Alignment: Normal. Skull base and vertebrae: No definite acute fracture. Mild anterior wedging of C6 is presumed chronic though technically age indeterminate. Irregularity through the C4 vertebral body corresponds to area of motion and is artifactual. No primary bone lesion or focal pathologic process. Soft tissues and spinal canal: No prevertebral fluid or swelling. No visible canal hematoma. Disc levels: No  significant spondylosis or disc space height loss. No high-grade spinal canal or neural foraminal narrowing. Upper chest: Negative. Other: None. IMPRESSION: 1. No acute intracranial abnormality or calvarial fracture. 2. No definite acute fracture in the cervical spine. Age indeterminate wedging of C6 is presumed chronic. Electronically Signed   By: Placido Sou M.D.   On: 01/08/2022 21:39   DG Hand Complete Left  Result Date: 01/08/2022 CLINICAL DATA:  Hand swelling EXAM: LEFT HAND - COMPLETE 3+ VIEW COMPARISON:  None Available. FINDINGS: There is no evidence of fracture or dislocation. There is no evidence of arthropathy or other focal bone abnormality. Soft tissues are unremarkable. IMPRESSION: Negative. Electronically Signed   By: Davina Poke D.O.   On: 01/08/2022 18:28   DG Hand Complete Right  Result Date: 01/08/2022 CLINICAL DATA:  Right hand swelling with cutaneous blisters EXAM: RIGHT HAND - COMPLETE 3+  VIEW COMPARISON:  None Available. FINDINGS: There is no evidence of fracture or dislocation. No erosion or periosteal elevation. There is no evidence of arthropathy or other focal bone abnormality. Generalized soft tissue swelling. No soft tissue gas. IMPRESSION: 1. No acute osseous abnormality. 2. Generalized soft tissue swelling. Electronically Signed   By: Davina Poke D.O.   On: 01/08/2022 18:28   DG Chest Port 1 View  Result Date: 01/08/2022 CLINICAL DATA:  Possible sepsis.  Overdose EXAM: PORTABLE CHEST 1 VIEW COMPARISON:  None Available. FINDINGS: The heart size and mediastinal contours are within normal limits. Diffuse interstitial and alveolar opacities throughout the right lung. Relatively mild interstitial prominence within the left lung. No pleural effusion or pneumothorax. The visualized skeletal structures are unremarkable. IMPRESSION: Diffuse airspace opacities throughout the right lung with mild interstitial prominence within the left lung. Findings may represent multifocal pneumonia or asymmetric edema. Electronically Signed   By: Davina Poke D.O.   On: 01/08/2022 18:27    Microbiology: Results for orders placed or performed during the hospital encounter of 01/08/22  Urine Culture     Status: None   Collection Time: 01/08/22  5:09 PM   Specimen: In/Out Cath Urine  Result Value Ref Range Status   Specimen Description   Final    IN/OUT CATH URINE Performed at [facility], [place] [address], [place], [place] [address]    Special Requests   Final    NONE Performed at [facility], [place] [address] [place], [place] [address]    Culture   Final    NO GROWTH Performed at [facility], [place] [address] [place], [place] [address]    Report Status 01/10/2022 FINAL  Final  Blood Culture (routine x 2)     Status: None   Collection Time: 01/08/22  5:39 PM   Specimen: BLOOD  Result Value Ref Range Status   Specimen Description   Final     BLOOD RIGHT ANTECUBITAL Performed at [place] [address] [place], [place] [address]    Special Requests   Final    BOTTLES DRAWN AEROBIC AND ANAEROBIC Blood Culture adequate volume Performed at [place] [address] [place], [place] [address]    Culture   Final    NO GROWTH 5 DAYS Performed at [facility], [place] [address], [place], [place] [address]    Report Status 01/13/2022 FINAL  Final  Resp Panel by RT-PCR (Flu A&B, Covid) Anterior Nasal Swab     Status: None   Collection Time: 01/08/22  5:42 PM  Specimen: Anterior Nasal Swab  Result Value Ref Range Status   SARS Coronavirus 2 by RT PCR NEGATIVE NEGATIVE Final    Comment: (NOTE) SARS-CoV-2 target nucleic acids are NOT DETECTED.  The SARS-CoV-2 RNA is generally detectable in upper respiratory specimens during the acute phase of infection. The lowest concentration of SARS-CoV-2 viral copies this assay can detect is 138 copies/mL. A negative result does not preclude SARS-Cov-2 infection and should not be used as the sole basis for treatment or other patient management decisions. A negative result may occur with  improper specimen collection/handling, submission of specimen other than nasopharyngeal swab, presence of viral mutation(s) within the areas targeted by this assay, and inadequate number of viral copies(<138 copies/mL). A negative result must be combined with clinical observations, patient history, and epidemiological information. The expected result is Negative.  Fact Sheet for Patients:  BloggerCourse.comhttps://www.fda.gov/media/152166/download  Fact Sheet for Healthcare Providers:  SeriousBroker.ithttps://www.fda.gov/media/152162/download  This test is no t yet approved or cleared by the [place] FDA and  has been authorized for detection and/or diagnosis of SARS-CoV-2 by FDA under an Emergency Use Authorization (EUA). This EUA will remain  in effect (meaning this test can  be used) for the duration of the COVID-19 declaration under Section 564(b)(1) of the Act, 21 U.S.C.section 360bbb-3(b)(1), unless the authorization is terminated  or revoked sooner.       Influenza A by PCR NEGATIVE NEGATIVE Final   Influenza B by PCR NEGATIVE NEGATIVE Final    Comment: (NOTE) The Xpert Xpress SARS-CoV-2/FLU/RSV plus assay is intended as an aid in the diagnosis of influenza from Nasopharyngeal swab specimens and should not be used as a sole basis for treatment. Nasal washings and aspirates are unacceptable for Xpert Xpress SARS-CoV-2/FLU/RSV testing.  Fact Sheet for Patients: BloggerCourse.comhttps://www.fda.gov/media/152166/download  Fact Sheet for Healthcare Providers: SeriousBroker.ithttps://www.fda.gov/media/152162/download  This test is not yet approved or cleared by the [place] FDA and has been authorized for detection and/or diagnosis of SARS-CoV-2 by FDA under an Emergency Use Authorization (EUA). This EUA will remain in effect (meaning this test can be used) for the duration of the COVID-19 declaration under Section 564(b)(1) of the Act, 21 U.S.C. section 360bbb-3(b)(1), unless the authorization is terminated or revoked.  Performed at [facility], [address] [place], [place] [address]   Blood Culture (routine x 2)     Status: None   Collection Time: 01/08/22  6:00 PM   Specimen: BLOOD  Result Value Ref Range Status   Specimen Description   Final    BLOOD LEFT ANTECUBITAL Performed at [facility], [address] [place], [place] [address]    Special Requests   Final    BOTTLES DRAWN AEROBIC AND ANAEROBIC Blood Culture adequate volume Performed at [facility], [address] [place], [place] [address]    Culture   Final    NO GROWTH 5 DAYS Performed at [facility], [address] [place], [place] [address]    Report Status 01/13/2022 FINAL  Final  MRSA Next Gen by PCR, Nasal     Status: None    Collection Time: 01/09/22  2:53 AM   Specimen: Nasal Mucosa; Nasal Swab  Result Value Ref Range Status   MRSA by PCR Next Gen NOT DETECTED NOT DETECTED Final    Comment: (NOTE) The GeneXpert MRSA Assay (FDA approved for NASAL specimens only), is one component of a comprehensive MRSA colonization surveillance program. It is not intended to diagnose MRSA infection  nor to guide or monitor treatment for MRSA infections. Test performance is not FDA approved in patients less than 57 years old. Performed at [facility], [place] [address], [place], [place] [address]   Culture, blood (Routine X 2) w Reflex to ID Panel     Status: None (Preliminary result)   Collection Time: 01/21/22  9:25 PM   Specimen: BLOOD  Result Value Ref Range Status   Specimen Description   Final    BLOOD BLOOD LEFT FOREARM Performed at [place] [address] [place], [place] [address]    Special Requests   Final    BOTTLES DRAWN AEROBIC ONLY Blood Culture adequate volume Performed at [place] [address], [place], [place] [address]    Culture   Final    NO GROWTH 1 DAY Performed at [facility], [place] [address], [place], [place] [address]    Report Status PENDING  Incomplete  Culture, blood (Routine X 2) w Reflex to ID Panel     Status: None (Preliminary result)   Collection Time: 01/21/22  9:25 PM   Specimen: BLOOD  Result Value Ref Range Status   Specimen Description   Final    BLOOD LEFT ANTECUBITAL Performed at [place] [address] [place], [place] [address]    Special Requests   Final    BOTTLES DRAWN AEROBIC ONLY Blood Culture results may not be optimal due to an inadequate volume of blood received in culture bottles Performed at [place] [address], [place], [place] [address]    Culture   Final    NO GROWTH 1 DAY Performed at [facility], [place] [address],  [place],  [address]    Report Status PENDING  Incomplete    Labs: CBC: Recent Labs  Lab 01/19/22 0434 01/20/22 0826 01/21/22 0454 01/22/22 0446 01/23/22 0501  WBC 15.8* 14.0* 12.1* 9.8 9.5  NEUTROABS  --  10.6* 8.8* 7.1 7.0  HGB 9.2* 9.2* 8.5* 8.4* 7.9*  HCT 28.0* 27.2* 25.5* 25.2* 24.5*  MCV 92.7 91.6 91.7 92.0 92.1  PLT 241 247 220 207 [id number]   Basic Metabolic Panel: Recent Labs  Lab 01/19/22 0434 01/20/22 0826 01/21/22 0454 01/22/22 0446 01/23/22 0501  NA 135 138 138 136 134*  K 4.2 4.6 3.8 3.8 3.4*  CL 101 103 104 105 105  CO2 27 31 27 26 26   GLUCOSE 114* 115* 105* 107* 107*  BUN 27* 23* 20 15 13   CREATININE 1.56* 1.47* 1.23 1.02 0.86  CALCIUM 7.6* 7.8* 7.6* 7.5* 7.4*  MG 1.8 1.8 1.6* 1.5* 1.6*  PHOS  --  4.4 4.5 3.9 3.7   Liver Function Tests: Recent Labs  Lab 01/19/22 0434 01/20/22 0826 01/21/22 0454 01/22/22 0446 01/23/22 0501  AST 65* 59* 49* 44* 44*  ALT 74* 71* 61* 55* 53*  ALKPHOS 63 67 60 58 62  BILITOT 0.8 0.7 0.8 0.7 0.8  PROT 5.0* 5.6* 5.3* 5.2* 5.4*  ALBUMIN 2.2* 2.3* 2.2* 2.2* 2.3*   CBG: No results for input(s): "GLUCAP" in the last 168 hours.  Discharge time spent: greater than 30 minutes.  Signed: Raiford Noble, DO [facility] 01/23/2022

## 2022-01-27 ENCOUNTER — Ambulatory Visit: Payer: Medicaid Other | Attending: Internal Medicine | Admitting: Rehabilitative and Restorative Service Providers"

## 2022-01-27 ENCOUNTER — Other Ambulatory Visit: Payer: Self-pay

## 2022-01-27 DIAGNOSIS — X58XXXA Exposure to other specified factors, initial encounter: Secondary | ICD-10-CM | POA: Insufficient documentation

## 2022-01-27 DIAGNOSIS — M79604 Pain in right leg: Secondary | ICD-10-CM | POA: Insufficient documentation

## 2022-01-27 DIAGNOSIS — R269 Unspecified abnormalities of gait and mobility: Secondary | ICD-10-CM | POA: Insufficient documentation

## 2022-01-27 DIAGNOSIS — T79A11A Traumatic compartment syndrome of right upper extremity, initial encounter: Secondary | ICD-10-CM | POA: Diagnosis not present

## 2022-01-27 DIAGNOSIS — R2689 Other abnormalities of gait and mobility: Secondary | ICD-10-CM

## 2022-01-27 DIAGNOSIS — R262 Difficulty in walking, not elsewhere classified: Secondary | ICD-10-CM | POA: Diagnosis not present

## 2022-01-27 DIAGNOSIS — M6281 Muscle weakness (generalized): Secondary | ICD-10-CM

## 2022-01-27 DIAGNOSIS — R29898 Other symptoms and signs involving the musculoskeletal system: Secondary | ICD-10-CM

## 2022-01-27 LAB — CULTURE, BLOOD (ROUTINE X 2)
Culture: NO GROWTH
Culture: NO GROWTH
Special Requests: ADEQUATE

## 2022-01-27 NOTE — Therapy (Addendum)
OUTPATIENT PHYSICAL THERAPY LOWER EXTREMITY EVALUATION   Patient Name: Chris Parker MRN: [id number] DOB:02/04/1991, 31 y.o., male Today's Date: 01/27/2022   PT End of Session - 01/27/22 1650     Visit Number 1    Number of Visits 12    Date for PT Re-Evaluation 03/16/22    PT Start Time 1500   pt 15 min late for appt   PT Stop Time 1530    PT Time Calculation (min) 30 min    Activity Tolerance Patient tolerated treatment well             Past Medical History:  Diagnosis Date   IV drug abuse [facility])    Past Surgical History:  Procedure Laterality Date   FASCIECTOMY Right 01/08/2022   Procedure: FASCIECTOMY OF FOREARM AND HAND;  Surgeon: Marlyne Beards, MD;  Location: [facility];  Service: Orthopedics;  Laterality: Right;  RIGHT HAND AND FOREARM   FASCIOTOMY Right 01/09/2022   Procedure: RIGHT FOREARM FASCIOTOMY; APPLICATION OF WOUND VAC;  Surgeon: Marlyne Beards, MD;  Location: [facility];  Service: Orthopedics;  Laterality: Right;   I & D EXTREMITY Right 01/11/2022   Procedure: IRRIGATION AND DEBRIDEMENT RIGHT UPPER EXTREMITY;  Surgeon: Marlyne Beards, MD;  Location: [facility];  Service: Orthopedics;  Laterality: Right;   I & D EXTREMITY Right 01/13/2022   Procedure: IRRIGATION AND DEBRIDEMENT forearm;  Surgeon: Marlyne Beards, MD;  [facility]: [facility];  Service: Orthopedics;  Laterality: Right;   Patient Active Problem List   Diagnosis Date Noted   Compartment syndrome (HCC) 01/09/2022   SIRS (systemic inflammatory response syndrome) (HCC) 01/09/2022   Elevated troponin 01/09/2022   Hyperkalemia 01/09/2022   AKI (acute kidney injury) (HCC) 01/09/2022   Lactic acidosis 01/09/2022   Transaminitis 01/09/2022   IV drug abuse (HCC) 01/09/2022   Rhabdomyolysis 01/08/2022    PCP: None  REFERRING PROVIDER: Merlene Laughter DO  REFERRING DIAG: Rt LE weakness   THERAPY DIAG:  Muscle weakness (generalized) - Plan: PT plan of care cert/re-cert  Other symptoms and signs  involving the musculoskeletal system - Plan: PT plan of care cert/re-cert  Other abnormalities of gait and mobility - Plan: PT plan of care cert/re-cert  Rationale for Evaluation and Treatment Rehabilitation  ONSET DATE: 01/01/22  SUBJECTIVE:   SUBJECTIVE STATEMENT: Patient reports that he was lying on Rt side for some period of time and sustained Rt UE compartment syndrome and injury to Rt leg. Right LE is stiff, painful and weak.  PERTINENT HISTORY: Denies any other medical problems   PAIN:  Are you having pain? Yes: NPRS scale: 5/10 Pain location: Rt LE  Pain description: aching and burning  Aggravating factors: sitting still > 30 min Relieving factors: propped up when lying down   PRECAUTIONS: Other: Rt UE compartment syndrome   WEIGHT BEARING RESTRICTIONS No  FALLS:  Has patient fallen in last 6 months? Yes. 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   PLOF: Independent  PATIENT GOALS toibe able to return to work    OBJECTIVE:   COGNITION:  Overall cognitive status: Within functional limits for tasks assessed     SENSATION: Light touch: Impaired  and Not tested Patient reports that the Rt LE is numb to touch  EDEMA:  Minimal in upper thigh   MUSCLE LENGTH: Hamstrings: Right ~ 65 deg; Left 75 deg   POSTURE: rounded shoulders, increased thoracic kyphosis, flexed trunk , and weight shift left; Rt shoulder depressed and anteriorly rotated; Rt LE in external rotation    LOWER EXTREMITY ROM:    Lt LE ROM WFL's throughout  Active Assistive ROM supine Right eval Left eval  Hip flexion 85   Hip extension 0   Hip abduction 25   Hip adduction    Hip internal rotation    Hip  external rotation    Knee flexion 100   Knee extension 0   Ankle dorsiflexion 10   Ankle plantarflexion    Ankle inversion    Ankle eversion     (Blank rows = not tested)  LOWER EXTREMITY MMT:    Lt LE WFL's throughout  MMT Right eval Left eval  Hip flexion 4-/5   Hip extension 3+/4   Hip abduction 3-/5   Hip adduction    Hip internal rotation    Hip external rotation    Knee flexion 4/5   Knee extension 4+/5   Ankle dorsiflexion 3-/5   Ankle plantarflexion    Ankle inversion    Ankle eversion     (Blank rows = not tested)   FUNCTIONAL TESTS:  Difficulty with sit to stand with weight shifted to Lt with all transfers and in standing; poor balance and unable to SLS on Rt LE  GAIT: Distance walked: 40 ft x 2 Assistive device utilized: Quad cane small base Level of assistance: Complete Independence Comments: poor gait pattern with wt shifted to Lt; Rt LE in ER; large step with Rt; small step with Lt; significant wt bearing on the Lt UE through the quad cane     TODAY'S TREATMENT: Exercises: Bridging x 10 Hip abduction red TB alternating LE's x 10  Ankle DF supine x 10 Ankle DF sitting x 10  Sit to stand x 2  Standing weight shift with improved LE alignment     PATIENT EDUCATION:  Education details: POC HEP Person educated: Patient Education method: Programmer, multimedia, Demonstration, Tactile cues, Verbal cues, and Handouts Education comprehension: verbalized understanding, returned demonstration, verbal cues required, tactile cues required, and needs further education   HOME EXERCISE PROGRAM: Access Code: [email or web address] URL: [email or web address] Date: 01/27/2022 Prepared by: Corlis Leak  Exercises - Beginner Bridge  - 2 x daily - 7 x weekly - 1-2 sets - 10 reps - 3-5 sec  hold - Hooklying Isometric Clamshell  - 2 x daily - 7 x weekly - 1-2 sets - 10 reps - 3 sec  hold - Ankle Dorsiflexion with Resistance  - 2 x daily - 7 x weekly - 1-2 sets - 10 reps -  2-3 sec  hold - Supine Ankle Pumps  - 2 x daily - 7 x weekly - 1-2 sets - 10 reps - 2-3 sec  hold - Seated Ankle Dorsiflexion AROM  - 2 x daily - 7 x weekly - 1-2 sets - 10 reps - 2-3 sec  hold - Sit to Stand  - 2 x daily - 7 x weekly - 1 sets - 10 reps - 3-5 sec  hold - Standing Weight Shift  - 2 x daily - 7 x weekly - 1 sets - 20-30 reps - 2-3 sec  hold  ASSESSMENT:  CLINICAL IMPRESSION: Patient is a 31 y.o. male who was seen today for physical therapy evaluation and treatment for Rt LE  pain, stiffness and weakness as well as abnormal gait and difficulty with transfers and transitional movements. He is unable to work and requires assistance with functional activities and ADL's Patient will benefit from PT to address LE dysfunction and deficits. He will require OT consult to evaluate and treat UE for compartment syndrome requiring surgeries.     OBJECTIVE IMPAIRMENTS Abnormal gait, decreased activity tolerance, decreased balance, decreased endurance, decreased mobility, difficulty walking, decreased ROM, decreased strength, impaired flexibility, improper body mechanics, postural dysfunction, and pain.   ACTIVITY LIMITATIONS carrying, lifting, bending, sitting, standing, squatting, stairs, transfers, bed mobility, and locomotion level  PARTICIPATION LIMITATIONS: meal prep, cleaning, laundry, driving, shopping, community activity, occupation, and yard work  PERSONAL FACTORS Age, Behavior pattern, Education, Fitness, and Past/current experiences are also affecting patient's functional outcome.   REHAB POTENTIAL: Good  CLINICAL DECISION MAKING: Stable/uncomplicated  EVALUATION COMPLEXITY: Low   GOALS: Goals reviewed with patient? Yes   LONG TERM GOALS: Target date: 03/10/2022   Increase strength Rt LE to 4/5 to 5/5  Baseline:  Goal status: INITIAL  2.  Increase AROM Rt LE to WFL's allowing patient to use Rt LE for all functional activities  Baseline:  Goal status: INITIAL  3.   Independent in gait with improved gait pattern and least restrictive assistive device  Baseline:  Goal status: INITIAL  4.  Independent in all transfers and transitional movements necessary for ALD's and return to work  Baseline:  Goal status: INITIAL  5.  Independent in HEP  Baseline:  Goal status: INITIAL   PLAN: PT FREQUENCY: 2x/week  PT DURATION: 6 weeks  PLANNED INTERVENTIONS: Therapeutic exercises, Therapeutic activity, Neuromuscular re-education, Balance training, Gait training, Patient/Family education, Self Care, Stair training, Dry Needling, Electrical stimulation, Cryotherapy, Moist heat, Ultrasound, Manual therapy, and Re-evaluation  PLAN FOR NEXT SESSION: review and progress exercise and gait training as tolerated    [place], PT, MPH  01/27/2022, 5:01 PM

## 2022-02-02 ENCOUNTER — Ambulatory Visit: Payer: Medicaid Other | Admitting: Physical Therapy

## 2022-02-02 ENCOUNTER — Encounter: Payer: Self-pay | Admitting: Physical Therapy

## 2022-02-02 DIAGNOSIS — R29898 Other symptoms and signs involving the musculoskeletal system: Secondary | ICD-10-CM

## 2022-02-02 DIAGNOSIS — R262 Difficulty in walking, not elsewhere classified: Secondary | ICD-10-CM | POA: Diagnosis not present

## 2022-02-02 DIAGNOSIS — M6281 Muscle weakness (generalized): Secondary | ICD-10-CM

## 2022-02-02 DIAGNOSIS — T79A11A Traumatic compartment syndrome of right upper extremity, initial encounter: Secondary | ICD-10-CM | POA: Diagnosis not present

## 2022-02-02 DIAGNOSIS — M79604 Pain in right leg: Secondary | ICD-10-CM | POA: Diagnosis not present

## 2022-02-02 DIAGNOSIS — R2689 Other abnormalities of gait and mobility: Secondary | ICD-10-CM

## 2022-02-02 DIAGNOSIS — R269 Unspecified abnormalities of gait and mobility: Secondary | ICD-10-CM | POA: Diagnosis not present

## 2022-02-02 NOTE — Therapy (Signed)
OUTPATIENT PHYSICAL THERAPY LOWER EXTREMITY TREATMENT   Patient Name: Josephus Harriger MRN: [id number] DOB:[birth year], 31 y.o., male Today's Date: 02/02/2022   PT End of Session - 02/02/22 1225     Visit Number 2    Number of Visits 12    Date for PT Re-Evaluation 03/16/22    PT Start Time 1142    PT Stop Time 1222    PT Time Calculation (min) 40 min    Activity Tolerance Patient tolerated treatment well    Behavior During Therapy [facility] for tasks assessed/performed              Past Medical History:  Diagnosis Date   IV drug abuse [facility])    Past Surgical History:  Procedure Laterality Date   FASCIECTOMY Right 01/08/2022   Procedure: FASCIECTOMY OF FOREARM AND HAND;  Surgeon: Marlyne Beards, MD;  [facility]: [facility];  Service: Orthopedics;  Laterality: Right;  RIGHT HAND AND FOREARM   FASCIOTOMY Right 01/09/2022   Procedure: RIGHT FOREARM FASCIOTOMY; APPLICATION OF WOUND VAC;  Surgeon: Marlyne Beards, MD;  [facility]: [facility];  Service: Orthopedics;  Laterality: Right;   I & D EXTREMITY Right 01/11/2022   Procedure: IRRIGATION AND DEBRIDEMENT RIGHT UPPER EXTREMITY;  Surgeon: Marlyne Beards, MD;  [facility];  Service: Orthopedics;  Laterality: Right;   I & D EXTREMITY Right 01/13/2022   Procedure: IRRIGATION AND DEBRIDEMENT forearm;  Surgeon: Marlyne Beards, MD;  [facility];  Service: Orthopedics;  Laterality: Right;   Patient Active Problem List   Diagnosis Date Noted   Compartment syndrome (HCC) 01/09/2022   SIRS (systemic inflammatory response syndrome) (HCC) 01/09/2022   Elevated troponin 01/09/2022   Hyperkalemia 01/09/2022   AKI (acute kidney injury) (HCC) 01/09/2022   Lactic acidosis 01/09/2022   Transaminitis 01/09/2022   IV drug abuse (HCC) 01/09/2022   Rhabdomyolysis 01/08/2022    PCP: None  REFERRING PROVIDER: Merlene Laughter DO  REFERRING DIAG: Rt LE weakness   THERAPY DIAG:  Muscle weakness (generalized)  Other symptoms and signs  involving the musculoskeletal system  Other abnormalities of gait and mobility  Rationale for Evaluation and Treatment Rehabilitation  ONSET DATE: 01/01/22  SUBJECTIVE:   SUBJECTIVE STATEMENT: Pt states his Rt foot is feeling "cold" and tingling. Otherwise feeling "ok"  PERTINENT HISTORY: Denies any other medical problems   PAIN:  Are you having pain? Yes: NPRS scale: 3/10 Pain location: Rt LE  Pain description: aching and burning  Aggravating factors: sitting still > 30 min Relieving factors: propped up when lying down   PRECAUTIONS: Other: Rt UE compartment syndrome   WEIGHT BEARING RESTRICTIONS No  PATIENT GOALS toibe able to return to work    OBJECTIVE:   LOWER EXTREMITY ROM:    Lt LE ROM WFL's throughout  Active Assistive ROM supine Right eval Left eval  Hip flexion 85   Hip extension 0   Hip abduction 25   Hip adduction    Hip internal rotation    Hip external rotation    Knee flexion 100   Knee extension 0   Ankle dorsiflexion 10   Ankle plantarflexion    Ankle inversion    Ankle eversion     (Blank rows = not tested)  LOWER EXTREMITY MMT:    Lt LE WFL's throughout  MMT Right eval Left eval  Hip flexion 4-/5   Hip extension 3+/4   Hip abduction 3-/5   Hip adduction    Hip internal rotation    Hip external rotation  Knee flexion 4/5   Knee extension 4+/5   Ankle dorsiflexion 3-/5   Ankle plantarflexion    Ankle inversion    Ankle eversion     (Blank rows = not tested)    TODAY'S TREATMENT: 02/02/22 Nustep L6 x 5 min for warm up Sit <> stand 3 x 5 Tapping up 4'' step forward and laterally with Lt LE for Rt LE wt bearing x 20 each Hip abd, ext, HS curl with red TB 2 x 10 Heel raises x 20 Toe raises x 20  Bridge x 20 Clam red TB x 20 DF and Inv red TB 2 x 10 Eversion no resistance 2 x 10  Recumbent bike for LE strength and ROM x 5 min  Gait training without AD x 150' with CGA facilitation for wt shifts and cuing to increase  step length with Lt LE   Eval: Exercises: Bridging x 10 Hip abduction red TB alternating LE's x 10  Ankle DF supine x 10 Ankle DF sitting x 10  Sit to stand x 2  Standing weight shift with improved LE alignment     PATIENT EDUCATION:  Education details: POC HEP Person educated: Patient Education method: Programmer, multimedia, Demonstration, Tactile cues, Verbal cues, and Handouts Education comprehension: verbalized understanding, returned demonstration, verbal cues required, tactile cues required, and needs further education   HOME EXERCISE PROGRAM: Access Code: [email or web address] URL: [email or web address] Date: 01/27/2022 Prepared by: Corlis Leak  Exercises - Beginner Bridge  - 2 x daily - 7 x weekly - 1-2 sets - 10 reps - 3-5 sec  hold - Hooklying Isometric Clamshell  - 2 x daily - 7 x weekly - 1-2 sets - 10 reps - 3 sec  hold - Ankle Dorsiflexion with Resistance  - 2 x daily - 7 x weekly - 1-2 sets - 10 reps - 2-3 sec  hold - Supine Ankle Pumps  - 2 x daily - 7 x weekly - 1-2 sets - 10 reps - 2-3 sec  hold - Seated Ankle Dorsiflexion AROM  - 2 x daily - 7 x weekly - 1-2 sets - 10 reps - 2-3 sec  hold - Sit to Stand  - 2 x daily - 7 x weekly - 1 sets - 10 reps - 3-5 sec  hold - Standing Weight Shift  - 2 x daily - 7 x weekly - 1 sets - 20-30 reps - 2-3 sec  hold  ASSESSMENT:  CLINICAL IMPRESSION: Pt with improving ROM since eval. He had good tolerance to progression of exercises and gait without AD. Still limited by ankle weakness and difficulty with SLS and wt shifts to Rt   GOALS: Goals reviewed with patient? Yes   LONG TERM GOALS: Target date: 03/10/2022   Increase strength Rt LE to 4/5 to 5/5  Baseline:  Goal status: INITIAL  2.  Increase AROM Rt LE to WFL's allowing patient to use Rt LE for all functional activities  Baseline:  Goal status: INITIAL  3.  Independent in gait with improved gait pattern and least restrictive assistive device  Baseline:  Goal  status: INITIAL  4.  Independent in all transfers and transitional movements necessary for ALD's and return to work  Baseline:  Goal status: INITIAL  5.  Independent in HEP  Baseline:  Goal status: INITIAL   PLAN: PT FREQUENCY: 2x/week  PT DURATION: 6 weeks  PLANNED INTERVENTIONS: Therapeutic exercises, Therapeutic activity, Neuromuscular re-education, Balance training, Gait training, Patient/Family education, Self Care, Stair training, Dry Needling,  Electrical stimulation, Cryotherapy, Moist heat, Ultrasound, Manual therapy, and Re-evaluation  PLAN FOR NEXT SESSION: review and progress exercise and gait training as tolerated    Haddon Fyfe, PT 02/02/2022, 12:26 PM

## 2022-02-10 ENCOUNTER — Ambulatory Visit: Payer: Medicaid Other | Admitting: Physical Therapy

## 2022-02-10 ENCOUNTER — Encounter: Payer: Self-pay | Admitting: Physical Therapy

## 2022-02-10 DIAGNOSIS — M79604 Pain in right leg: Secondary | ICD-10-CM | POA: Diagnosis not present

## 2022-02-10 DIAGNOSIS — R2689 Other abnormalities of gait and mobility: Secondary | ICD-10-CM

## 2022-02-10 DIAGNOSIS — M6281 Muscle weakness (generalized): Secondary | ICD-10-CM

## 2022-02-10 DIAGNOSIS — R262 Difficulty in walking, not elsewhere classified: Secondary | ICD-10-CM | POA: Diagnosis not present

## 2022-02-10 DIAGNOSIS — T79A11A Traumatic compartment syndrome of right upper extremity, initial encounter: Secondary | ICD-10-CM | POA: Diagnosis not present

## 2022-02-10 DIAGNOSIS — R269 Unspecified abnormalities of gait and mobility: Secondary | ICD-10-CM | POA: Diagnosis not present

## 2022-02-10 DIAGNOSIS — R29898 Other symptoms and signs involving the musculoskeletal system: Secondary | ICD-10-CM

## 2022-02-10 NOTE — Therapy (Signed)
OUTPATIENT PHYSICAL THERAPY LOWER EXTREMITY TREATMENT   Patient Name: Chris Parker MRN: [id number] DOB:[birth year], 31 y.o., male Today's Date: 02/10/2022   PT End of Session - 02/10/22 1528     Visit Number 3    Number of Visits 12    Date for PT Re-Evaluation 03/16/22    PT Start Time 1445    PT Stop Time 1528    PT Time Calculation (min) 43 min    Activity Tolerance Patient tolerated treatment well    Behavior During Therapy [facility] for tasks assessed/performed               Past Medical History:  Diagnosis Date   IV drug abuse [facility])    Past Surgical History:  Procedure Laterality Date   FASCIECTOMY Right 01/08/2022   Procedure: FASCIECTOMY OF FOREARM AND HAND;  Surgeon: Marlyne Beards, MD;  [facility];  Service: Orthopedics;  Laterality: Right;  RIGHT HAND AND FOREARM   FASCIOTOMY Right 01/09/2022   Procedure: RIGHT FOREARM FASCIOTOMY; APPLICATION OF WOUND VAC;  Surgeon: Marlyne Beards, MD;  [facility];  Service: Orthopedics;  Laterality: Right;   I & D EXTREMITY Right 01/11/2022   Procedure: IRRIGATION AND DEBRIDEMENT RIGHT UPPER EXTREMITY;  Surgeon: Marlyne Beards, MD;  [facility];  Service: Orthopedics;  Laterality: Right;   I & D EXTREMITY Right 01/13/2022   Procedure: IRRIGATION AND DEBRIDEMENT forearm;  Surgeon: Marlyne Beards, MD;  [facility];  Service: Orthopedics;  Laterality: Right;   Patient Active Problem List   Diagnosis Date Noted   Compartment syndrome (HCC) 01/09/2022   SIRS (systemic inflammatory response syndrome) (HCC) 01/09/2022   Elevated troponin 01/09/2022   Hyperkalemia 01/09/2022   AKI (acute kidney injury) (HCC) 01/09/2022   Lactic acidosis 01/09/2022   Transaminitis 01/09/2022   IV drug abuse (HCC) 01/09/2022   Rhabdomyolysis 01/08/2022    PCP: None  REFERRING PROVIDER: Merlene Laughter DO  REFERRING DIAG: Rt LE weakness   THERAPY DIAG:  Muscle weakness (generalized)  Other symptoms and signs  involving the musculoskeletal system  Other abnormalities of gait and mobility  Rationale for Evaluation and Treatment Rehabilitation  ONSET DATE: 01/01/22  SUBJECTIVE:   SUBJECTIVE STATEMENT: Pt states he has been able to walk around his house without the cane. Burning in his foot occurs mostly at night  PERTINENT HISTORY: Denies any other medical problems   PAIN:  Are you having pain? Yes: NPRS scale: 3/10 Pain location: Rt foot Pain description: aching and burning  Aggravating factors: sitting still > 30 min Relieving factors: propped up when lying down   PRECAUTIONS: Other: Rt UE compartment syndrome   WEIGHT BEARING RESTRICTIONS No  PATIENT GOALS toibe able to return to work    OBJECTIVE:   LOWER EXTREMITY ROM:    Lt LE ROM WFL's throughout  Active Assistive ROM supine Right eval Left eval  Hip flexion 85   Hip extension 0   Hip abduction 25   Hip adduction    Hip internal rotation    Hip external rotation    Knee flexion 100   Knee extension 0   Ankle dorsiflexion 10   Ankle plantarflexion    Ankle inversion    Ankle eversion     (Blank rows = not tested)  LOWER EXTREMITY MMT:    Lt LE WFL's throughout  MMT Right eval Left eval 10/25  Hip flexion 4-/5    Hip extension 3+/4    Hip abduction 3-/5    Hip  adduction     Hip internal rotation     Hip external rotation     Knee flexion 4/5    Knee extension 4+/5    Ankle dorsiflexion 3-/5  3/5  Ankle plantarflexion   3/5  Ankle inversion     Ankle eversion      (Blank rows = not tested)    TODAY'S TREATMENT: 02/10/22 Nustep L6 x 5 min for warm up  Heel raises x 20 Toe raises x 20 Step up 2 x 10 6'' step forward and laterally without UE support Leg press bilat LE 6 plates x 10, single leg 3 plates x 10 Sit <> stand 2 x 10 without UE support Tapping 6'' step forward x 15 and then laterally x 15 with focus on stance phase for Rt LE   Ankle 4 way red TB 2 x 10  Bridge x 20 Clam red TB x  30  02/02/22 Nustep L6 x 5 min for warm up Sit <> stand 3 x 5 Tapping up 4'' step forward and laterally with Lt LE for Rt LE wt bearing x 20 each Hip abd, ext, HS curl with red TB 2 x 10 Heel raises x 20 Toe raises x 20  Bridge x 20 Clam red TB x 20 DF and Inv red TB 2 x 10 Eversion no resistance 2 x 10  Recumbent bike for LE strength and ROM x 5 min  Gait training without AD x 150' with CGA facilitation for wt shifts and cuing to increase step length with Lt LE    PATIENT EDUCATION:  Education details: POC HEP Person educated: Patient Education method: Explanation, Demonstration, Tactile cues, Verbal cues, and Handouts Education comprehension: verbalized understanding, returned demonstration, verbal cues required, tactile cues required, and needs further education   HOME EXERCISE PROGRAM: Access Code: [email or web address] URL: [email or web address] Date: 01/27/2022 Prepared by: Gillermo Murdoch  Exercises - Beginner Bridge  - 2 x daily - 7 x weekly - 1-2 sets - 10 reps - 3-5 sec  hold - Hooklying Isometric Clamshell  - 2 x daily - 7 x weekly - 1-2 sets - 10 reps - 3 sec  hold - Ankle Dorsiflexion with Resistance  - 2 x daily - 7 x weekly - 1-2 sets - 10 reps - 2-3 sec  hold - Supine Ankle Pumps  - 2 x daily - 7 x weekly - 1-2 sets - 10 reps - 2-3 sec  hold - Seated Ankle Dorsiflexion AROM  - 2 x daily - 7 x weekly - 1-2 sets - 10 reps - 2-3 sec  hold - Sit to Stand  - 2 x daily - 7 x weekly - 1 sets - 10 reps - 3-5 sec  hold - Standing Weight Shift  - 2 x daily - 7 x weekly - 1 sets - 20-30 reps - 2-3 sec  hold  ASSESSMENT:  CLINICAL IMPRESSION: Pt is improving ankle and LE strength. He is able to ambulate without device throughout clinic during session. Good tolerance to all exercises today   GOALS: Goals reviewed with patient? Yes   LONG TERM GOALS: Target date: 03/10/2022   Increase strength Rt LE to 4/5 to 5/5  Baseline:  Goal status: INITIAL  2.  Increase  AROM Rt LE to WFL's allowing patient to use Rt LE for all functional activities  Baseline:  Goal status: INITIAL  3.  Independent in gait with improved gait pattern and least restrictive assistive device  Baseline:  Goal  status: INITIAL  4.  Independent in all transfers and transitional movements necessary for ALD's and return to work  Baseline:  Goal status: INITIAL  5.  Independent in HEP  Baseline:  Goal status: INITIAL   PLAN: PT FREQUENCY: 2x/week  PT DURATION: 6 weeks  PLANNED INTERVENTIONS: Therapeutic exercises, Therapeutic activity, Neuromuscular re-education, Balance training, Gait training, Patient/Family education, Self Care, Stair training, Dry Needling, Electrical stimulation, Cryotherapy, Moist heat, Ultrasound, Manual therapy, and Re-evaluation  PLAN FOR NEXT SESSION: LE strength, gait, balance   Dontrae Morini, PT 02/10/2022, 3:29 PM

## 2022-02-15 ENCOUNTER — Ambulatory Visit
Admission: EM | Admit: 2022-02-15 | Discharge: 2022-02-15 | Disposition: A | Discharge status: Home / Self Care | Payer: Medicaid Other

## 2022-02-15 ENCOUNTER — Ambulatory Visit (INDEPENDENT_AMBULATORY_CARE_PROVIDER_SITE_OTHER): Payer: Medicaid Other

## 2022-02-15 DIAGNOSIS — M79671 Pain in right foot: Secondary | ICD-10-CM | POA: Diagnosis not present

## 2022-02-15 NOTE — Discharge Instructions (Addendum)
Advised patient of right foot x-ray results with hard copy provided to patient.  Advised patient if right foot pain worsens and/or unresolved please go to nearest ED for further evaluation.

## 2022-02-15 NOTE — ED Provider Notes (Signed)
Chris Parker CARE    CSN: [id number] Arrival date & time: 02/15/22  1431      History   Chief Complaint Chief Complaint  Patient presents with   Foot Pain    RT    HPI Chris Parker is a 31 y.o. male.   HPI 31 year old male presents with right foot pain reports 10 out of 10 pain.  PMH significant for hyperkalemia, compartment syndrome, and IV drug use.  Past Medical History:  Diagnosis Date   IV drug abuse [facility])     Patient Active Problem List   Diagnosis Date Noted   Compartment syndrome (HCC) 01/09/2022   SIRS (systemic inflammatory response syndrome) (HCC) 01/09/2022   Elevated troponin 01/09/2022   Hyperkalemia 01/09/2022   AKI (acute kidney injury) (HCC) 01/09/2022   Lactic acidosis 01/09/2022   Transaminitis 01/09/2022   IV drug abuse (HCC) 01/09/2022   Rhabdomyolysis 01/08/2022    Past Surgical History:  Procedure Laterality Date   FASCIECTOMY Right 01/08/2022   Procedure: FASCIECTOMY OF FOREARM AND HAND;  Surgeon: Marlyne Beards, MD;  [facility];  Service: Orthopedics;  Laterality: Right;  RIGHT HAND AND FOREARM   FASCIOTOMY Right 01/09/2022   Procedure: RIGHT FOREARM FASCIOTOMY; APPLICATION OF WOUND VAC;  Surgeon: Marlyne Beards, MD;  [facility];  Service: Orthopedics;  Laterality: Right;   I & D EXTREMITY Right 01/11/2022   Procedure: IRRIGATION AND DEBRIDEMENT RIGHT UPPER EXTREMITY;  Surgeon: Marlyne Beards, MD;  [facility];  Service: Orthopedics;  Laterality: Right;   I & D EXTREMITY Right 01/13/2022   Procedure: IRRIGATION AND DEBRIDEMENT forearm;  Surgeon: Marlyne Beards, MD;  [facility];  Service: Orthopedics;  Laterality: Right;       Home Medications    Prior to Admission medications   Medication Sig Start Date End Date Taking? Authorizing Provider  cephALEXin (KEFLEX) 500 MG capsule Take 1 capsule 4 times a day by oral route for 14 days. 02/10/22  Yes [provider]  oxyCODONE (OXY  IR/ROXICODONE) 5 MG immediate release tablet Take 1 tablet every 6 hours by oral route for 7 days. 02/12/22  Yes [provider]  traMADol (ULTRAM) 50 MG tablet TAKE 1 TABLET BY MOUTH EVERY 4-6 FOR 7 DAYS 02/08/22  Yes [provider]  acetaminophen (TYLENOL) 325 MG tablet Take 2 tablets (650 mg total) by mouth every 6 (six) hours as needed for mild pain (or Fever >/= 101). 01/23/22   Sheikh, Kateri Mc Latif, DO  polyethylene glycol (MIRALAX / GLYCOLAX) 17 g packet Take 17 g by mouth daily. 01/24/22   Sheikh, Omair Latif, DO  senna-docusate (SENOKOT-S) 8.6-50 MG tablet Take 1 tablet by mouth at bedtime. 01/23/22   Merlene Laughter, DO    Family History History reviewed. No pertinent family history.  Social History Social History   Tobacco Use   Smoking status: Some Days    Types: Cigarettes  Substance Use Topics   Alcohol use: Not Currently   Drug use: Yes    Comment: IV drug use     Allergies   Patient has no known allergies.   Review of Systems Review of Systems  Musculoskeletal:        Right foot pain x3 weeks  All other systems reviewed and are negative.    Physical Exam Triage Vital Signs ED Triage Vitals  Enc Vitals Group     BP 02/15/22 1448 118/83     Pulse Rate 02/15/22 1448 (!) 119     Resp  02/15/22 1448 18     Temp 02/15/22 1448 98.4 F (36.9 C)     Temp Source 02/15/22 1448 Oral     SpO2 02/15/22 1448 99 %     Weight --      Height --      Head Circumference --      Peak Flow --      Pain Score 02/15/22 1449 10     Pain Loc --      Pain Edu? --      Excl. in GC? --    No data found.  Updated Vital Signs BP 118/83 (BP Location: Left Arm)   Pulse (!) 119   Temp 98.4 F (36.9 C) (Oral)   Resp 18   SpO2 99%     Physical Exam Vitals and nursing note reviewed.  Constitutional:      Appearance: Normal appearance. He is normal weight.  HENT:     Head: Normocephalic and atraumatic.     Mouth/Throat:     Mouth: Mucous membranes  are moist.     Pharynx: Oropharynx is clear.  Eyes:     Extraocular Movements: Extraocular movements intact.     Conjunctiva/sclera: Conjunctivae normal.     Pupils: Pupils are equal, round, and reactive to light.  Cardiovascular:     Rate and Rhythm: Normal rate and regular rhythm.     Pulses: Normal pulses.     Heart sounds: Normal heart sounds.  Pulmonary:     Effort: Pulmonary effort is normal.     Breath sounds: Normal breath sounds.  Musculoskeletal:        General: Normal range of motion.     Cervical back: Normal range of motion and neck supple.     Comments: Right foot (dorsum): TTP over lateral aspect of fourth/fifth metatarsals  Skin:    General: Skin is warm and dry.  Neurological:     General: No focal deficit present.     Mental Status: He is alert and oriented to person, place, and time.      UC Treatments / Results  Labs (all labs ordered are listed, but only abnormal results are displayed) Labs Reviewed - No data to display  EKG   Radiology DG Foot Complete Right  Result Date: 02/15/2022 CLINICAL DATA:  RT foot pain x 2 days. Pain 10/10 EXAM: RIGHT FOOT COMPLETE - 3+ VIEW COMPARISON:  None Available. FINDINGS: Normal alignment. No acute fracture. Small calcaneal plantar enthesophyte. Normal mineralization. The soft tissues are unremarkable. IMPRESSION: No acute osseous abnormality. Electronically Signed   By: Olive Bass M.D.   On: 02/15/2022 15:17    Procedures Procedures (including critical care time)  Medications Ordered in UC Medications - No data to display  Initial Impression / Assessment and Plan / UC Course  I have reviewed the triage vital signs and the nursing notes.  Pertinent labs & imaging results that were available during my care of the patient were reviewed by me and considered in my medical decision making (see chart for details).     MDM: 1.  Right foot pain-right foot x-ray revealed above. Advised patient of right foot x-ray  results with hard copy provided to patient.  Advised patient if right foot pain worsens and/or unresolved please go to nearest ED for further evaluation.  Patient discharged home, hemodynamically stable. Final Clinical Impressions(s) / UC Diagnoses   Final diagnoses:  Right foot pain     Discharge Instructions  Advised patient of right foot x-ray results with hard copy provided to patient.  Advised patient if right foot pain worsens and/or unresolved please go to nearest ED for further evaluation.     ED Prescriptions   None    PDMP not reviewed this encounter.   Eliezer Lofts, [place] 02/15/22 1609

## 2022-02-15 NOTE — ED Triage Notes (Signed)
Pt c/o RT foot pain x 3 weeks. Was hospitalized in late sept/early oct for compartment syndrome. Pain worsening in last week. Red and swollen per pt. Pain 10/10 No PMH of gout. Currently on keflex since last Wed. Also taking oxycodone and tramadol w no relief.

## 2022-02-23 NOTE — Therapy (Incomplete)
OUTPATIENT PHYSICAL THERAPY LOWER EXTREMITY TREATMENT   Patient Name: Chris Parker MRN: [id number] DOB:Oct 09, 1990, 31 y.o., male Today's Date: 02/25/2022   PT End of Session - 02/25/22 1405     Visit Number 4    Number of Visits 12    Date for PT Re-Evaluation 04/08/22    Authorization Time Period 02/25/22 to 04/08/22    Authorization - Visit Number 1    Authorization - Number of Visits 8    PT Start Time 1405    PT Stop Time 1447    PT Time Calculation (min) 42 min    Activity Tolerance Patient tolerated treatment well    Behavior During Therapy [facility] for tasks assessed/performed               Past Medical History:  Diagnosis Date   IV drug abuse [facility])    Past Surgical History:  Procedure Laterality Date   FASCIECTOMY Right 01/08/2022   Procedure: FASCIECTOMY OF FOREARM AND HAND;  Surgeon: Marlyne Beards, MD;  [facility];  Service: Orthopedics;  Laterality: Right;  RIGHT HAND AND FOREARM   FASCIOTOMY Right 01/09/2022   Procedure: RIGHT FOREARM FASCIOTOMY; APPLICATION OF WOUND VAC;  Surgeon: Marlyne Beards, MD;  [facility];  Service: Orthopedics;  Laterality: Right;   I & D EXTREMITY Right 01/11/2022   Procedure: IRRIGATION AND DEBRIDEMENT RIGHT UPPER EXTREMITY;  Surgeon: Marlyne Beards, MD;  [facility];  Service: Orthopedics;  Laterality: Right;   I & D EXTREMITY Right 01/13/2022   Procedure: IRRIGATION AND DEBRIDEMENT forearm;  Surgeon: Marlyne Beards, MD;  [facility];  Service: Orthopedics;  Laterality: Right;   Patient Active Problem List   Diagnosis Date Noted   Compartment syndrome (HCC) 01/09/2022   SIRS (systemic inflammatory response syndrome) (HCC) 01/09/2022   Elevated troponin 01/09/2022   Hyperkalemia 01/09/2022   AKI (acute kidney injury) (HCC) 01/09/2022   Lactic acidosis 01/09/2022   Transaminitis 01/09/2022   IV drug abuse (HCC) 01/09/2022   Rhabdomyolysis 01/08/2022    PCP: None  REFERRING PROVIDER: Merlene Laughter DO  REFERRING DIAG: Rt LE weakness   THERAPY DIAG:  Muscle weakness (generalized)  Other symptoms and signs involving the musculoskeletal system  Other abnormalities of gait and mobility  Rationale for Evaluation and Treatment Rehabilitation  ONSET DATE: 01/01/22  SUBJECTIVE:   SUBJECTIVE STATEMENT: My leg feels like it's getting stronger. I was walking with a cane but now I can walk without it.  PERTINENT HISTORY: Denies any other medical problems   PAIN:  Are you having pain? Yes: NPRS scale: 4/10 Pain location: Rt foot Pain description: aching and burning  Aggravating factors: sitting still > 30 min Relieving factors: propped up when lying down   PRECAUTIONS: Other: Rt UE compartment syndrome   WEIGHT BEARING RESTRICTIONS No  PATIENT GOALS toibe able to return to work    OBJECTIVE:   LOWER EXTREMITY ROM:    Lt LE ROM WFL's throughout  Active Assistive ROM supine Right eval Right  02/25/22  Hip flexion 85 105  Hip extension 0 28  Hip abduction 25 43 (left 48)  Hip adduction    Hip internal rotation    Hip external rotation    Knee flexion 100 133  Knee extension 0 full  Ankle dorsiflexion 10   Ankle plantarflexion    Ankle inversion    Ankle eversion     (Blank rows = not tested)  LOWER EXTREMITY MMT:    Lt LE WFL's throughout  MMT Right eval Left eval 10/25 02/25/22  Hip flexion 4-/5   5/5  Hip extension 3+/4   5/5  Hip abduction 3-/5   5/5  Hip adduction      Hip internal rotation      Hip external rotation      Knee flexion 4/5   4/5  Knee extension 4+/5   5/5  Ankle dorsiflexion 3-/5  3/5 3/5 limited by ROM  Ankle plantarflexion   3/5 Unable to do single leg heel raise  Ankle inversion      Ankle eversion       (Blank rows = not tested)  FUNCTIONAL TESTS:  SL Squat R - requires one UE assist and limited by ankle DF  SL Squat L - able to do with one UE assist  TODAY'S TREATMENT: 02/25/22  Nustep L6 x 5 min for warm up Heel  raises x 20, second set with tennis ball between heels  Toe raises x 20 done with back against wall to avoid hip  compensation  Seated soleus stretch 2 x 30 sec   Gastroc runners stretch 2x 30 sec (attempted sitting with strap  but standing better) Prone knee flexion with YTB for full range 2x10 Sit to stand with GTB 2 x 10 .  02/10/22 Nustep L6 x 5 min for warm up  Heel raises x 20 Toe raises x 20 Step up 2 x 10 6'' step forward and laterally without UE support Leg press bilat LE 6 plates x 10, single leg 3 plates x 10 Sit <> stand 2 x 10 without UE support Tapping 6'' step forward x 15 and then laterally x 15 with focus on stance phase for Rt LE   Ankle 4 way red TB 2 x 10  Bridge x 20 Clam red TB x 30  02/02/22 Nustep L6 x 5 min for warm up Sit <> stand 3 x 5 Tapping up 4'' step forward and laterally with Lt LE for Rt LE wt bearing x 20 each Hip abd, ext, HS curl with red TB 2 x 10 Heel raises x 20 Toe raises x 20  Bridge x 20 Clam red TB x 20 DF and Inv red TB 2 x 10 Eversion no resistance 2 x 10  Recumbent bike for LE strength and ROM x 5 min  Gait training without AD x 150' with CGA facilitation for wt shifts and cuing to increase step length with Lt LE    PATIENT EDUCATION:  Education details: HEP update Person educated: Patient Education method: Explanation, Demonstration, Tactile cues, Verbal cues, and Handouts Education comprehension: verbalized understanding, returned demonstration, verbal cues required, tactile cues required, and needs further education   HOME EXERCISE PROGRAM: Access Code: [email or web address] URL: [email or web address] Date: 02/25/2022 Prepared by: Raynelle Fanning  Exercises - Beginner Bridge  - 2 x daily - 7 x weekly - 1-2 sets - 10 reps - 3-5 sec  hold - Hooklying Isometric Clamshell  - 2 x daily - 7 x weekly - 1-2 sets - 10 reps - 3 sec  hold - Ankle Dorsiflexion with Resistance  - 2 x daily - 7 x weekly - 1-2 sets - 10 reps - 2-3 sec   hold - Supine Ankle Pumps  - 2 x daily - 7 x weekly - 1-2 sets - 10 reps - 2-3 sec  hold - Seated Ankle Dorsiflexion AROM  - 2 x daily - 7 x weekly - 1-2 sets - 10 reps - 2-3 sec  hold -  Sit to Stand  - 2 x daily - 7 x weekly - 1 sets - 10 reps - 3-5 sec  hold - Standing Weight Shift  - 2 x daily - 7 x weekly - 1 sets - 20-30 reps - 2-3 sec  hold - Prone Hamstring Curl with Anchored Resistance  - 1 x daily - 4 x weekly - 3 sets - 10 reps - Gastroc Stretch on Wall  - 2 x daily - 7 x weekly - 1 sets - 3 reps - 30-60 sec hold - Seated Dorsiflexion Stretch  - 2 x daily - 7 x weekly - 1 sets - 3 reps - 30 sec  hold - Heel Toe Raises with Counter Support  - 1 x daily - 7 x weekly - 2 sets - 10 reps  ASSESSMENT:  CLINICAL IMPRESSION: Marino has made significant progress toward his LTGs since his initial evaluation. He continues to have marked deficits in right ankle ROM and strength, Knee strength, gait and transitional movements required for his job. He will benefit from continued skilled PT to address these deficits.   GOALS: Goals reviewed with patient? Yes   LONG TERM GOALS: Target date: 04/08/2022   Increase strength Rt LE to 4/5 to 5/5  Baseline: 3- to 4+/5 Goal status: IN PROGRESS  2.  Increase AROM Rt LE to WFL's allowing patient to use Rt LE for all functional activities  Baseline: hip, knee and ankle limitations Goal status: IN PROGRESS  3.  Independent in gait with improved gait pattern and least restrictive assistive device  Baseline: Decreased stance time R LE and trunk lean R Goal status: IN PROGRESS  4.  Independent in all transfers and transitional movements necessary for ALD's and return to work  Baseline: unable to do floor to stand transfer from R LE Goal status: IN PROGRESS  5.  Independent in HEP  Baseline: no HeP Goal status: IN PROGRESS   PLAN: PT FREQUENCY: 2x/week  PT DURATION: 6 weeks  PLANNED INTERVENTIONS: Therapeutic exercises, Therapeutic  activity, Neuromuscular re-education, Balance training, Gait training, Patient/Family education, Self Care, Stair training, Dry Needling, Electrical stimulation, Cryotherapy, Moist heat, Ultrasound, Manual therapy, and Re-evaluation  PLAN FOR NEXT SESSION: LE strength, gait, balance   Supriya Beaston, PT 02/25/2022, 4:22 PM

## 2022-02-25 ENCOUNTER — Ambulatory Visit: Payer: Medicaid Other | Attending: Internal Medicine | Admitting: Physical Therapy

## 2022-02-25 ENCOUNTER — Encounter: Payer: Self-pay | Admitting: Physical Therapy

## 2022-02-25 DIAGNOSIS — R2689 Other abnormalities of gait and mobility: Secondary | ICD-10-CM | POA: Diagnosis present

## 2022-02-25 DIAGNOSIS — R29898 Other symptoms and signs involving the musculoskeletal system: Secondary | ICD-10-CM | POA: Diagnosis present

## 2022-02-25 DIAGNOSIS — M6281 Muscle weakness (generalized): Secondary | ICD-10-CM | POA: Diagnosis not present

## 2022-02-25 NOTE — Addendum Note (Signed)
Addended by: Val Riles on: 02/25/2022 04:48 PM   Modules accepted: Orders

## 2022-03-03 ENCOUNTER — Ambulatory Visit: Payer: Medicaid Other | Admitting: Physical Therapy

## 2022-03-03 ENCOUNTER — Encounter: Payer: Self-pay | Admitting: Physical Therapy

## 2022-03-03 DIAGNOSIS — R2689 Other abnormalities of gait and mobility: Secondary | ICD-10-CM

## 2022-03-03 DIAGNOSIS — M6281 Muscle weakness (generalized): Secondary | ICD-10-CM

## 2022-03-03 DIAGNOSIS — R29898 Other symptoms and signs involving the musculoskeletal system: Secondary | ICD-10-CM

## 2022-03-03 NOTE — Therapy (Signed)
OUTPATIENT PHYSICAL THERAPY LOWER EXTREMITY TREATMENT   Patient Name: Chris Parker MRN: [id number] DOB:Sep 04, 1990, 31 y.o., male Today's Date: 03/03/2022   PT End of Session - 03/03/22 1140     Visit Number 5    Number of Visits 12    Date for PT Re-Evaluation 04/08/22    Authorization - Visit Number 2    Authorization - Number of Visits 8    PT Start Time 1100    PT Stop Time 1138    PT Time Calculation (min) 38 min    Activity Tolerance Patient tolerated treatment well    Behavior During Therapy WFL for tasks assessed/performed                Past Medical History:  Diagnosis Date   IV drug abuse ([place])    Past Surgical History:  Procedure Laterality Date   FASCIECTOMY Right 01/08/2022   Procedure: FASCIECTOMY OF FOREARM AND HAND;  Surgeon: Sherilyn Cooter, MD;  [facility];  Service: Orthopedics;  Laterality: Right;  RIGHT HAND AND FOREARM   FASCIOTOMY Right 01/09/2022   Procedure: RIGHT FOREARM FASCIOTOMY; APPLICATION OF WOUND VAC;  Surgeon: Sherilyn Cooter, MD;  [facility];  Service: Orthopedics;  Laterality: Right;   I & D EXTREMITY Right 01/11/2022   Procedure: IRRIGATION AND DEBRIDEMENT RIGHT UPPER EXTREMITY;  Surgeon: Sherilyn Cooter, MD;  [facility];  Service: Orthopedics;  Laterality: Right;   I & D EXTREMITY Right 01/13/2022   Procedure: IRRIGATION AND DEBRIDEMENT forearm;  Surgeon: Sherilyn Cooter, MD;  [facility];  Service: Orthopedics;  Laterality: Right;   Patient Active Problem List   Diagnosis Date Noted   Compartment syndrome ([place]) 01/09/2022   SIRS (systemic inflammatory response syndrome) ([place]) 01/09/2022   Elevated troponin 01/09/2022   Hyperkalemia 01/09/2022   AKI (acute kidney injury) ([place]) 01/09/2022   Lactic acidosis 01/09/2022   Transaminitis 01/09/2022   IV drug abuse ([place]) 01/09/2022   Rhabdomyolysis 01/08/2022    PCP: None  REFERRING PROVIDER: Kerney Elbe DO  REFERRING DIAG: Rt LE weakness    THERAPY DIAG:  Muscle weakness (generalized)  Other symptoms and signs involving the musculoskeletal system  Other abnormalities of gait and mobility  Rationale for Evaluation and Treatment Rehabilitation  ONSET DATE: 01/01/22  SUBJECTIVE:   SUBJECTIVE STATEMENT: Pt states he still has burning pain in his foot. He feels he is getting stronger  PERTINENT HISTORY: Denies any other medical problems   PAIN:  Are you having pain? Yes: NPRS scale: 4/10 Pain location: Rt foot Pain description: aching and burning  Aggravating factors: sitting still > 30 min Relieving factors: propped up when lying down   PRECAUTIONS: Other: Rt UE compartment syndrome   WEIGHT BEARING RESTRICTIONS No  PATIENT GOALS toibe able to return to work    OBJECTIVE:   LOWER EXTREMITY ROM:    Lt LE ROM WFL's throughout  Active Assistive ROM supine Right eval Right  02/25/22  Hip flexion 85 105  Hip extension 0 28  Hip abduction 25 43 (left 48)  Hip adduction    Hip internal rotation    Hip external rotation    Knee flexion 100 133  Knee extension 0 full  Ankle dorsiflexion 10   Ankle plantarflexion    Ankle inversion    Ankle eversion     (Blank rows = not tested)  LOWER EXTREMITY MMT:    Lt LE WFL's throughout  MMT Right eval Left eval 10/25 02/25/22  Hip flexion 4-/5  5/5  Hip extension 3+/4   5/5  Hip abduction 3-/5   5/5  Hip adduction      Hip internal rotation      Hip external rotation      Knee flexion 4/5   4/5  Knee extension 4+/5   5/5  Ankle dorsiflexion 3-/5  3/5 3/5 limited by ROM  Ankle plantarflexion   3/5 Unable to do single leg heel raise  Ankle inversion      Ankle eversion       (Blank rows = not tested)  FUNCTIONAL TESTS:  SL Squat R - requires one UE assist and limited by ankle DF  SL Squat L - able to do with one UE assist  TODAY'S TREATMENT: [facility] Adult PT Treatment:                                                DATE: 03/03/22 Therapeutic  Exercise: Recumbent bike x 5 min L1 Heel raises x 20 each toes in, out, straight Gastroc stretch 2 x 20 sec Soleus stretch 2 x 20 sec Toe raises against wall x 20 Side step red TB around feet 20' x 4 Hip 3 way with slider focus on Rt LE stance phase x 10 Leg press DL 6 plates x 10, SL 3 plates 2 x 10 Rockerboard A/P and laterally x 1 min each Tall kneeling to standing with 1 UE support x 5 focus on Rt LE strengthening   02/25/22  Nustep L6 x 5 min for warm up Heel raises x 20, second set with tennis ball between heels  Toe raises x 20 done with back against wall to avoid hip  compensation  Seated soleus stretch 2 x 30 sec   Gastroc runners stretch 2x 30 sec (attempted sitting with strap  but standing better) Prone knee flexion with YTB for full range 2x10 Sit to stand with GTB 2 x 10 .  02/10/22 Nustep L6 x 5 min for warm up  Heel raises x 20 Toe raises x 20 Step up 2 x 10 6'' step forward and laterally without UE support Leg press bilat LE 6 plates x 10, single leg 3 plates x 10 Sit <> stand 2 x 10 without UE support Tapping 6'' step forward x 15 and then laterally x 15 with focus on stance phase for Rt LE   Ankle 4 way red TB 2 x 10  Bridge x 20 Clam red TB x 30    PATIENT EDUCATION:  Education details: HEP update Person educated: Patient Education method: Explanation, Demonstration, Tactile cues, Verbal cues, and Handouts Education comprehension: verbalized understanding, returned demonstration, verbal cues required, tactile cues required, and needs further education   HOME EXERCISE PROGRAM: Access Code: [email or web address] URL: [email or web address] Date: 02/25/2022 Prepared by: Raynelle Fanning  Exercises - Beginner Bridge  - 2 x daily - 7 x weekly - 1-2 sets - 10 reps - 3-5 sec  hold - Hooklying Isometric Clamshell  - 2 x daily - 7 x weekly - 1-2 sets - 10 reps - 3 sec  hold - Ankle Dorsiflexion with Resistance  - 2 x daily - 7 x weekly - 1-2 sets - 10 reps - 2-3  sec  hold - Supine Ankle Pumps  - 2 x daily - 7 x weekly - 1-2 sets - 10 reps - 2-3 sec  hold - Seated Ankle Dorsiflexion AROM  - 2 x daily - 7 x weekly - 1-2 sets - 10 reps - 2-3 sec  hold - Sit to Stand  - 2 x daily - 7 x weekly - 1 sets - 10 reps - 3-5 sec  hold - Standing Weight Shift  - 2 x daily - 7 x weekly - 1 sets - 20-30 reps - 2-3 sec  hold - Prone Hamstring Curl with Anchored Resistance  - 1 x daily - 4 x weekly - 3 sets - 10 reps - Gastroc Stretch on Wall  - 2 x daily - 7 x weekly - 1 sets - 3 reps - 30-60 sec hold - Seated Dorsiflexion Stretch  - 2 x daily - 7 x weekly - 1 sets - 3 reps - 30 sec  hold - Heel Toe Raises with Counter Support  - 1 x daily - 7 x weekly - 2 sets - 10 reps  ASSESSMENT:  CLINICAL IMPRESSION: Pt progressing well. Able to add leg press today with good tolerance. Pt continues with Rt LE strength deficits and will continue to benefit from skilled PT to work towards LTGs   GOALS: Goals reviewed with patient? Yes   LONG TERM GOALS: Target date: 04/08/2022   Increase strength Rt LE to 4/5 to 5/5  Baseline: 3- to 4+/5 Goal status: IN PROGRESS  2.  Increase AROM Rt LE to WFL's allowing patient to use Rt LE for all functional activities  Baseline: hip, knee and ankle limitations Goal status: IN PROGRESS  3.  Independent in gait with improved gait pattern and least restrictive assistive device  Baseline: Decreased stance time R LE and trunk lean R Goal status: IN PROGRESS  4.  Independent in all transfers and transitional movements necessary for ALD's and return to work  Baseline: unable to do floor to stand transfer from R LE Goal status: IN PROGRESS  5.  Independent in HEP  Baseline: no HeP Goal status: IN PROGRESS   PLAN: PT FREQUENCY: 2x/week  PT DURATION: 6 weeks  PLANNED INTERVENTIONS: Therapeutic exercises, Therapeutic activity, Neuromuscular re-education, Balance training, Gait training, Patient/Family education, Self Care, Stair  training, Dry Needling, Electrical stimulation, Cryotherapy, Moist heat, Ultrasound, Manual therapy, and Re-evaluation  PLAN FOR NEXT SESSION: LE strength, gait, balance   Amyre Segundo, PT 03/03/2022, 11:41 AM

## 2022-03-08 ENCOUNTER — Encounter: Payer: Self-pay | Admitting: Physical Therapy

## 2022-03-08 ENCOUNTER — Ambulatory Visit: Payer: Medicaid Other | Admitting: Physical Therapy

## 2022-03-08 DIAGNOSIS — R2689 Other abnormalities of gait and mobility: Secondary | ICD-10-CM

## 2022-03-08 DIAGNOSIS — R29898 Other symptoms and signs involving the musculoskeletal system: Secondary | ICD-10-CM

## 2022-03-08 DIAGNOSIS — M6281 Muscle weakness (generalized): Secondary | ICD-10-CM

## 2022-03-08 NOTE — Therapy (Signed)
OUTPATIENT PHYSICAL THERAPY LOWER EXTREMITY TREATMENT   Patient Name: Nahuel Wilbert MRN: [id number] DOB:10-10-1990, 31 y.o., male Today's Date: 03/08/2022   PT End of Session - 03/08/22 1439     Visit Number 6    Number of Visits 12    Date for PT Re-Evaluation 04/08/22    Authorization Time Period 02/25/22 to 04/08/22    Authorization - Visit Number 3    Authorization - Number of Visits 8    PT Start Time 1358    PT Stop Time 1436    PT Time Calculation (min) 38 min    Activity Tolerance Patient tolerated treatment well    Behavior During Therapy [facility] for tasks assessed/performed                 Past Medical History:  Diagnosis Date   IV drug abuse [facility])    Past Surgical History:  Procedure Laterality Date   FASCIECTOMY Right 01/08/2022   Procedure: FASCIECTOMY OF FOREARM AND HAND;  Surgeon: Marlyne Beards, MD;  Location: [facility];  Service: Orthopedics;  Laterality: Right;  RIGHT HAND AND FOREARM   FASCIOTOMY Right 01/09/2022   Procedure: RIGHT FOREARM FASCIOTOMY; APPLICATION OF WOUND VAC;  Surgeon: Marlyne Beards, MD;  Location: [facility];  Service: Orthopedics;  Laterality: Right;   I & D EXTREMITY Right 01/11/2022   Procedure: IRRIGATION AND DEBRIDEMENT RIGHT UPPER EXTREMITY;  Surgeon: Marlyne Beards, MD;  Location: [facility];  Service: Orthopedics;  Laterality: Right;   I & D EXTREMITY Right 01/13/2022   Procedure: IRRIGATION AND DEBRIDEMENT forearm;  Surgeon: Marlyne Beards, MD;  Location: [facility];  Service: Orthopedics;  Laterality: Right;   Patient Active Problem List   Diagnosis Date Noted   Compartment syndrome (HCC) 01/09/2022   SIRS (systemic inflammatory response syndrome) (HCC) 01/09/2022   Elevated troponin 01/09/2022   Hyperkalemia 01/09/2022   AKI (acute kidney injury) (HCC) 01/09/2022   Lactic acidosis 01/09/2022   Transaminitis 01/09/2022   IV drug abuse (HCC) 01/09/2022   Rhabdomyolysis 01/08/2022    PCP: None  REFERRING PROVIDER: Merlene Laughter DO  REFERRING DIAG: Rt LE weakness   THERAPY DIAG:  Muscle weakness (generalized)  Other symptoms and signs involving the musculoskeletal system  Other abnormalities of gait and mobility  Rationale for Evaluation and Treatment Rehabilitation  ONSET DATE: 01/01/22  SUBJECTIVE:   SUBJECTIVE STATEMENT: Pt states he has had more burning and "nerve pain" in his foot lately. He continues to feel stronger  PERTINENT HISTORY: Denies any other medical problems   PAIN:  Are you having pain? Yes: NPRS scale: 5/10 Pain location: Rt foot Pain description: aching and burning  Aggravating factors: sitting still > 30 min Relieving factors: propped up when lying down   PRECAUTIONS: Other: Rt UE compartment syndrome   PATIENT GOALS toibe able to return to work    OBJECTIVE:   LOWER EXTREMITY ROM:    Lt LE ROM WFL's throughout  Active Assistive ROM supine Right eval Right  02/25/22  Hip flexion 85 105  Hip extension 0 28  Hip abduction 25 43 (left 48)  Hip adduction    Hip internal rotation    Hip external rotation    Knee flexion 100 133  Knee extension 0 full  Ankle dorsiflexion 10   Ankle plantarflexion    Ankle inversion    Ankle eversion     (Blank rows = not tested)  LOWER EXTREMITY MMT:    Lt LE WFL's throughout  MMT Right eval  Left eval 10/25 02/25/22  Hip flexion 4-/5   5/5  Hip extension 3+/4   5/5  Hip abduction 3-/5   5/5  Hip adduction      Hip internal rotation      Hip external rotation      Knee flexion 4/5   4/5  Knee extension 4+/5   5/5  Ankle dorsiflexion 3-/5  3/5 3/5 limited by ROM  Ankle plantarflexion   3/5 Unable to do single leg heel raise  Ankle inversion      Ankle eversion       (Blank rows = not tested)  FUNCTIONAL TESTS:  SL Squat R - requires one UE assist and limited by ankle DF  SL Squat L - able to do with one UE assist  TODAY'S TREATMENT: [facility] Adult PT Treatment:                                                 DATE: 03/08/22 Therapeutic Exercise: Recumbent bike x 5 min L1 Heel raises x 20 each toes in, out, straight Gastroc stretch 2 x 20 sec Soleus stretch 2 x 20 sec Sidestep red TB around feet 20'x 2 - CGA for balance today Rockerboard A/P and laterally x 1 min each Leg press 3 plates 2 x 10 SLS 3 x 30 sec - intermittent UE support Tandem stance 3 x 30 sec intermittent UE support Runners step up 4'' step x 20 Lateral step up 4'' step x 20 Laps around clinic between exercises - focus improving ankle and hip alignment   [facility] Adult PT Treatment:                                                DATE: 03/03/22 Therapeutic Exercise: Recumbent bike x 5 min L1 Heel raises x 20 each toes in, out, straight Gastroc stretch 2 x 20 sec Soleus stretch 2 x 20 sec Toe raises against wall x 20 Side step red TB around feet 20' x 4 Hip 3 way with slider focus on Rt LE stance phase x 10 Leg press DL 6 plates x 10, SL 3 plates 2 x 10 Rockerboard A/P and laterally x 1 min each Tall kneeling to standing with 1 UE support x 5 focus on Rt LE strengthening   02/25/22  Nustep L6 x 5 min for warm up Heel raises x 20, second set with tennis ball between heels  Toe raises x 20 done with back against wall to avoid hip  compensation  Seated soleus stretch 2 x 30 sec   Gastroc runners stretch 2x 30 sec (attempted sitting with strap  but standing better) Prone knee flexion with YTB for full range 2x10 Sit to stand with GTB 2 x 10  PATIENT EDUCATION:  Education details: HEP update Person educated: Patient Education method: Explanation, Demonstration, Tactile cues, Verbal cues, and Handouts Education comprehension: verbalized understanding, returned demonstration, verbal cues required, tactile cues required, and needs further education   HOME EXERCISE PROGRAM: Access Code: [email or web address] URL: [email or web address] Date: 02/25/2022 Prepared by: Raynelle Fanning  Exercises - Beginner Bridge  - 2 x daily - 7 x  weekly - 1-2 sets - 10 reps - 3-5 sec  hold - Hooklying Isometric  Clamshell  - 2 x daily - 7 x weekly - 1-2 sets - 10 reps - 3 sec  hold - Ankle Dorsiflexion with Resistance  - 2 x daily - 7 x weekly - 1-2 sets - 10 reps - 2-3 sec  hold - Supine Ankle Pumps  - 2 x daily - 7 x weekly - 1-2 sets - 10 reps - 2-3 sec  hold - Seated Ankle Dorsiflexion AROM  - 2 x daily - 7 x weekly - 1-2 sets - 10 reps - 2-3 sec  hold - Sit to Stand  - 2 x daily - 7 x weekly - 1 sets - 10 reps - 3-5 sec  hold - Standing Weight Shift  - 2 x daily - 7 x weekly - 1 sets - 20-30 reps - 2-3 sec  hold - Prone Hamstring Curl with Anchored Resistance  - 1 x daily - 4 x weekly - 3 sets - 10 reps - Gastroc Stretch on Wall  - 2 x daily - 7 x weekly - 1 sets - 3 reps - 30-60 sec hold - Seated Dorsiflexion Stretch  - 2 x daily - 7 x weekly - 1 sets - 3 reps - 30 sec  hold - Heel Toe Raises with Counter Support  - 1 x daily - 7 x weekly - 2 sets - 10 reps  ASSESSMENT:  CLINICAL IMPRESSION: Pt is progressing well towards goals. He continues with decreased Rt LE strength and balance - unable to perform SLS without UE support. Pt with good tolerance to all interventions   GOALS: Goals reviewed with patient? Yes   LONG TERM GOALS: Target date: 04/08/2022   Increase strength Rt LE to 4/5 to 5/5  Baseline: 3- to 4+/5 Goal status: IN PROGRESS  2.  Increase AROM Rt LE to WFL's allowing patient to use Rt LE for all functional activities  Baseline: hip, knee and ankle limitations Goal status: IN PROGRESS  3.  Independent in gait with improved gait pattern and least restrictive assistive device  Baseline: Decreased stance time R LE and trunk lean R Goal status: IN PROGRESS  4.  Independent in all transfers and transitional movements necessary for ALD's and return to work  Baseline: unable to do floor to stand transfer from R LE Goal status: IN PROGRESS  5.  Independent in HEP  Baseline: no HeP Goal status: IN  PROGRESS   PLAN: PT FREQUENCY: 2x/week  PT DURATION: 6 weeks  PLANNED INTERVENTIONS: Therapeutic exercises, Therapeutic activity, Neuromuscular re-education, Balance training, Gait training, Patient/Family education, Self Care, Stair training, Dry Needling, Electrical stimulation, Cryotherapy, Moist heat, Ultrasound, Manual therapy, and Re-evaluation  PLAN FOR NEXT SESSION: LE strength, gait, balance   Emeril Stille, PT 03/08/2022, 2:40 PM

## 2022-03-09 DIAGNOSIS — T79A11A Traumatic compartment syndrome of right upper extremity, initial encounter: Secondary | ICD-10-CM | POA: Diagnosis not present

## 2022-03-09 DIAGNOSIS — M79641 Pain in right hand: Secondary | ICD-10-CM | POA: Diagnosis not present

## 2022-03-09 DIAGNOSIS — M79631 Pain in right forearm: Secondary | ICD-10-CM | POA: Diagnosis not present

## 2022-03-09 NOTE — Therapy (Incomplete)
OUTPATIENT PHYSICAL THERAPY LOWER EXTREMITY TREATMENT   Patient Name: Chris Parker MRN: [id number] DOB:[birth year], 31 y.o., male Today's Date: 03/08/2022   PT End of Session - 03/08/22 1439     Visit Number 6    Number of Visits 12    Date for PT Re-Evaluation 04/08/22    Authorization Time Period 02/25/22 to 04/08/22    Authorization - Visit Number 3    Authorization - Number of Visits 8    PT Start Time 1358    PT Stop Time 1436    PT Time Calculation (min) 38 min    Activity Tolerance Patient tolerated treatment well    Behavior During Therapy [facility] for tasks assessed/performed                 Past Medical History:  Diagnosis Date   IV drug abuse [facility])    Past Surgical History:  Procedure Laterality Date   FASCIECTOMY Right 01/08/2022   Procedure: FASCIECTOMY OF FOREARM AND HAND;  Surgeon: Marlyne Beards, MD;  Location: [facility];  Service: Orthopedics;  Laterality: Right;  RIGHT HAND AND FOREARM   FASCIOTOMY Right 01/09/2022   Procedure: RIGHT FOREARM FASCIOTOMY; APPLICATION OF WOUND VAC;  Surgeon: Marlyne Beards, MD;  Location: [facility];  Service: Orthopedics;  Laterality: Right;   I & D EXTREMITY Right 01/11/2022   Procedure: IRRIGATION AND DEBRIDEMENT RIGHT UPPER EXTREMITY;  Surgeon: Marlyne Beards, MD;  Location: [facility];  Service: Orthopedics;  Laterality: Right;   I & D EXTREMITY Right 01/13/2022   Procedure: IRRIGATION AND DEBRIDEMENT forearm;  Surgeon: Marlyne Beards, MD;  Location: [facility];  Service: Orthopedics;  Laterality: Right;   Patient Active Problem List   Diagnosis Date Noted   Compartment syndrome (HCC) 01/09/2022   SIRS (systemic inflammatory response syndrome) (HCC) 01/09/2022   Elevated troponin 01/09/2022   Hyperkalemia 01/09/2022   AKI (acute kidney injury) (HCC) 01/09/2022   Lactic acidosis 01/09/2022   Transaminitis 01/09/2022   IV drug abuse (HCC) 01/09/2022   Rhabdomyolysis 01/08/2022    PCP: None  REFERRING PROVIDER: Merlene Laughter DO  REFERRING DIAG: Rt LE weakness   THERAPY DIAG:  Muscle weakness (generalized)  Other symptoms and signs involving the musculoskeletal system  Other abnormalities of gait and mobility  Rationale for Evaluation and Treatment Rehabilitation  ONSET DATE: 01/01/22  SUBJECTIVE:   SUBJECTIVE STATEMENT: ***  PERTINENT HISTORY: Denies any other medical problems   PAIN:  Are you having pain? Yes: NPRS scale: 5/10 Pain location: Rt foot Pain description: aching and burning  Aggravating factors: sitting still > 30 min Relieving factors: propped up when lying down   PRECAUTIONS: Other: Rt UE compartment syndrome   PATIENT GOALS toibe able to return to work    OBJECTIVE:   LOWER EXTREMITY ROM:    Lt LE ROM WFL's throughout  Active Assistive ROM supine Right eval Right  02/25/22  Hip flexion 85 105  Hip extension 0 28  Hip abduction 25 43 (left 48)  Hip adduction    Hip internal rotation    Hip external rotation    Knee flexion 100 133  Knee extension 0 full  Ankle dorsiflexion 10   Ankle plantarflexion    Ankle inversion    Ankle eversion     (Blank rows = not tested)  LOWER EXTREMITY MMT:    Lt LE WFL's throughout  MMT Right eval Left eval 10/25 02/25/22  Hip flexion 4-/5   5/5  Hip extension 3+/4   5/5  Hip abduction 3-/5   5/5  Hip adduction      Hip internal rotation      Hip external rotation      Knee flexion 4/5   4/5  Knee extension 4+/5   5/5  Ankle dorsiflexion 3-/5  3/5 3/5 limited by ROM  Ankle plantarflexion   3/5 Unable to do single leg heel raise  Ankle inversion      Ankle eversion       (Blank rows = not tested)  FUNCTIONAL TESTS:  SL Squat R - requires one UE assist and limited by ankle DF  SL Squat L - able to do with one UE assist  TODAY'S TREATMENT:*** [facility] Adult PT Treatment:                                                DATE: 03/10/22 Therapeutic Exercise: Recumbent bike x 5 min L1   [facility] Adult PT Treatment:                                                 DATE: 03/08/22 Therapeutic Exercise: Recumbent bike x 5 min L1 Heel raises x 20 each toes in, out, straight Gastroc stretch 2 x 20 sec Soleus stretch 2 x 20 sec Sidestep red TB around feet 20'x 2 - CGA for balance today Rockerboard A/P and laterally x 1 min each Leg press 3 plates 2 x 10 SLS 3 x 30 sec - intermittent UE support Tandem stance 3 x 30 sec intermittent UE support Runners step up 4'' step x 20 Lateral step up 4'' step x 20 Laps around clinic between exercises - focus improving ankle and hip alignment   [facility] Adult PT Treatment:                                                DATE: 03/03/22 Therapeutic Exercise: Recumbent bike x 5 min L1 Heel raises x 20 each toes in, out, straight Gastroc stretch 2 x 20 sec Soleus stretch 2 x 20 sec Toe raises against wall x 20 Side step red TB around feet 20' x 4 Hip 3 way with slider focus on Rt LE stance phase x 10 Leg press DL 6 plates x 10, SL 3 plates 2 x 10 Rockerboard A/P and laterally x 1 min each Tall kneeling to standing with 1 UE support x 5 focus on Rt LE strengthening   02/25/22  Nustep L6 x 5 min for warm up Heel raises x 20, second set with tennis ball between heels  Toe raises x 20 done with back against wall to avoid hip  compensation  Seated soleus stretch 2 x 30 sec   Gastroc runners stretch 2x 30 sec (attempted sitting with strap  but standing better) Prone knee flexion with YTB for full range 2x10 Sit to stand with GTB 2 x 10  PATIENT EDUCATION:  Education details: HEP update Person educated: Patient Education method: Explanation, Demonstration, Tactile cues, Verbal cues, and Handouts Education comprehension: verbalized understanding, returned demonstration, verbal cues required, tactile cues required, and needs further education  HOME EXERCISE PROGRAM: Access Code: [email or web address] URL: [email or web address] Date: 02/25/2022 Prepared by:  Raynelle Fanning  Exercises - Beginner Bridge  - 2 x daily - 7 x weekly - 1-2 sets - 10 reps - 3-5 sec  hold - Hooklying Isometric Clamshell  - 2 x daily - 7 x weekly - 1-2 sets - 10 reps - 3 sec  hold - Ankle Dorsiflexion with Resistance  - 2 x daily - 7 x weekly - 1-2 sets - 10 reps - 2-3 sec  hold - Supine Ankle Pumps  - 2 x daily - 7 x weekly - 1-2 sets - 10 reps - 2-3 sec  hold - Seated Ankle Dorsiflexion AROM  - 2 x daily - 7 x weekly - 1-2 sets - 10 reps - 2-3 sec  hold - Sit to Stand  - 2 x daily - 7 x weekly - 1 sets - 10 reps - 3-5 sec  hold - Standing Weight Shift  - 2 x daily - 7 x weekly - 1 sets - 20-30 reps - 2-3 sec  hold - Prone Hamstring Curl with Anchored Resistance  - 1 x daily - 4 x weekly - 3 sets - 10 reps - Gastroc Stretch on Wall  - 2 x daily - 7 x weekly - 1 sets - 3 reps - 30-60 sec hold - Seated Dorsiflexion Stretch  - 2 x daily - 7 x weekly - 1 sets - 3 reps - 30 sec  hold - Heel Toe Raises with Counter Support  - 1 x daily - 7 x weekly - 2 sets - 10 reps  ASSESSMENT:  CLINICAL IMPRESSION: ***   GOALS: Goals reviewed with patient? Yes   LONG TERM GOALS: Target date: 04/08/2022   Increase strength Rt LE to 4/5 to 5/5  Baseline: 3- to 4+/5 Goal status: IN PROGRESS  2.  Increase AROM Rt LE to WFL's allowing patient to use Rt LE for all functional activities  Baseline: hip, knee and ankle limitations Goal status: IN PROGRESS  3.  Independent in gait with improved gait pattern and least restrictive assistive device  Baseline: Decreased stance time R LE and trunk lean R Goal status: IN PROGRESS  4.  Independent in all transfers and transitional movements necessary for ALD's and return to work  Baseline: unable to do floor to stand transfer from R LE Goal status: IN PROGRESS  5.  Independent in HEP  Baseline: no HeP Goal status: IN PROGRESS   PLAN: PT FREQUENCY: 2x/week  PT DURATION: 6 weeks  PLANNED INTERVENTIONS: Therapeutic exercises, Therapeutic  activity, Neuromuscular re-education, Balance training, Gait training, Patient/Family education, Self Care, Stair training, Dry Needling, Electrical stimulation, Cryotherapy, Moist heat, Ultrasound, Manual therapy, and Re-evaluation  PLAN FOR NEXT SESSION: LE strength, gait, balance   DONAWERTH,KAREN, PT 03/08/2022, 2:40 PM

## 2022-03-10 ENCOUNTER — Encounter: Payer: Medicaid Other | Admitting: Physical Therapy

## 2022-03-15 ENCOUNTER — Ambulatory Visit: Payer: Medicaid Other | Admitting: Physical Therapy

## 2022-03-15 ENCOUNTER — Encounter: Payer: Self-pay | Admitting: Physical Therapy

## 2022-03-15 DIAGNOSIS — R2689 Other abnormalities of gait and mobility: Secondary | ICD-10-CM

## 2022-03-15 DIAGNOSIS — M6281 Muscle weakness (generalized): Secondary | ICD-10-CM | POA: Diagnosis not present

## 2022-03-15 DIAGNOSIS — R29898 Other symptoms and signs involving the musculoskeletal system: Secondary | ICD-10-CM

## 2022-03-15 NOTE — Therapy (Signed)
OUTPATIENT PHYSICAL THERAPY LOWER EXTREMITY TREATMENT   Patient Name: Chris Parker MRN: [id number] DOB:1990/05/14, 31 y.o., male Today's Date: 03/15/2022   PT End of Session - 03/15/22 1403     Visit Number 7    Number of Visits 12    Date for PT Re-Evaluation 04/08/22    Authorization Time Period 02/25/22 to 04/08/22    Authorization - Visit Number 4    Authorization - Number of Visits 8    PT Start Time 1403    PT Stop Time 1445    PT Time Calculation (min) 42 min    Activity Tolerance Patient tolerated treatment well    Behavior During Therapy [facility] for tasks assessed/performed                 Past Medical History:  Diagnosis Date   IV drug abuse [facility] [place])    Past Surgical History:  Procedure Laterality Date   FASCIECTOMY Right 01/08/2022   Procedure: FASCIECTOMY OF FOREARM AND HAND;  Surgeon: Marlyne Beards, MD;  Location: [facility];  Service: Orthopedics;  Laterality: Right;  RIGHT HAND AND FOREARM   FASCIOTOMY Right 01/09/2022   Procedure: RIGHT FOREARM FASCIOTOMY; APPLICATION OF WOUND VAC;  Surgeon: Marlyne Beards, MD;  Location: [facility];  Service: Orthopedics;  Laterality: Right;   I & D EXTREMITY Right 01/11/2022   Procedure: IRRIGATION AND DEBRIDEMENT RIGHT UPPER EXTREMITY;  Surgeon: Marlyne Beards, MD;  Location: [facility];  Service: Orthopedics;  Laterality: Right;   I & D EXTREMITY Right 01/13/2022   Procedure: IRRIGATION AND DEBRIDEMENT forearm;  Surgeon: Marlyne Beards, MD;  Location: [facility];  Service: Orthopedics;  Laterality: Right;   Patient Active Problem List   Diagnosis Date Noted   Compartment syndrome (HCC) 01/09/2022   SIRS (systemic inflammatory response syndrome) (HCC) 01/09/2022   Elevated troponin 01/09/2022   Hyperkalemia 01/09/2022   AKI (acute kidney injury) (HCC) 01/09/2022   Lactic acidosis 01/09/2022   Transaminitis 01/09/2022   IV drug abuse (HCC) 01/09/2022   Rhabdomyolysis 01/08/2022    PCP: None  REFERRING PROVIDER: Merlene Laughter DO  REFERRING DIAG: Rt LE weakness   THERAPY DIAG:  Muscle weakness (generalized)  Other symptoms and signs involving the musculoskeletal system  Other abnormalities of gait and mobility  Rationale for Evaluation and Treatment Rehabilitation  ONSET DATE: 01/01/22  SUBJECTIVE:   SUBJECTIVE STATEMENT: Pt reports he's been doing well. States foot has been getting better -- nerve pain has continued to bother him.   PERTINENT HISTORY: Denies any other medical problems   PAIN:  Are you having pain? Yes: NPRS scale: 5/10 Pain location: Rt foot Pain description: aching and burning  Aggravating factors: sitting still > 30 min Relieving factors: propped up when lying down   PRECAUTIONS: Other: Rt UE compartment syndrome   PATIENT GOALS toibe able to return to work    OBJECTIVE:   LOWER EXTREMITY ROM:    Lt LE ROM WFL's throughout  Active Assistive ROM supine Right eval Right  02/25/22  Hip flexion 85 105  Hip extension 0 28  Hip abduction 25 43 (left 48)  Hip adduction    Hip internal rotation    Hip external rotation    Knee flexion 100 133  Knee extension 0 full  Ankle dorsiflexion 10   Ankle plantarflexion    Ankle inversion    Ankle eversion     (Blank rows = not tested)  LOWER EXTREMITY MMT:    Lt LE WFL's throughout  MMT  Right eval Left eval 10/25 02/25/22  Hip flexion 4-/5   5/5  Hip extension 3+/4   5/5  Hip abduction 3-/5   5/5  Hip adduction      Hip internal rotation      Hip external rotation      Knee flexion 4/5   4/5  Knee extension 4+/5   5/5  Ankle dorsiflexion 3-/5  3/5 3/5 limited by ROM  Ankle plantarflexion   3/5 Unable to do single leg heel raise  Ankle inversion      Ankle eversion       (Blank rows = not tested)  FUNCTIONAL TESTS:  SL Squat R - requires one UE assist and limited by ankle DF  SL Squat L - able to do with one UE assist  TODAY'S TREATMENT: [facility] Adult PT Treatment:                                                 DATE: 03/15/22 Therapeutic Exercise: Recumbent bike x 5 min L2 Gastroc stretch 2 x 30 sec Soleus stretch 2 x 30 sec Standing Heel raise x10 DL, 1V61 SL eccentrics Standing Toe raise 2x10 Sidestep red TB around feet 20'x 2 - CGA for balance today Captain Morgans Neuromuscular re-ed: Peroneal nerve glide supine 2x10 Backwards walking 2x20' Tandem stance 2x30 sec Tandem walking 2x10' by counter SLS 2x30 sec Gait: Heel/toe walking focusing on decreasing trunk lean on R stance phase   [facility] Adult PT Treatment:                                                DATE: 03/08/22 Therapeutic Exercise: Recumbent bike x 5 min L1 Heel raises x 20 each toes in, out, straight Gastroc stretch 2 x 20 sec Soleus stretch 2 x 20 sec Sidestep red TB around feet 20'x 2 - CGA for balance today Rockerboard A/P and laterally x 1 min each Leg press 3 plates 2 x 10 SLS 3 x 30 sec - intermittent UE support Tandem stance 3 x 30 sec intermittent UE support Runners step up 4'' step x 20 Lateral step up 4'' step x 20 Laps around clinic between exercises - focus improving ankle and hip alignment   [facility] Adult PT Treatment:                                                DATE: 03/03/22 Therapeutic Exercise: Recumbent bike x 5 min L1 Heel raises x 20 each toes in, out, straight Gastroc stretch 2 x 20 sec Soleus stretch 2 x 20 sec Toe raises against wall x 20 Side step red TB around feet 20' x 4 Hip 3 way with slider focus on Rt LE stance phase x 10 Leg press DL 6 plates x 10, SL 3 plates 2 x 10 Rockerboard A/P and laterally x 1 min each Tall kneeling to standing with 1 UE support x 5 focus on Rt LE strengthening   PATIENT EDUCATION:  Education details: HEP update Person educated: Patient Education method: Explanation, Demonstration, Tactile cues, Verbal cues, and Handouts Education  comprehension: verbalized understanding, returned demonstration, verbal cues required, tactile cues required, and  needs further education   HOME EXERCISE PROGRAM: Access Code: [email or web address] URL: [email or web address] Date: 02/25/2022 Prepared by: Raynelle Fanning  Exercises - Beginner Bridge  - 2 x daily - 7 x weekly - 1-2 sets - 10 reps - 3-5 sec  hold - Hooklying Isometric Clamshell  - 2 x daily - 7 x weekly - 1-2 sets - 10 reps - 3 sec  hold - Ankle Dorsiflexion with Resistance  - 2 x daily - 7 x weekly - 1-2 sets - 10 reps - 2-3 sec  hold - Supine Ankle Pumps  - 2 x daily - 7 x weekly - 1-2 sets - 10 reps - 2-3 sec  hold - Seated Ankle Dorsiflexion AROM  - 2 x daily - 7 x weekly - 1-2 sets - 10 reps - 2-3 sec  hold - Sit to Stand  - 2 x daily - 7 x weekly - 1 sets - 10 reps - 3-5 sec  hold - Standing Weight Shift  - 2 x daily - 7 x weekly - 1 sets - 20-30 reps - 2-3 sec  hold - Prone Hamstring Curl with Anchored Resistance  - 1 x daily - 4 x weekly - 3 sets - 10 reps - Gastroc Stretch on Wall  - 2 x daily - 7 x weekly - 1 sets - 3 reps - 30-60 sec hold - Seated Dorsiflexion Stretch  - 2 x daily - 7 x weekly - 1 sets - 3 reps - 30 sec  hold - Heel Toe Raises with Counter Support  - 1 x daily - 7 x weekly - 2 sets - 10 reps  ASSESSMENT:  CLINICAL IMPRESSION: Continuing to work on LE strengthening and stability. Pt with improving gait and balance. Initiated nerve glides to hopefully help with pt's nerve pain.    GOALS: Goals reviewed with patient? Yes   LONG TERM GOALS: Target date: 04/08/2022   Increase strength Rt LE to 4/5 to 5/5  Baseline: 3- to 4+/5 Goal status: IN PROGRESS  2.  Increase AROM Rt LE to WFL's allowing patient to use Rt LE for all functional activities  Baseline: hip, knee and ankle limitations Goal status: IN PROGRESS  3.  Independent in gait with improved gait pattern and least restrictive assistive device  Baseline: Decreased stance time R LE and trunk lean R Goal status: IN PROGRESS  4.  Independent in all transfers and transitional movements necessary for  ALD's and return to work  Baseline: unable to do floor to stand transfer from R LE Goal status: IN PROGRESS  5.  Independent in HEP  Baseline: no HeP Goal status: IN PROGRESS   PLAN: PT FREQUENCY: 2x/week  PT DURATION: 6 weeks  PLANNED INTERVENTIONS: Therapeutic exercises, Therapeutic activity, Neuromuscular re-education, Balance training, Gait training, Patient/Family education, Self Care, Stair training, Dry Needling, Electrical stimulation, Cryotherapy, Moist heat, Ultrasound, Manual therapy, and Re-evaluation  PLAN FOR NEXT SESSION: LE strength, gait, balance   Salvador Bigbee April Ma L Kaysey Berndt, PT, DPT 03/15/2022, 2:03 PM

## 2022-03-17 ENCOUNTER — Encounter: Payer: Self-pay | Admitting: Physical Therapy

## 2022-03-17 ENCOUNTER — Ambulatory Visit: Payer: Medicaid Other | Admitting: Physical Therapy

## 2022-03-17 DIAGNOSIS — R29898 Other symptoms and signs involving the musculoskeletal system: Secondary | ICD-10-CM

## 2022-03-17 DIAGNOSIS — R2689 Other abnormalities of gait and mobility: Secondary | ICD-10-CM

## 2022-03-17 DIAGNOSIS — M6281 Muscle weakness (generalized): Secondary | ICD-10-CM | POA: Diagnosis not present

## 2022-03-17 NOTE — Therapy (Signed)
OUTPATIENT PHYSICAL THERAPY LOWER EXTREMITY TREATMENT   Patient Name: Chris Parker MRN: [id number] DOB:07-30-1990, 31 y.o., male Today's Date: 03/17/2022   PT End of Session - 03/17/22 1529     Visit Number 8    Number of Visits 12    Authorization - Visit Number 5    Authorization - Number of Visits 8    PT Start Time 1456   pt arrived late   PT Stop Time 1530    PT Time Calculation (min) 34 min    Activity Tolerance Patient tolerated treatment well    Behavior During Therapy [facility] for tasks assessed/performed                  Past Medical History:  Diagnosis Date   IV drug abuse (HCC)    Past Surgical History:  Procedure Laterality Date   FASCIECTOMY Right 01/08/2022   Procedure: FASCIECTOMY OF FOREARM AND HAND;  Surgeon: Marlyne Beards, MD;  Location: [facility];  Service: Orthopedics;  Laterality: Right;  RIGHT HAND AND FOREARM   FASCIOTOMY Right 01/09/2022   Procedure: RIGHT FOREARM FASCIOTOMY; APPLICATION OF WOUND VAC;  Surgeon: Marlyne Beards, MD;  Location: [facility];  Service: Orthopedics;  Laterality: Right;   I & D EXTREMITY Right 01/11/2022   Procedure: IRRIGATION AND DEBRIDEMENT RIGHT UPPER EXTREMITY;  Surgeon: Marlyne Beards, MD;  Location: [facility];  Service: Orthopedics;  Laterality: Right;   I & D EXTREMITY Right 01/13/2022   Procedure: IRRIGATION AND DEBRIDEMENT forearm;  Surgeon: Marlyne Beards, MD;  Location: [facility];  Service: Orthopedics;  Laterality: Right;   Patient Active Problem List   Diagnosis Date Noted   Compartment syndrome (HCC) 01/09/2022   SIRS (systemic inflammatory response syndrome) (HCC) 01/09/2022   Elevated troponin 01/09/2022   Hyperkalemia 01/09/2022   AKI (acute kidney injury) (HCC) 01/09/2022   Lactic acidosis 01/09/2022   Transaminitis 01/09/2022   IV drug abuse (HCC) 01/09/2022   Rhabdomyolysis 01/08/2022    PCP: None  REFERRING PROVIDER: Merlene Laughter DO  REFERRING DIAG: Rt LE weakness   THERAPY DIAG:   Muscle weakness (generalized)  Other symptoms and signs involving the musculoskeletal system  Other abnormalities of gait and mobility  Rationale for Evaluation and Treatment Rehabilitation  ONSET DATE: 01/01/22  SUBJECTIVE:   SUBJECTIVE STATEMENT: Pt states nerve pain has decreased since doing stretches  PERTINENT HISTORY: Denies any other medical problems   PAIN:  Are you having pain? Yes: NPRS scale: 3/10 Pain location: Rt foot Pain description: aching and burning  Aggravating factors: sitting still > 30 min Relieving factors: propped up when lying down   PRECAUTIONS: Other: Rt UE compartment syndrome   PATIENT GOALS toibe able to return to work    OBJECTIVE:   LOWER EXTREMITY ROM:    Lt LE ROM WFL's throughout  Active Assistive ROM supine Right eval Right  02/25/22  Hip flexion 85 105  Hip extension 0 28  Hip abduction 25 43 (left 48)  Hip adduction    Hip internal rotation    Hip external rotation    Knee flexion 100 133  Knee extension 0 full  Ankle dorsiflexion 10   Ankle plantarflexion    Ankle inversion    Ankle eversion     (Blank rows = not tested)  LOWER EXTREMITY MMT:    Lt LE WFL's throughout  MMT Right eval Left eval 10/25 02/25/22  Hip flexion 4-/5   5/5  Hip extension 3+/4   5/5  Hip abduction 3-/5  5/5  Hip adduction      Hip internal rotation      Hip external rotation      Knee flexion 4/5   4/5  Knee extension 4+/5   5/5  Ankle dorsiflexion 3-/5  3/5 3/5 limited by ROM  Ankle plantarflexion   3/5 Unable to do single leg heel raise  Ankle inversion      Ankle eversion       (Blank rows = not tested)  FUNCTIONAL TESTS:  SL Squat R - requires one UE assist and limited by ankle DF  SL Squat L - able to do with one UE assist  TODAY'S TREATMENT: [facility] Adult PT Treatment:                                                DATE: 03/17/22 Therapeutic Exercise: Recumbent bike x 4 mins for warm up Eccentric heel raises on step x  20 Heel raises toes in, toes out x 20 each Single leg leg press 3 plates 2 x 10 Sidestep red TB around feet 20' x 2 Hip 3 way Lt LE on slider, focus on Rt stance phase Peroneal nerve glide x 10 Sit <> stand with Lt LE on 8'' step 2 x 10 Tandem walking with counter support Toe raises x 20 Tandem stance 2 x 30 sec bilat    [facility] Adult PT Treatment:                                                DATE: 03/15/22 Therapeutic Exercise: Recumbent bike x 5 min L2 Gastroc stretch 2 x 30 sec Soleus stretch 2 x 30 sec Standing Heel raise x10 DL, 3O75 SL eccentrics Standing Toe raise 2x10 Sidestep red TB around feet 20'x 2 - CGA for balance today Captain Morgans Neuromuscular re-ed: Peroneal nerve glide supine 2x10 Backwards walking 2x20' Tandem stance 2x30 sec Tandem walking 2x10' by counter SLS 2x30 sec Gait: Heel/toe walking focusing on decreasing trunk lean on R stance phase   [facility] Adult PT Treatment:                                                DATE: 03/08/22 Therapeutic Exercise: Recumbent bike x 5 min L1 Heel raises x 20 each toes in, out, straight Gastroc stretch 2 x 20 sec Soleus stretch 2 x 20 sec Sidestep red TB around feet 20'x 2 - CGA for balance today Rockerboard A/P and laterally x 1 min each Leg press 3 plates 2 x 10 SLS 3 x 30 sec - intermittent UE support Tandem stance 3 x 30 sec intermittent UE support Runners step up 4'' step x 20 Lateral step up 4'' step x 20 Laps around clinic between exercises - focus improving ankle and hip alignment   PATIENT EDUCATION:  Education details: HEP update Person educated: Patient Education method: Explanation, Demonstration, Tactile cues, Verbal cues, and Handouts Education comprehension: verbalized understanding, returned demonstration, verbal cues required, tactile cues required, and needs further education   HOME EXERCISE PROGRAM: Access Code: [email or web address] URL: [email or web address] Date:  02/25/2022 Prepared by:  Raynelle Fanning  Exercises - Beginner Bridge  - 2 x daily - 7 x weekly - 1-2 sets - 10 reps - 3-5 sec  hold - Hooklying Isometric Clamshell  - 2 x daily - 7 x weekly - 1-2 sets - 10 reps - 3 sec  hold - Ankle Dorsiflexion with Resistance  - 2 x daily - 7 x weekly - 1-2 sets - 10 reps - 2-3 sec  hold - Supine Ankle Pumps  - 2 x daily - 7 x weekly - 1-2 sets - 10 reps - 2-3 sec  hold - Seated Ankle Dorsiflexion AROM  - 2 x daily - 7 x weekly - 1-2 sets - 10 reps - 2-3 sec  hold - Sit to Stand  - 2 x daily - 7 x weekly - 1 sets - 10 reps - 3-5 sec  hold - Standing Weight Shift  - 2 x daily - 7 x weekly - 1 sets - 20-30 reps - 2-3 sec  hold - Prone Hamstring Curl with Anchored Resistance  - 1 x daily - 4 x weekly - 3 sets - 10 reps - Gastroc Stretch on Wall  - 2 x daily - 7 x weekly - 1 sets - 3 reps - 30-60 sec hold - Seated Dorsiflexion Stretch  - 2 x daily - 7 x weekly - 1 sets - 3 reps - 30 sec  hold - Heel Toe Raises with Counter Support  - 1 x daily - 7 x weekly - 2 sets - 10 reps  ASSESSMENT:  CLINICAL IMPRESSION: Pt continues to be unable to perform single leg stance or single leg squat. He has improved performance with tandem gait and tandem stance. Making slow progress towards goals   GOALS: Goals reviewed with patient? Yes   LONG TERM GOALS: Target date: 04/08/2022   Increase strength Rt LE to 4/5 to 5/5  Baseline: 3- to 4+/5 Goal status: IN PROGRESS  2.  Increase AROM Rt LE to WFL's allowing patient to use Rt LE for all functional activities  Baseline: hip, knee and ankle limitations Goal status: IN PROGRESS  3.  Independent in gait with improved gait pattern and least restrictive assistive device  Baseline: Decreased stance time R LE and trunk lean R Goal status: IN PROGRESS  4.  Independent in all transfers and transitional movements necessary for ALD's and return to work  Baseline: unable to do floor to stand transfer from R LE Goal status: IN  PROGRESS  5.  Independent in HEP  Baseline: no HeP Goal status: IN PROGRESS   PLAN: PT FREQUENCY: 2x/week  PT DURATION: 6 weeks  PLANNED INTERVENTIONS: Therapeutic exercises, Therapeutic activity, Neuromuscular re-education, Balance training, Gait training, Patient/Family education, Self Care, Stair training, Dry Needling, Electrical stimulation, Cryotherapy, Moist heat, Ultrasound, Manual therapy, and Re-evaluation  PLAN FOR NEXT SESSION: RECERT FOR INSURANCE!!! LE strength, gait, balance   Lien Lyman, PT 03/17/2022, 3:30 PM

## 2022-03-19 DIAGNOSIS — M79641 Pain in right hand: Secondary | ICD-10-CM | POA: Diagnosis not present

## 2022-03-19 DIAGNOSIS — M79631 Pain in right forearm: Secondary | ICD-10-CM | POA: Diagnosis not present

## 2022-03-19 DIAGNOSIS — T79A11A Traumatic compartment syndrome of right upper extremity, initial encounter: Secondary | ICD-10-CM | POA: Diagnosis not present

## 2022-03-22 DIAGNOSIS — M79631 Pain in right forearm: Secondary | ICD-10-CM | POA: Diagnosis not present

## 2022-03-22 DIAGNOSIS — M79641 Pain in right hand: Secondary | ICD-10-CM | POA: Diagnosis not present

## 2022-03-24 ENCOUNTER — Ambulatory Visit: Payer: Medicaid Other | Attending: Internal Medicine | Admitting: Physical Therapy

## 2022-03-24 ENCOUNTER — Encounter: Payer: Self-pay | Admitting: Physical Therapy

## 2022-03-24 DIAGNOSIS — R2689 Other abnormalities of gait and mobility: Secondary | ICD-10-CM | POA: Diagnosis not present

## 2022-03-24 DIAGNOSIS — R29898 Other symptoms and signs involving the musculoskeletal system: Secondary | ICD-10-CM | POA: Diagnosis not present

## 2022-03-24 DIAGNOSIS — M6281 Muscle weakness (generalized): Secondary | ICD-10-CM | POA: Insufficient documentation

## 2022-03-24 NOTE — Therapy (Addendum)
OUTPATIENT PHYSICAL THERAPY LOWER EXTREMITY TREATMENT AND D/C   Patient Name: Chris Parker MRN: [id number] DOB:1990/12/25, 31 y.o., male Today's Date: 03/24/2022  PHYSICAL THERAPY DISCHARGE SUMMARY  Visits from Start of Care: 9  Current functional level related to goals / functional outcomes: See below   Remaining deficits: See below   Education / Equipment: See below   Patient agrees to discharge. Patient goals were not met. Patient is being discharged due to not returning since the last visit.    PT End of Session - 03/24/22 1021     Visit Number 9    Number of Visits 12    Authorization - Number of Visits 8    PT Start Time 1022    Activity Tolerance Patient tolerated treatment well    Behavior During Therapy WFL for tasks assessed/performed                  Past Medical History:  Diagnosis Date   IV drug abuse ([place])    Past Surgical History:  Procedure Laterality Date   FASCIECTOMY Right 01/08/2022   Procedure: FASCIECTOMY OF FOREARM AND HAND;  Surgeon: Sherilyn Cooter, MD;  Location: [facility];  Service: Orthopedics;  Laterality: Right;  RIGHT HAND AND FOREARM   FASCIOTOMY Right 01/09/2022   Procedure: RIGHT FOREARM FASCIOTOMY; APPLICATION OF WOUND VAC;  Surgeon: Sherilyn Cooter, MD;  Location: [facility];  Service: Orthopedics;  Laterality: Right;   I & D EXTREMITY Right 01/11/2022   Procedure: IRRIGATION AND DEBRIDEMENT RIGHT UPPER EXTREMITY;  Surgeon: Sherilyn Cooter, MD;  Location: [facility];  Service: Orthopedics;  Laterality: Right;   I & D EXTREMITY Right 01/13/2022   Procedure: IRRIGATION AND DEBRIDEMENT forearm;  Surgeon: Sherilyn Cooter, MD;  Location: [facility];  Service: Orthopedics;  Laterality: Right;   Patient Active Problem List   Diagnosis Date Noted   Compartment syndrome ([place]) 01/09/2022   SIRS (systemic inflammatory response syndrome) ([place]) 01/09/2022   Elevated troponin 01/09/2022   Hyperkalemia 01/09/2022   AKI (acute kidney injury)  ([place]) 01/09/2022   Lactic acidosis 01/09/2022   Transaminitis 01/09/2022   IV drug abuse ([place]) 01/09/2022   Rhabdomyolysis 01/08/2022    PCP: None  REFERRING PROVIDER: Kerney Elbe DO  REFERRING DIAG: Rt LE weakness   THERAPY DIAG:  Muscle weakness (generalized)  Other symptoms and signs involving the musculoskeletal system  Other abnormalities of gait and mobility  Rationale for Evaluation and Treatment Rehabilitation  ONSET DATE: 01/01/22  SUBJECTIVE:   SUBJECTIVE STATEMENT: Pt reports nothing new or different. Has continued to do his exercises for his leg and hand.   PERTINENT HISTORY: Denies any other medical problems   PAIN:  Are you having pain? Yes: NPRS scale: 0/10 "Nothing except the nerve" Pain location: Rt foot Pain description: aching and burning  Aggravating factors: sitting still > 30 min Relieving factors: propped up when lying down   PRECAUTIONS: Other: Rt UE compartment syndrome   PATIENT GOALS toibe able to return to work    OBJECTIVE:   LOWER EXTREMITY ROM:    Lt LE ROM WFL's throughout  Active Assistive ROM supine Right eval Right  02/25/22 Right 03/24/22  Hip flexion 85 105 110 AROM  Hip extension 0 28 30  Hip abduction 25 43 (left 48) 40 AROM  Hip adduction     Hip internal rotation     Hip external rotation     Knee flexion 100 133   Knee extension 0 full   Ankle dorsiflexion 10  Ankle plantarflexion     Ankle inversion     Ankle eversion      (Blank rows = not tested)  LOWER EXTREMITY MMT:    Lt LE WFL's throughout  MMT Right eval Left eval 10/25 02/25/22 03/24/22 Right  Hip flexion 4-/5   5/5   Hip extension 3+/4   5/5   Hip abduction 3-/5   5/5   Hip adduction       Hip internal rotation       Hip external rotation       Knee flexion 4/5   4/5 4/5  Knee extension 4+/5   5/5   Ankle dorsiflexion 3-/5  3/5 3/5 limited by ROM 4-/5  Ankle plantarflexion   3/5 Unable to do single leg heel raise 50% as high SL  heel raise vs DL  Ankle inversion     4  Ankle eversion     4   (Blank rows = not tested)  FUNCTIONAL TESTS:  SL Squat R - requires one UE assist and limited by ankle DF  SL Squat L - able to do with one UE assist   03/24/22  SL squat R requires one UE assist  with increased knee instability  SL squat L able to perform without UE support  SLS: R 3 sec, L at least 1 min  TODAY'S TREATMENT: [facility] Adult PT Treatment:                                                DATE: 03/24/22 Therapeutic Exercise: Nustep L6 x 5 min L UE and bilat LEs Standing DL heel raise 2x10 Standing SL eccentric heel raise x10 Standing on 4" step eccentric step down 3x10 Standing quad stretch x30 sec Standing hamstring stretch x30 sec Standing gastroc stretch on step x30 sec Sitting ankle DF green TB 2x10 Neuromuscular re-ed: Tandem stance 2x30 sec on R Standing rockerboard x10 DL, x10 SL A/P, static 2x30 sec Standing rockerboard x10 laterally; static 2x30 sec    [facility] Adult PT Treatment:                                                DATE: 03/17/22 Therapeutic Exercise: Recumbent bike x 4 mins for warm up Eccentric heel raises on step x 20 Heel raises toes in, toes out x 20 each Single leg leg press 3 plates 2 x 10 Sidestep red TB around feet 20' x 2 Hip 3 way Lt LE on slider, focus on Rt stance phase Peroneal nerve glide x 10 Sit <> stand with Lt LE on 8'' step 2 x 10 Tandem walking with counter support Toe raises x 20 Tandem stance 2 x 30 sec bilat     PATIENT EDUCATION:  Education details: HEP update Person educated: Patient Education method: Explanation, Demonstration, Tactile cues, Verbal cues, and Handouts Education comprehension: verbalized understanding, returned demonstration, verbal cues required, tactile cues required, and needs further education   HOME EXERCISE PROGRAM: Access Code: [email or web address] URL: [email or web address] Date: 02/25/2022 Prepared by:  Almyra Free  Exercises - Beginner Bridge  - 2 x daily - 7 x weekly - 1-2 sets - 10 reps - 3-5 sec  hold - Hooklying Isometric Clamshell  - 2 x  daily - 7 x weekly - 1-2 sets - 10 reps - 3 sec  hold - Ankle Dorsiflexion with Resistance  - 2 x daily - 7 x weekly - 1-2 sets - 10 reps - 2-3 sec  hold - Supine Ankle Pumps  - 2 x daily - 7 x weekly - 1-2 sets - 10 reps - 2-3 sec  hold - Seated Ankle Dorsiflexion AROM  - 2 x daily - 7 x weekly - 1-2 sets - 10 reps - 2-3 sec  hold - Sit to Stand  - 2 x daily - 7 x weekly - 1 sets - 10 reps - 3-5 sec  hold - Standing Weight Shift  - 2 x daily - 7 x weekly - 1 sets - 20-30 reps - 2-3 sec  hold - Prone Hamstring Curl with Anchored Resistance  - 1 x daily - 4 x weekly - 3 sets - 10 reps - Gastroc Stretch on Wall  - 2 x daily - 7 x weekly - 1 sets - 3 reps - 30-60 sec hold - Seated Dorsiflexion Stretch  - 2 x daily - 7 x weekly - 1 sets - 3 reps - 30 sec  hold - Heel Toe Raises with Counter Support  - 1 x daily - 7 x weekly - 2 sets - 10 reps  ASSESSMENT:  CLINICAL IMPRESSION: Pt continues to make good progress towards his goals. Pt has met LTG #4; however, his ankle strength is still not to goal level. Pt's greatest ROM limitation is his hip abduction. Nerve pain has been improving but still present. Pt would benefit from continued PT for return to work. Encouraged pt to focus on heel raises, hamstring curls, and ankle DF at home with green TB (only had yellow).    GOALS: Goals reviewed with patient? Yes   PRIOR LONG TERM GOALS:    Increase strength Rt LE to 4/5 to 5/5  Baseline: 3- to 4+/5 Goal status: IN PROGRESS  2.  Increase AROM Rt LE to WFL's allowing patient to use Rt LE for all functional activities  Baseline: hip, knee and ankle limitations Goal status: IN PROGRESS  3.  Independent in gait with improved gait pattern and least restrictive assistive device  Baseline: Decreased stance time R LE and trunk lean R Goal status: IN PROGRESS  4.   Independent in all transfers and transitional movements necessary for ALD's and return to work  Baseline: Able to perform floor t/f with L UE assist Goal status: MET   5.  Independent in HEP  Baseline: no HeP Goal status: IN PROGRESS  REVISED LONG TERM GOALS: Date: 05/05/2022   Increase strength Rt LE to 4/5 to 5/5  Baseline: 3- to 4+/5 Goal status: IN PROGRESS  2.  Increase AROM Rt LE to WFL's allowing patient to use Rt LE for all functional activities  Baseline: hip, knee and ankle limitations Goal status: IN PROGRESS  3.  Independent in gait with improved gait pattern and least restrictive assistive device  Baseline: Decreased stance time R LE and trunk lean R Goal status: IN PROGRESS  4.  Pt will be able to stand SLS on R LE x 20 sec to demo improved stability Baseline: 3 sec Goal status: NEW  5.  Independent in HEP  Baseline: no HeP Goal status: IN PROGRESS  PLAN: PT FREQUENCY: 2x/week  PT DURATION: 6 weeks  PLANNED INTERVENTIONS: Therapeutic exercises, Therapeutic activity, Neuromuscular re-education, Balance training, Gait training, Patient/Family education, Self Care,  Stair training, Dry Needling, Electrical stimulation, Cryotherapy, Moist heat, Ultrasound, Manual therapy, and Re-evaluation  PLAN FOR NEXT SESSION: LE strength focus on ankle DF/PF, hamstrings/single leg squat, gait, balance   Asma Boldon April Ma L Jemario Poitras, PT, DPT 03/24/2022, 10:22 AM

## 2022-03-25 DIAGNOSIS — M79631 Pain in right forearm: Secondary | ICD-10-CM | POA: Diagnosis not present

## 2022-03-25 DIAGNOSIS — M79641 Pain in right hand: Secondary | ICD-10-CM | POA: Diagnosis not present

## 2022-03-31 DIAGNOSIS — T79A11A Traumatic compartment syndrome of right upper extremity, initial encounter: Secondary | ICD-10-CM | POA: Diagnosis not present

## 2022-04-01 ENCOUNTER — Ambulatory Visit: Payer: Medicaid Other | Admitting: Physical Therapy

## 2022-04-02 DIAGNOSIS — T79A11A Traumatic compartment syndrome of right upper extremity, initial encounter: Secondary | ICD-10-CM | POA: Diagnosis not present

## 2022-04-05 DIAGNOSIS — M25641 Stiffness of right hand, not elsewhere classified: Secondary | ICD-10-CM | POA: Diagnosis not present

## 2022-04-05 DIAGNOSIS — M79641 Pain in right hand: Secondary | ICD-10-CM | POA: Diagnosis not present

## 2022-04-05 DIAGNOSIS — M25631 Stiffness of right wrist, not elsewhere classified: Secondary | ICD-10-CM | POA: Diagnosis not present

## 2022-04-05 DIAGNOSIS — M79631 Pain in right forearm: Secondary | ICD-10-CM | POA: Diagnosis not present

## 2022-04-06 ENCOUNTER — Ambulatory Visit: Payer: Medicaid Other | Admitting: Physical Therapy

## 2022-04-06 DIAGNOSIS — T79A11A Traumatic compartment syndrome of right upper extremity, initial encounter: Secondary | ICD-10-CM | POA: Diagnosis not present

## 2022-04-08 DIAGNOSIS — T79A11A Traumatic compartment syndrome of right upper extremity, initial encounter: Secondary | ICD-10-CM | POA: Diagnosis not present

## 2022-04-13 DIAGNOSIS — T79A11A Traumatic compartment syndrome of right upper extremity, initial encounter: Secondary | ICD-10-CM | POA: Diagnosis not present

## 2022-04-16 DIAGNOSIS — T79A11A Traumatic compartment syndrome of right upper extremity, initial encounter: Secondary | ICD-10-CM | POA: Diagnosis not present

## 2022-04-20 DIAGNOSIS — T79A11A Traumatic compartment syndrome of right upper extremity, initial encounter: Secondary | ICD-10-CM | POA: Diagnosis not present

## 2022-04-23 DIAGNOSIS — M25631 Stiffness of right wrist, not elsewhere classified: Secondary | ICD-10-CM | POA: Diagnosis not present

## 2022-04-23 DIAGNOSIS — M79631 Pain in right forearm: Secondary | ICD-10-CM | POA: Diagnosis not present

## 2022-04-23 DIAGNOSIS — M25641 Stiffness of right hand, not elsewhere classified: Secondary | ICD-10-CM | POA: Diagnosis not present

## 2022-04-23 DIAGNOSIS — M79641 Pain in right hand: Secondary | ICD-10-CM | POA: Diagnosis not present

## 2022-04-27 DIAGNOSIS — T79A11A Traumatic compartment syndrome of right upper extremity, initial encounter: Secondary | ICD-10-CM | POA: Diagnosis not present

## 2022-04-30 DIAGNOSIS — T79A11A Traumatic compartment syndrome of right upper extremity, initial encounter: Secondary | ICD-10-CM | POA: Diagnosis not present

## 2022-05-17 DIAGNOSIS — M79641 Pain in right hand: Secondary | ICD-10-CM | POA: Diagnosis not present

## 2022-05-17 DIAGNOSIS — M25641 Stiffness of right hand, not elsewhere classified: Secondary | ICD-10-CM | POA: Diagnosis not present

## 2022-05-17 DIAGNOSIS — M25631 Stiffness of right wrist, not elsewhere classified: Secondary | ICD-10-CM | POA: Diagnosis not present

## 2022-05-17 DIAGNOSIS — T79A11A Traumatic compartment syndrome of right upper extremity, initial encounter: Secondary | ICD-10-CM | POA: Diagnosis not present

## 2022-05-17 DIAGNOSIS — M79631 Pain in right forearm: Secondary | ICD-10-CM | POA: Diagnosis not present

## 2022-05-20 DIAGNOSIS — M79641 Pain in right hand: Secondary | ICD-10-CM | POA: Diagnosis not present

## 2022-05-20 DIAGNOSIS — T79A11A Traumatic compartment syndrome of right upper extremity, initial encounter: Secondary | ICD-10-CM | POA: Diagnosis not present

## 2022-05-20 DIAGNOSIS — M792 Neuralgia and neuritis, unspecified: Secondary | ICD-10-CM | POA: Diagnosis not present

## 2022-05-25 ENCOUNTER — Telehealth: Payer: Self-pay

## 2022-05-25 DIAGNOSIS — T79A11A Traumatic compartment syndrome of right upper extremity, initial encounter: Secondary | ICD-10-CM | POA: Diagnosis not present

## 2022-05-25 NOTE — Telephone Encounter (Signed)
Mychart msg sent. AS, CMA 

## 2022-05-28 DIAGNOSIS — T79A11A Traumatic compartment syndrome of right upper extremity, initial encounter: Secondary | ICD-10-CM | POA: Diagnosis not present

## 2022-06-01 DIAGNOSIS — T79A11A Traumatic compartment syndrome of right upper extremity, initial encounter: Secondary | ICD-10-CM | POA: Diagnosis not present

## 2022-06-08 DIAGNOSIS — M25631 Stiffness of right wrist, not elsewhere classified: Secondary | ICD-10-CM | POA: Diagnosis not present

## 2022-06-08 DIAGNOSIS — M25641 Stiffness of right hand, not elsewhere classified: Secondary | ICD-10-CM | POA: Diagnosis not present

## 2022-06-08 DIAGNOSIS — T79A11A Traumatic compartment syndrome of right upper extremity, initial encounter: Secondary | ICD-10-CM | POA: Diagnosis not present

## 2022-06-08 DIAGNOSIS — M79631 Pain in right forearm: Secondary | ICD-10-CM | POA: Diagnosis not present

## 2022-06-08 DIAGNOSIS — M79641 Pain in right hand: Secondary | ICD-10-CM | POA: Diagnosis not present

## 2022-06-14 DIAGNOSIS — T79A11A Traumatic compartment syndrome of right upper extremity, initial encounter: Secondary | ICD-10-CM | POA: Diagnosis not present

## 2022-06-21 DIAGNOSIS — T79A11A Traumatic compartment syndrome of right upper extremity, initial encounter: Secondary | ICD-10-CM | POA: Diagnosis not present

## 2022-06-28 DIAGNOSIS — T79A11A Traumatic compartment syndrome of right upper extremity, initial encounter: Secondary | ICD-10-CM | POA: Diagnosis not present

## 2022-06-28 DIAGNOSIS — M79641 Pain in right hand: Secondary | ICD-10-CM | POA: Diagnosis not present

## 2022-07-05 DIAGNOSIS — M79631 Pain in right forearm: Secondary | ICD-10-CM | POA: Diagnosis not present

## 2022-07-05 DIAGNOSIS — T79A11A Traumatic compartment syndrome of right upper extremity, initial encounter: Secondary | ICD-10-CM | POA: Diagnosis not present

## 2022-07-05 DIAGNOSIS — M25641 Stiffness of right hand, not elsewhere classified: Secondary | ICD-10-CM | POA: Diagnosis not present

## 2022-07-05 DIAGNOSIS — M25631 Stiffness of right wrist, not elsewhere classified: Secondary | ICD-10-CM | POA: Diagnosis not present

## 2022-07-05 DIAGNOSIS — M79641 Pain in right hand: Secondary | ICD-10-CM | POA: Diagnosis not present

## 2022-07-16 DIAGNOSIS — M79641 Pain in right hand: Secondary | ICD-10-CM | POA: Diagnosis not present

## 2022-07-16 DIAGNOSIS — T79A11A Traumatic compartment syndrome of right upper extremity, initial encounter: Secondary | ICD-10-CM | POA: Diagnosis not present

## 2022-07-19 DIAGNOSIS — T79A11A Traumatic compartment syndrome of right upper extremity, initial encounter: Secondary | ICD-10-CM | POA: Diagnosis not present

## 2022-07-19 DIAGNOSIS — M79641 Pain in right hand: Secondary | ICD-10-CM | POA: Diagnosis not present

## 2022-07-19 DIAGNOSIS — M792 Neuralgia and neuritis, unspecified: Secondary | ICD-10-CM | POA: Diagnosis not present

## 2022-07-20 DIAGNOSIS — N492 Inflammatory disorders of scrotum: Secondary | ICD-10-CM | POA: Diagnosis not present

## 2022-07-30 DIAGNOSIS — T79A11A Traumatic compartment syndrome of right upper extremity, initial encounter: Secondary | ICD-10-CM | POA: Diagnosis not present

## 2022-07-30 DIAGNOSIS — M79641 Pain in right hand: Secondary | ICD-10-CM | POA: Diagnosis not present

## 2022-08-03 DIAGNOSIS — T79A11A Traumatic compartment syndrome of right upper extremity, initial encounter: Secondary | ICD-10-CM | POA: Diagnosis not present

## 2022-08-03 DIAGNOSIS — M79641 Pain in right hand: Secondary | ICD-10-CM | POA: Diagnosis not present

## 2022-08-10 DIAGNOSIS — T79A11A Traumatic compartment syndrome of right upper extremity, initial encounter: Secondary | ICD-10-CM | POA: Diagnosis not present

## 2022-08-10 DIAGNOSIS — M79641 Pain in right hand: Secondary | ICD-10-CM | POA: Diagnosis not present

## 2022-08-17 DIAGNOSIS — M25641 Stiffness of right hand, not elsewhere classified: Secondary | ICD-10-CM | POA: Diagnosis not present

## 2022-08-17 DIAGNOSIS — M79631 Pain in right forearm: Secondary | ICD-10-CM | POA: Diagnosis not present

## 2022-08-17 DIAGNOSIS — M79641 Pain in right hand: Secondary | ICD-10-CM | POA: Diagnosis not present

## 2022-08-17 DIAGNOSIS — T79A11A Traumatic compartment syndrome of right upper extremity, initial encounter: Secondary | ICD-10-CM | POA: Diagnosis not present

## 2022-08-17 DIAGNOSIS — M25631 Stiffness of right wrist, not elsewhere classified: Secondary | ICD-10-CM | POA: Diagnosis not present

## 2022-09-07 DIAGNOSIS — M79641 Pain in right hand: Secondary | ICD-10-CM | POA: Diagnosis not present

## 2022-09-07 DIAGNOSIS — T79A11A Traumatic compartment syndrome of right upper extremity, initial encounter: Secondary | ICD-10-CM | POA: Diagnosis not present

## 2022-09-16 DIAGNOSIS — T79A11A Traumatic compartment syndrome of right upper extremity, initial encounter: Secondary | ICD-10-CM | POA: Diagnosis not present

## 2022-09-16 DIAGNOSIS — M79641 Pain in right hand: Secondary | ICD-10-CM | POA: Diagnosis not present

## 2022-09-30 DIAGNOSIS — M79641 Pain in right hand: Secondary | ICD-10-CM | POA: Diagnosis not present

## 2022-09-30 DIAGNOSIS — T79A11A Traumatic compartment syndrome of right upper extremity, initial encounter: Secondary | ICD-10-CM | POA: Diagnosis not present

## 2022-10-08 DIAGNOSIS — T79A11A Traumatic compartment syndrome of right upper extremity, initial encounter: Secondary | ICD-10-CM | POA: Diagnosis not present

## 2022-10-08 DIAGNOSIS — M79631 Pain in right forearm: Secondary | ICD-10-CM | POA: Diagnosis not present

## 2022-10-08 DIAGNOSIS — M79641 Pain in right hand: Secondary | ICD-10-CM | POA: Diagnosis not present

## 2022-10-26 DIAGNOSIS — T79A11A Traumatic compartment syndrome of right upper extremity, initial encounter: Secondary | ICD-10-CM | POA: Diagnosis not present

## 2022-10-26 DIAGNOSIS — M79641 Pain in right hand: Secondary | ICD-10-CM | POA: Diagnosis not present

## 2022-10-26 DIAGNOSIS — M792 Neuralgia and neuritis, unspecified: Secondary | ICD-10-CM | POA: Diagnosis not present

## 2022-10-29 DIAGNOSIS — M79641 Pain in right hand: Secondary | ICD-10-CM | POA: Diagnosis not present

## 2022-10-29 DIAGNOSIS — M25631 Stiffness of right wrist, not elsewhere classified: Secondary | ICD-10-CM | POA: Diagnosis not present

## 2022-10-29 DIAGNOSIS — M25641 Stiffness of right hand, not elsewhere classified: Secondary | ICD-10-CM | POA: Diagnosis not present

## 2022-10-29 DIAGNOSIS — T79A11A Traumatic compartment syndrome of right upper extremity, initial encounter: Secondary | ICD-10-CM | POA: Diagnosis not present

## 2022-10-29 DIAGNOSIS — M79631 Pain in right forearm: Secondary | ICD-10-CM | POA: Diagnosis not present

## 2022-11-05 DIAGNOSIS — M25631 Stiffness of right wrist, not elsewhere classified: Secondary | ICD-10-CM | POA: Diagnosis not present

## 2022-11-05 DIAGNOSIS — M79641 Pain in right hand: Secondary | ICD-10-CM | POA: Diagnosis not present

## 2022-11-05 DIAGNOSIS — M79631 Pain in right forearm: Secondary | ICD-10-CM | POA: Diagnosis not present

## 2022-11-05 DIAGNOSIS — T79A11A Traumatic compartment syndrome of right upper extremity, initial encounter: Secondary | ICD-10-CM | POA: Diagnosis not present

## 2022-11-05 DIAGNOSIS — M25641 Stiffness of right hand, not elsewhere classified: Secondary | ICD-10-CM | POA: Diagnosis not present

## 2022-11-12 DIAGNOSIS — M25641 Stiffness of right hand, not elsewhere classified: Secondary | ICD-10-CM | POA: Diagnosis not present

## 2022-11-12 DIAGNOSIS — T79A11A Traumatic compartment syndrome of right upper extremity, initial encounter: Secondary | ICD-10-CM | POA: Diagnosis not present

## 2022-11-12 DIAGNOSIS — M25631 Stiffness of right wrist, not elsewhere classified: Secondary | ICD-10-CM | POA: Diagnosis not present

## 2022-11-12 DIAGNOSIS — M79641 Pain in right hand: Secondary | ICD-10-CM | POA: Diagnosis not present

## 2022-11-12 DIAGNOSIS — M79631 Pain in right forearm: Secondary | ICD-10-CM | POA: Diagnosis not present

## 2022-11-16 DIAGNOSIS — T79A11A Traumatic compartment syndrome of right upper extremity, initial encounter: Secondary | ICD-10-CM | POA: Diagnosis not present

## 2023-02-24 DIAGNOSIS — M792 Neuralgia and neuritis, unspecified: Secondary | ICD-10-CM | POA: Diagnosis not present

## 2023-02-24 DIAGNOSIS — T79A11A Traumatic compartment syndrome of right upper extremity, initial encounter: Secondary | ICD-10-CM | POA: Diagnosis not present

## 2023-02-24 DIAGNOSIS — M79641 Pain in right hand: Secondary | ICD-10-CM | POA: Diagnosis not present

## 2023-04-08 DIAGNOSIS — L03317 Cellulitis of buttock: Secondary | ICD-10-CM | POA: Diagnosis not present

## 2023-06-07 DIAGNOSIS — M792 Neuralgia and neuritis, unspecified: Secondary | ICD-10-CM | POA: Diagnosis not present

## 2023-06-07 DIAGNOSIS — T79A11A Traumatic compartment syndrome of right upper extremity, initial encounter: Secondary | ICD-10-CM | POA: Diagnosis not present

## 2023-06-07 DIAGNOSIS — M79641 Pain in right hand: Secondary | ICD-10-CM | POA: Diagnosis not present
